# Patient Record
Sex: Male | Born: 1940
Health system: Southern US, Community
[De-identification: ages and names within clinical notes are randomized; demographics above are authoritative.]

## PROBLEM LIST (undated history)

## (undated) DIAGNOSIS — E785 Hyperlipidemia, unspecified: Secondary | ICD-10-CM

## (undated) DIAGNOSIS — Z86010 Personal history of colonic polyps: Secondary | ICD-10-CM

## (undated) DIAGNOSIS — M109 Gout, unspecified: Secondary | ICD-10-CM

## (undated) DIAGNOSIS — H269 Unspecified cataract: Secondary | ICD-10-CM

## (undated) DIAGNOSIS — K5790 Diverticulosis of intestine, part unspecified, without perforation or abscess without bleeding: Secondary | ICD-10-CM

## (undated) DIAGNOSIS — I10 Essential (primary) hypertension: Secondary | ICD-10-CM

## (undated) DIAGNOSIS — E079 Disorder of thyroid, unspecified: Secondary | ICD-10-CM

## (undated) DIAGNOSIS — R972 Elevated prostate specific antigen [PSA]: Secondary | ICD-10-CM

## (undated) DIAGNOSIS — R7303 Prediabetes: Secondary | ICD-10-CM

## (undated) DIAGNOSIS — N400 Enlarged prostate without lower urinary tract symptoms: Secondary | ICD-10-CM

## (undated) DIAGNOSIS — D369 Benign neoplasm, unspecified site: Secondary | ICD-10-CM

## (undated) DIAGNOSIS — K429 Umbilical hernia without obstruction or gangrene: Secondary | ICD-10-CM

## (undated) HISTORY — DX: Diverticulosis of intestine, part unspecified, without perforation or abscess without bleeding: K57.90

## (undated) HISTORY — PX: SHOULDER ARTHROSCOPY: SHX128

## (undated) HISTORY — DX: Elevated prostate specific antigen (PSA): R97.20

## (undated) HISTORY — DX: Prediabetes: R73.03

## (undated) HISTORY — DX: Umbilical hernia without obstruction or gangrene: K42.9

## (undated) HISTORY — DX: Unspecified cataract: H26.9

## (undated) HISTORY — DX: Disorder of thyroid, unspecified: E07.9

## (undated) HISTORY — DX: Benign prostatic hyperplasia without lower urinary tract symptoms: N40.0

## (undated) HISTORY — DX: Essential (primary) hypertension: I10

## (undated) HISTORY — DX: Personal history of colonic polyps: Z86.010

## (undated) HISTORY — DX: Hyperlipidemia, unspecified: E78.5

## (undated) HISTORY — PX: COLONOSCOPY W/ BIOPSIES: SHX1374

## (undated) HISTORY — DX: Gout, unspecified: M10.9

## (undated) HISTORY — DX: Benign neoplasm, unspecified site: D36.9

---

## 1999-04-22 ENCOUNTER — Encounter: Payer: Self-pay | Admitting: Emergency Medicine

## 1999-04-22 ENCOUNTER — Emergency Department (HOSPITAL_COMMUNITY): Admission: EM | Admit: 1999-04-22 | Discharge: 1999-04-22 | Payer: Self-pay | Admitting: Emergency Medicine

## 2003-02-17 DIAGNOSIS — D369 Benign neoplasm, unspecified site: Secondary | ICD-10-CM

## 2003-02-17 HISTORY — DX: Benign neoplasm, unspecified site: D36.9

## 2003-11-04 ENCOUNTER — Emergency Department (HOSPITAL_COMMUNITY): Admission: EM | Admit: 2003-11-04 | Discharge: 2003-11-04 | Payer: Self-pay | Admitting: Emergency Medicine

## 2003-11-13 ENCOUNTER — Encounter: Admission: RE | Admit: 2003-11-13 | Discharge: 2003-11-13 | Payer: Self-pay | Admitting: Family Medicine

## 2005-03-26 IMAGING — CT CT HEAD W/ CM
1 series · 16 of 28 positions shown, 20 images · IV contrast (75 ML OMNI 300)
Comparison: none

CLINICAL DATA: Dizziness, episode of slurred speech and right sided weakness.
 CT BRAIN WITH CONTRAST
 The patient was done with contrast ? 75 cc of Omnipaque 300.  It is compared to an unenhanced CT dated 11/04/03 from[HOSPITAL].
 Only normal vascular structures appear to enhance.  No evidence for CVA or focal lesion.    If symptoms continue, one might consider an enhanced MRI scan of the brain to rule out occult lesions that are not visible by CT. 
 IMPRESSION
 Normal enhanced CT of the brain.

[Series 2: brain · axial · 0.49mm/px · z∈[+43,+174]mm · 16 of 28 slices shown, 20 images]
[im 2/28  brain]
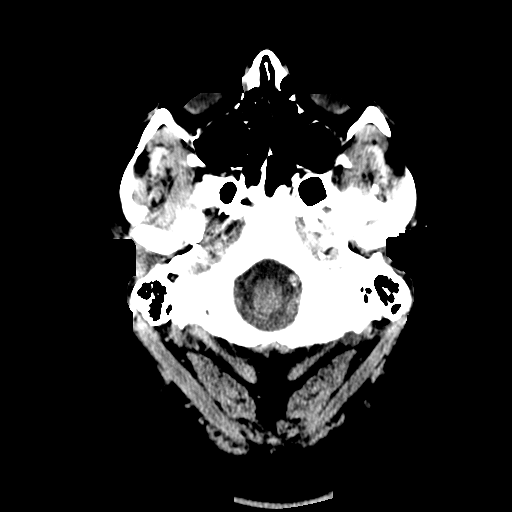
[im 2/28  bone]
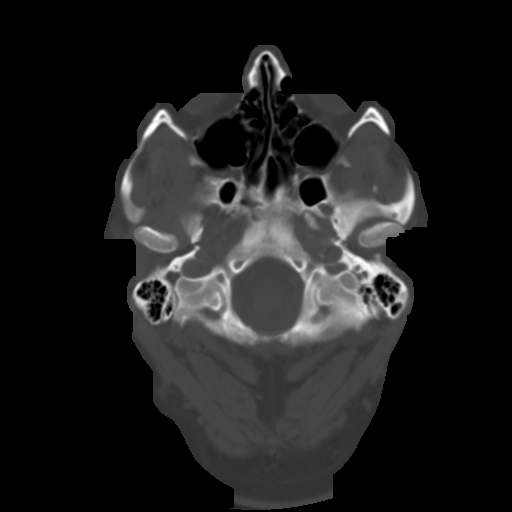
[im 4/28  brain]
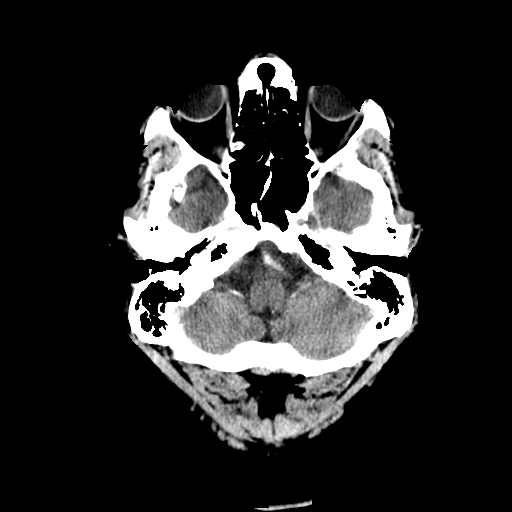
[im 6/28  brain]
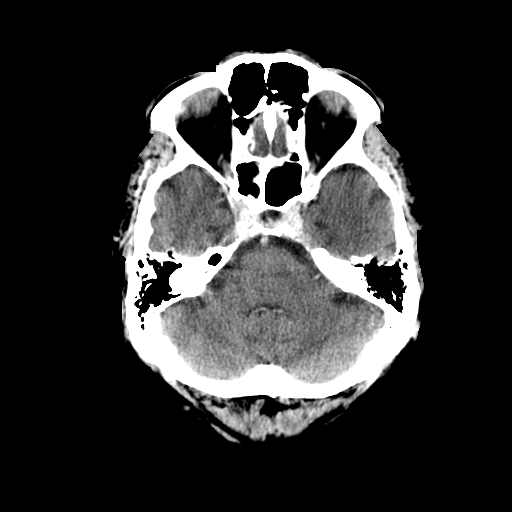
[im 7/28  brain]
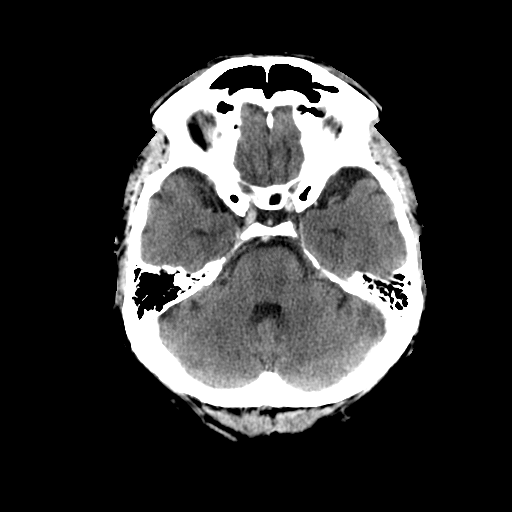
[im 9/28  brain]
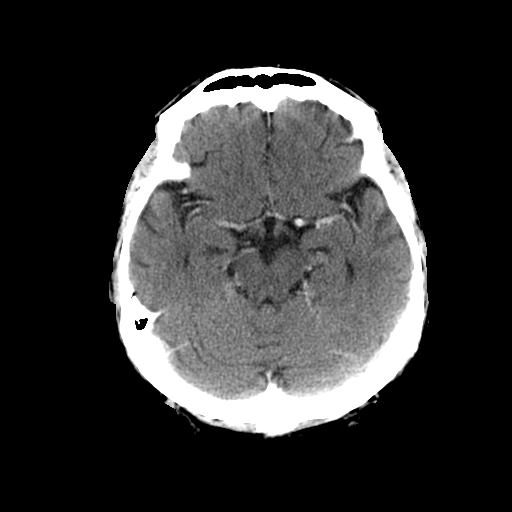
[im 9/28  bone]
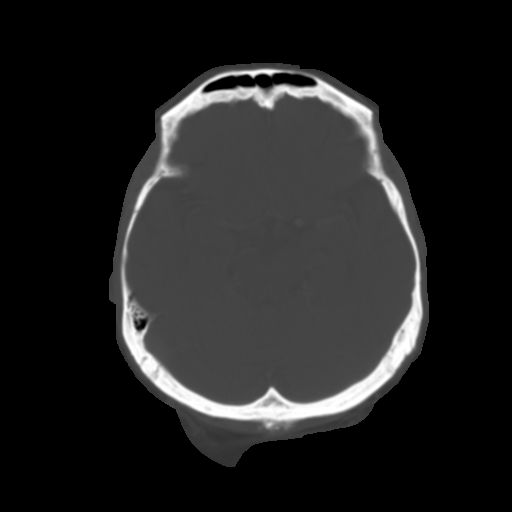
[im 10/28  brain]
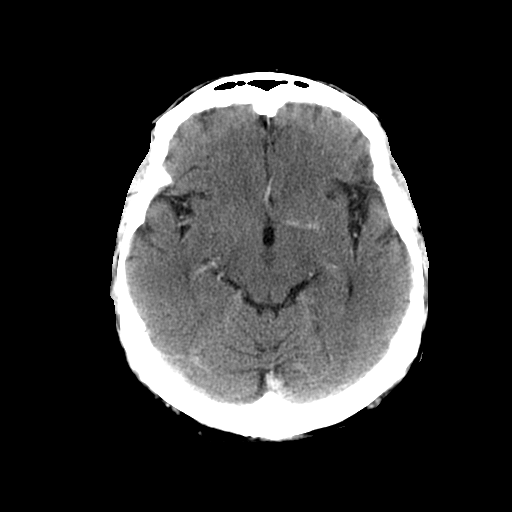
[im 12/28  brain]
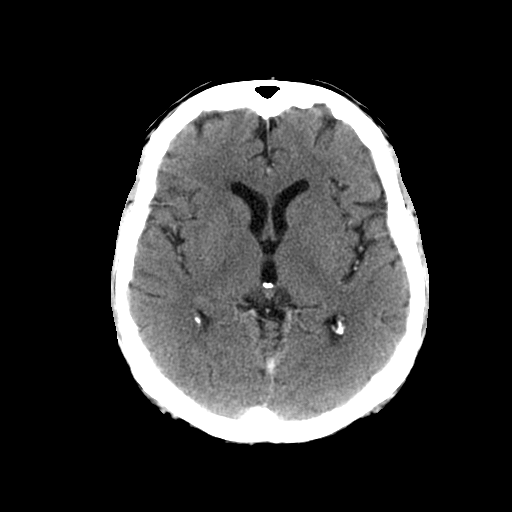
[im 14/28  brain]
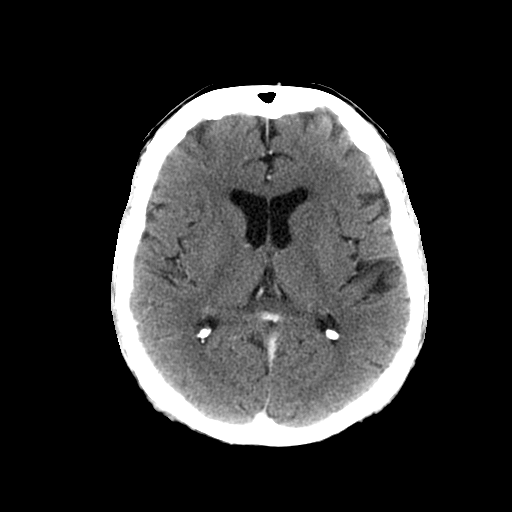
[im 15/28  brain]
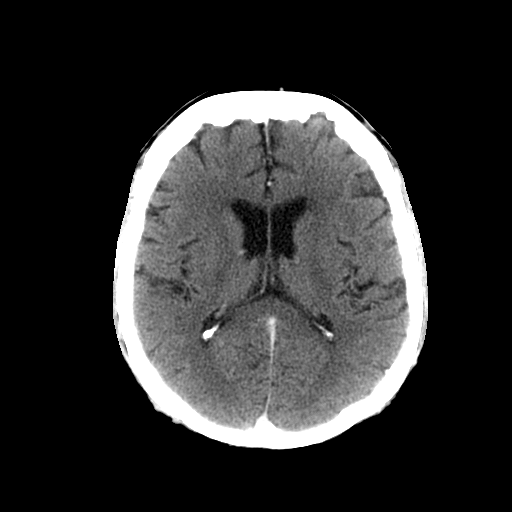
[im 15/28  bone]
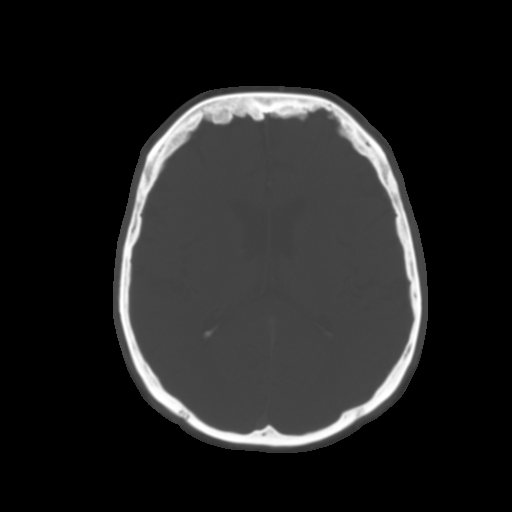
[im 17/28  brain]
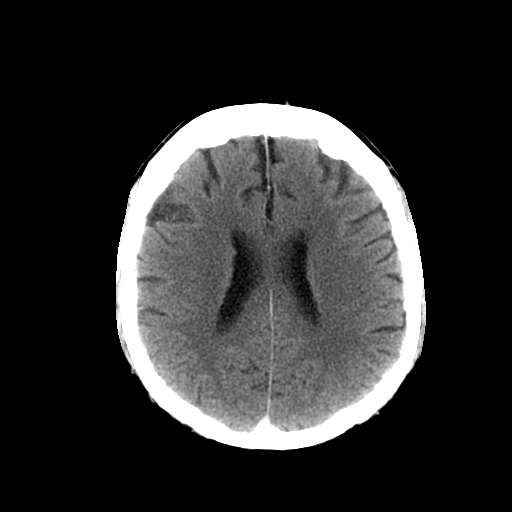
[im 19/28  brain]
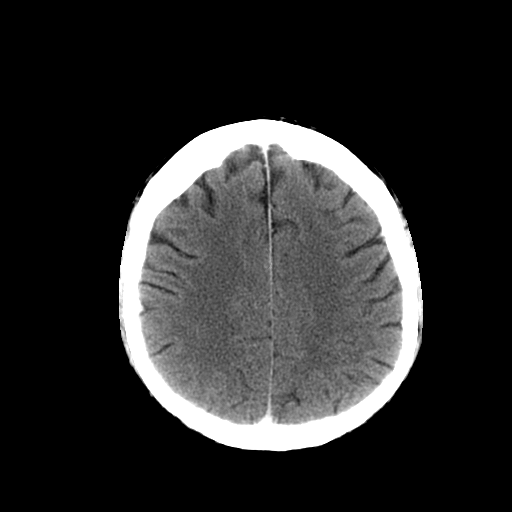
[im 20/28  brain]
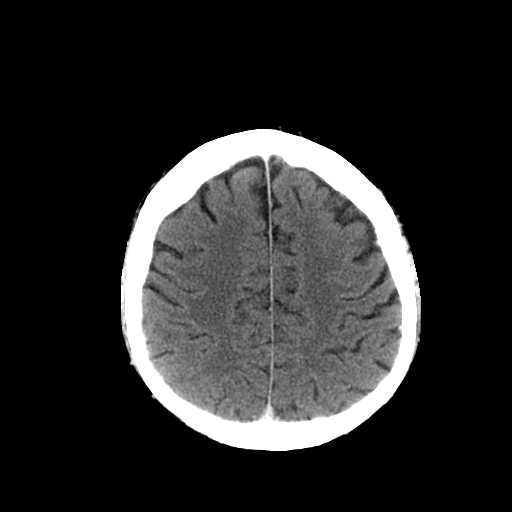
[im 22/28  brain]
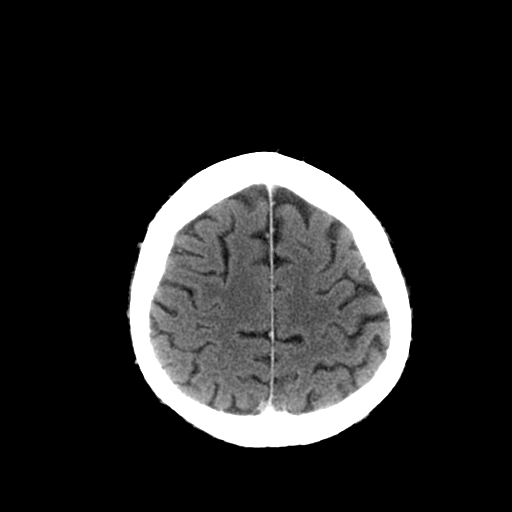
[im 22/28  bone]
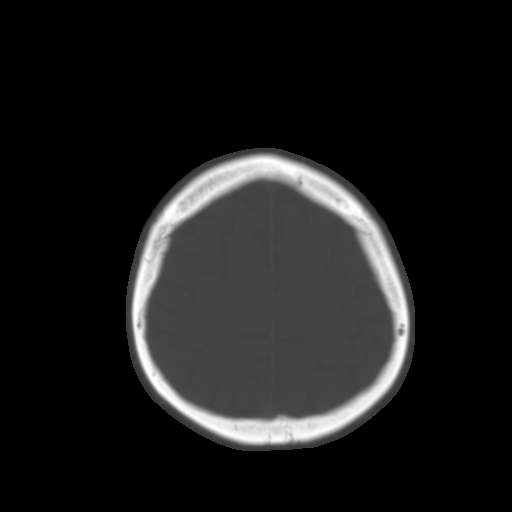
[im 23/28  brain]
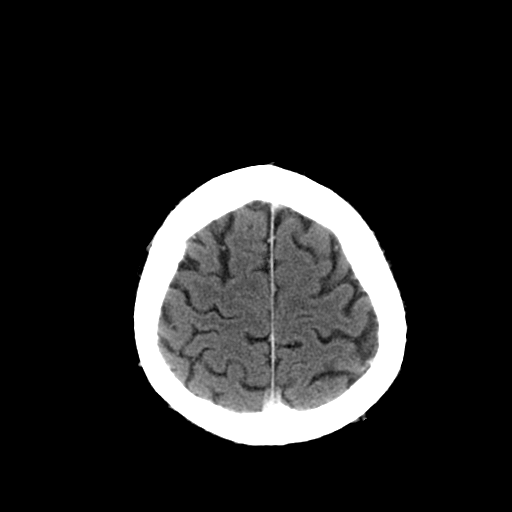
[im 25/28  brain]
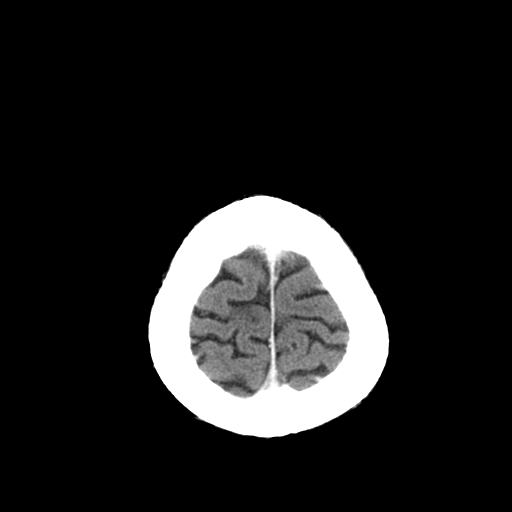
[im 27/28  brain]
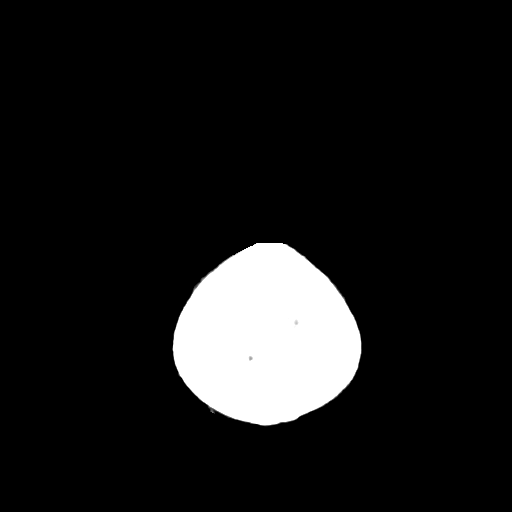

[16 of 28 positions shown; findings below may reference images not displayed]

## 2005-07-04 ENCOUNTER — Ambulatory Visit: Payer: Self-pay | Admitting: Family Medicine

## 2005-10-22 ENCOUNTER — Emergency Department (HOSPITAL_COMMUNITY): Admission: EM | Admit: 2005-10-22 | Discharge: 2005-10-22 | Payer: Self-pay | Admitting: Emergency Medicine

## 2007-05-30 HISTORY — PX: CATARACT EXTRACTION: SUR2

## 2010-12-14 ENCOUNTER — Other Ambulatory Visit: Payer: Self-pay | Admitting: Internal Medicine

## 2010-12-14 DIAGNOSIS — R748 Abnormal levels of other serum enzymes: Secondary | ICD-10-CM

## 2010-12-16 ENCOUNTER — Ambulatory Visit
Admission: RE | Admit: 2010-12-16 | Discharge: 2010-12-16 | Disposition: A | Discharge status: Home / Self Care | Payer: Medicare Other | Source: Ambulatory Visit | Attending: Internal Medicine | Admitting: Internal Medicine

## 2010-12-16 ENCOUNTER — Other Ambulatory Visit: Payer: Self-pay | Admitting: Internal Medicine

## 2010-12-16 ENCOUNTER — Other Ambulatory Visit: Payer: Self-pay | Admitting: Emergency Medicine

## 2010-12-16 DIAGNOSIS — R748 Abnormal levels of other serum enzymes: Secondary | ICD-10-CM

## 2012-06-11 ENCOUNTER — Other Ambulatory Visit: Payer: Self-pay | Admitting: Internal Medicine

## 2012-06-11 DIAGNOSIS — R7989 Other specified abnormal findings of blood chemistry: Secondary | ICD-10-CM

## 2012-06-13 ENCOUNTER — Ambulatory Visit
Admission: RE | Admit: 2012-06-13 | Discharge: 2012-06-13 | Disposition: A | Discharge status: Home / Self Care | Payer: Medicare Other | Source: Ambulatory Visit | Attending: Internal Medicine | Admitting: Internal Medicine

## 2012-06-13 DIAGNOSIS — R7989 Other specified abnormal findings of blood chemistry: Secondary | ICD-10-CM

## 2012-09-19 ENCOUNTER — Encounter: Payer: Self-pay | Admitting: Internal Medicine

## 2012-10-29 ENCOUNTER — Ambulatory Visit (AMBULATORY_SURGERY_CENTER): Payer: Medicare Other | Admitting: *Deleted

## 2012-10-29 ENCOUNTER — Encounter: Payer: Self-pay | Admitting: Internal Medicine

## 2012-10-29 VITALS — Ht 69.0 in | Wt 242.0 lb

## 2012-10-29 DIAGNOSIS — Z1211 Encounter for screening for malignant neoplasm of colon: Secondary | ICD-10-CM

## 2012-10-29 MED ORDER — NA SULFATE-K SULFATE-MG SULF 17.5-3.13-1.6 GM/177ML PO SOLN: 1.0000 | Freq: Once | ORAL | Status: DC

## 2012-10-29 NOTE — Progress Notes (Signed)
Denies allergies to eggs or soy products. Denies complications with anesthesia or sedation. 

## 2012-11-12 ENCOUNTER — Encounter: Payer: Medicare Other | Admitting: Internal Medicine

## 2013-05-12 ENCOUNTER — Other Ambulatory Visit: Payer: Self-pay | Admitting: Internal Medicine

## 2013-05-12 MED ORDER — ALLOPURINOL 300 MG PO TABS: 300.0000 mg | ORAL_TABLET | Freq: Every day | ORAL | Status: DC

## 2013-06-06 ENCOUNTER — Encounter: Payer: Self-pay | Admitting: Physician Assistant

## 2013-06-06 ENCOUNTER — Ambulatory Visit (INDEPENDENT_AMBULATORY_CARE_PROVIDER_SITE_OTHER): Payer: Medicare Other | Admitting: Physician Assistant

## 2013-06-06 VITALS — BP 136/82 | HR 68 | Temp 97.7°F | Resp 16 | Wt 241.8 lb

## 2013-06-06 DIAGNOSIS — J209 Acute bronchitis, unspecified: Secondary | ICD-10-CM

## 2013-06-06 MED ORDER — AZITHROMYCIN 250 MG PO TABS: ORAL_TABLET | ORAL | Status: DC

## 2013-06-06 MED ORDER — PREDNISONE 20 MG PO TABS: ORAL_TABLET | ORAL | Status: DC

## 2013-06-06 NOTE — Patient Instructions (Signed)

## 2013-06-06 NOTE — Progress Notes (Signed)
   Subjective:    Patient ID: Andrew Cochran, male    DOB: Nov 28, 1940, 73 y.o.   MRN: [id number]  Cough This is a new problem. Episode onset: 2 weeks. The problem has been unchanged. The cough is productive of purulent sputum. Associated symptoms include ear congestion (Right ear), ear pain, nasal congestion and postnasal drip. Pertinent negatives include no chest pain, chills, fever, headaches, heartburn, hemoptysis, myalgias, rash, rhinorrhea, sore throat, shortness of breath, sweats, weight loss or wheezing. Nothing aggravates the symptoms. The treatment provided no relief.    Current Outpatient Prescriptions on File Prior to Visit  Medication Sig Dispense Refill  . allopurinol (ZYLOPRIM) 300 MG tablet Take 1 tablet (300 mg total) by mouth daily. To prevent gout  90 tablet  99  . atenolol (TENORMIN) 100 MG tablet Take 100 mg by mouth daily.      Marland Kitchen levothyroxine (SYNTHROID, LEVOTHROID) 200 MCG tablet Take 200 mcg by mouth daily before breakfast.      . Na Sulfate-K Sulfate-Mg Sulf SOLN Take 1 kit by mouth once. suprep as directed. No substitutions.  354 mL  0   No current facility-administered medications on file prior to visit.   Past Medical History  Diagnosis Date  . Gout   . Cataracts, bilateral   . Hypertension   . Thyroid disease     Review of Systems  Constitutional: Negative for fever, chills and weight loss.  HENT: Positive for ear pain and postnasal drip. Negative for rhinorrhea and sore throat.   Respiratory: Positive for cough. Negative for hemoptysis, shortness of breath and wheezing.   Cardiovascular: Negative for chest pain.  Gastrointestinal: Negative for heartburn.  Musculoskeletal: Negative for myalgias.  Skin: Negative for rash.  Neurological: Negative for headaches.       Objective:   Physical Exam  Constitutional: He is oriented to person, place, and time. He appears well-developed and well-nourished.  HENT:  Head: Normocephalic and atraumatic.  Right  Ear: External ear normal.  Left Ear: External ear normal.  Nose: Nose normal.  Mouth/Throat: Oropharynx is clear and moist.  Eyes: Conjunctivae are normal. Pupils are equal, round, and reactive to light.  Neck: Normal range of motion. Neck supple.  Cardiovascular: Normal rate, regular rhythm and normal heart sounds.   No murmur heard. Pulmonary/Chest: Effort normal. No respiratory distress. He has wheezes. He has no rales. He exhibits no tenderness.  Abdominal: Soft. Bowel sounds are normal.  Lymphadenopathy:    He has no cervical adenopathy.  Neurological: He is alert and oriented to person, place, and time.  Skin: Skin is warm and dry.      Assessment & Plan:  Acute bronchitis - Plan: azithromycin (ZITHROMAX) 250 MG tablet, predniSONE (DELTASONE) 20 MG tablet

## 2013-06-10 ENCOUNTER — Ambulatory Visit (INDEPENDENT_AMBULATORY_CARE_PROVIDER_SITE_OTHER): Payer: Medicare Other | Admitting: *Deleted

## 2013-06-10 DIAGNOSIS — H6121 Impacted cerumen, right ear: Secondary | ICD-10-CM

## 2013-06-10 DIAGNOSIS — H6120 Impacted cerumen, unspecified ear: Secondary | ICD-10-CM

## 2013-06-10 NOTE — Progress Notes (Signed)
Patient ID: Andrew Cochran, male   DOB: 1940-09-04, 73 y.o.   MRN: [id number] Patient presented in office for nurse visit for ear lavage of right ear to clear cerumen impaction.  Patient tolerated well.  Successful lavage, clearing ear of impaction and providing patient with relief from symptoms.  Advised patient to call if any concerns or reoccurrence of symptoms.

## 2013-07-11 ENCOUNTER — Other Ambulatory Visit: Payer: Self-pay | Admitting: Physician Assistant

## 2013-07-11 MED ORDER — HYDROCODONE-ACETAMINOPHEN 5-325 MG PO TABS: 1.0000 | ORAL_TABLET | Freq: Four times a day (QID) | ORAL | Status: DC | PRN

## 2013-08-01 ENCOUNTER — Telehealth: Payer: Self-pay | Admitting: *Deleted

## 2013-08-01 NOTE — Telephone Encounter (Signed)
Patient called and requested RX for Tussinex but denied by Dr Melford Aase because it is addictive.  Per Dr Melford Aase, try OTC Delsym.

## 2013-08-05 ENCOUNTER — Other Ambulatory Visit: Payer: Self-pay

## 2013-08-05 MED ORDER — ATENOLOL 100 MG PO TABS: 100.0000 mg | ORAL_TABLET | Freq: Every day | ORAL | Status: DC

## 2013-08-14 ENCOUNTER — Other Ambulatory Visit: Payer: Self-pay | Admitting: Internal Medicine

## 2013-08-20 ENCOUNTER — Other Ambulatory Visit: Payer: Self-pay | Admitting: Internal Medicine

## 2013-08-20 DIAGNOSIS — I10 Essential (primary) hypertension: Secondary | ICD-10-CM

## 2013-08-20 DIAGNOSIS — Z79899 Other long term (current) drug therapy: Secondary | ICD-10-CM

## 2013-08-20 DIAGNOSIS — E559 Vitamin D deficiency, unspecified: Secondary | ICD-10-CM

## 2013-08-20 DIAGNOSIS — Z125 Encounter for screening for malignant neoplasm of prostate: Secondary | ICD-10-CM

## 2013-08-20 DIAGNOSIS — R7309 Other abnormal glucose: Secondary | ICD-10-CM

## 2013-08-20 DIAGNOSIS — E782 Mixed hyperlipidemia: Secondary | ICD-10-CM

## 2013-09-04 ENCOUNTER — Other Ambulatory Visit: Payer: Self-pay

## 2013-09-21 DIAGNOSIS — I10 Essential (primary) hypertension: Secondary | ICD-10-CM | POA: Insufficient documentation

## 2013-09-21 DIAGNOSIS — M109 Gout, unspecified: Secondary | ICD-10-CM | POA: Insufficient documentation

## 2013-09-21 DIAGNOSIS — R972 Elevated prostate specific antigen [PSA]: Secondary | ICD-10-CM

## 2013-09-21 DIAGNOSIS — E039 Hypothyroidism, unspecified: Secondary | ICD-10-CM | POA: Insufficient documentation

## 2013-09-21 DIAGNOSIS — E785 Hyperlipidemia, unspecified: Secondary | ICD-10-CM | POA: Insufficient documentation

## 2013-09-21 DIAGNOSIS — H269 Unspecified cataract: Secondary | ICD-10-CM | POA: Insufficient documentation

## 2013-09-21 DIAGNOSIS — N400 Enlarged prostate without lower urinary tract symptoms: Secondary | ICD-10-CM | POA: Insufficient documentation

## 2013-09-25 ENCOUNTER — Ambulatory Visit (INDEPENDENT_AMBULATORY_CARE_PROVIDER_SITE_OTHER): Payer: Medicare Other | Admitting: Internal Medicine

## 2013-09-25 ENCOUNTER — Encounter: Payer: Self-pay | Admitting: Internal Medicine

## 2013-09-25 VITALS — BP 118/74 | HR 64 | Temp 97.9°F | Resp 16 | Ht 67.5 in | Wt 236.8 lb

## 2013-09-25 DIAGNOSIS — Z1212 Encounter for screening for malignant neoplasm of rectum: Secondary | ICD-10-CM

## 2013-09-25 DIAGNOSIS — Z0000 Encounter for general adult medical examination without abnormal findings: Secondary | ICD-10-CM

## 2013-09-25 DIAGNOSIS — E785 Hyperlipidemia, unspecified: Secondary | ICD-10-CM

## 2013-09-25 DIAGNOSIS — R7309 Other abnormal glucose: Secondary | ICD-10-CM

## 2013-09-25 DIAGNOSIS — I10 Essential (primary) hypertension: Secondary | ICD-10-CM

## 2013-09-25 DIAGNOSIS — E559 Vitamin D deficiency, unspecified: Secondary | ICD-10-CM | POA: Insufficient documentation

## 2013-09-25 DIAGNOSIS — Z789 Other specified health status: Secondary | ICD-10-CM

## 2013-09-25 DIAGNOSIS — Z1331 Encounter for screening for depression: Secondary | ICD-10-CM

## 2013-09-25 DIAGNOSIS — Z79899 Other long term (current) drug therapy: Secondary | ICD-10-CM | POA: Insufficient documentation

## 2013-09-25 DIAGNOSIS — Z125 Encounter for screening for malignant neoplasm of prostate: Secondary | ICD-10-CM

## 2013-09-25 LAB — CBC WITH DIFFERENTIAL/PLATELET
BASOS ABS: 0 10*3/uL (ref 0.0–0.1)
BASOS PCT: 0 % (ref 0–1)
EOS ABS: 0.2 10*3/uL (ref 0.0–0.7)
Eosinophils Relative: 3 % (ref 0–5)
HCT: 45.4 % (ref 39.0–52.0)
HEMOGLOBIN: 16 g/dL (ref 13.0–17.0)
Lymphocytes Relative: 24 % (ref 12–46)
Lymphs Abs: 1.6 10*3/uL (ref 0.7–4.0)
MCH: 33.5 pg (ref 26.0–34.0)
MCHC: 35.2 g/dL (ref 30.0–36.0)
MCV: 95 fL (ref 78.0–100.0)
MONOS PCT: 7 % (ref 3–12)
Monocytes Absolute: 0.5 10*3/uL (ref 0.1–1.0)
NEUTROS ABS: 4.4 10*3/uL (ref 1.7–7.7)
NEUTROS PCT: 66 % (ref 43–77)
PLATELETS: 204 10*3/uL (ref 150–400)
RBC: 4.78 MIL/uL (ref 4.22–5.81)
RDW: 13.5 % (ref 11.5–15.5)
WBC: 6.7 10*3/uL (ref 4.0–10.5)

## 2013-09-25 LAB — HEMOGLOBIN A1C
HEMOGLOBIN A1C: 5.3 % (ref <5.7)
Mean Plasma Glucose: 105 mg/dL (ref <117)

## 2013-09-25 NOTE — Progress Notes (Signed)
Patient ID: Andrew Cochran, male   DOB: [birth year], 73 y.o.   MRN: [id number]   Annual Screening Comprehensive Examination  This very nice 73 y.o.  MWM presents for complete physical.  Patient has been followed for HTN, Diabetes  Prediabetes, Hyperlipidemia, and Vitamin D Deficiency.   HTN predates since 1997. Patient's BP has been controlled at home.Today's BP: 118/74 mmHg. Patient denies any cardiac symptoms as chest pain, palpitations, shortness of breath, dizziness or ankle swelling.   Patient's hyperlipidemia is not controlled with diet and medications. Patient denies myalgias or other medication SE's. Last cholesterol last visit was 196, triglycerides 116, HDL 52 and LDL 121 in Oct 2014 - not at goal.     Patient has prediabetessince Apr 2011 with A1c of 6.4% and  last A1c was 5.3% in Oct 2014. Patient denies reactive hypoglycemic symptoms, visual blurring, diabetic polys, or paresthesias.    Finally, patient has history of Vitamin D Deficiency of 36 in 2008 and last vitamin D was 85 in Oct 2014.  Medication Sig  . allopurinol (ZYLOPRIM) 300 MG tab Take 1 tablet (300 mg total) by mouth daily. To prevent gout  . Ascorbic Acid (VITAMIN C ) Take 2,000 mg by mouth daily.   Marland Kitchen atenolol (TENORMIN) 100 MG tab Take 1 tablet (100 mg total) by mouth daily.  Marland Kitchen BABY ASPIRIN  Take 81 mg by mouth daily.  . Cholecalciferol (VITAMIN D ) Take 10,000 Int'l Units by mouth daily.  Marland Kitchen levothyroxine  200 MCG tab TAKE ONE AND ONE-HALF TABLETS BY MOUTH EVERY DAY  . MAGNESIUM PO Take 250 mg by mouth 3 (three) times daily.   Marland Kitchen FISH OIL  Take 1,000 mg by mouth daily.   Marland Kitchen PREDNISONE  Take 10 mg by mouth.  . Red Yeast Rice Extract  Take 1,000 mg by mouth daily.    No Known Allergies  Past Medical History  Diagnosis Date  . Gout   . Cataracts, bilateral   . Hypertension   . Thyroid disease   . BPH with elevated PSA   . Prediabetes   . Hyperlipidemia     Past Surgical History  Procedure Laterality Date   . Cataract extraction Right 2009  . Shoulder arthroscopy Right     1979    Family History  Problem Relation Age of Onset  . Colon cancer Neg Hx   . Esophageal cancer Neg Hx   . Rectal cancer Neg Hx   . Stomach cancer Neg Hx   . Cancer Mother   . COPD Father     History   Social History  . Marital Status: Married    Spouse Name: N/A    Number of Children: N/A  . Years of Education: N/A   Occupational History  . Retired Freight forwarder   Social History Main Topics  . Smoking status: Former Smoker    Quit date: 05/30/1959  . Smokeless tobacco: Never Used  . Alcohol Use: 8.4 oz/week    14 Glasses of wine per week     Comment: occasional  . Drug Use: No  . Sexual Activity: Not on file     ROS Constitutional: Denies fever, chills, weight loss/gain, headaches, insomnia, fatigue, night sweats, and change in appetite. Eyes: Denies redness, blurred vision, diplopia, discharge, itchy, watery eyes.  ENT: Denies discharge, congestion, post nasal drip, epistaxis, sore throat, earache, hearing loss, dental pain, Tinnitus, Vertigo, Sinus pain, snoring.  Cardio: Denies chest pain, palpitations, irregular heartbeat, syncope, dyspnea, diaphoresis, orthopnea, PND, claudication,  edema Respiratory: denies cough, dyspnea, DOE, pleurisy, hoarseness, laryngitis, wheezing.  Gastrointestinal: Denies dysphagia, heartburn, reflux, water brash, pain, cramps, nausea, vomiting, bloating, diarrhea, constipation, hematemesis, melena, hematochezia, jaundice, hemorrhoids Genitourinary: Denies dysuria, frequency, urgency, nocturia, hesitancy, discharge, hematuria, flank pain Musculoskeletal: Denies arthralgia, myalgia, stiffness, Jt. Swelling, pain, limp, and strain/sprain. Skin: Denies puritis, rash, hives, warts, acne, eczema, changing in skin lesion Neuro: No weakness, tremor, incoordination, spasms, paresthesia, pain Psychiatric: Denies confusion, memory loss, sensory loss Endocrine: Denies change in  weight, skin, hair change, nocturia, and paresthesia, diabetic polys, visual blurring, hyper / hypo glycemic episodes.  Heme/Lymph: No excessive bleeding, bruising, or elarged lymph nodes.  Physical Exam  BP 118/74  Pulse 64  Temp 97.9 F  Resp 16  Ht 5' 7.5"   Wt 236 lb 12.8 oz   BMI 36.52 kg/m2  General Appearance: Over nourished and Obese and in no apparent distress. Eyes: PERRLA, EOMs, conjunctiva no swelling or erythema, normal fundi and vessels. Sinuses: No frontal/maxillary tenderness ENT/Mouth: EACs patent / TMs  nl. Nares clear without erythema, swelling, mucoid exudates. Oral hygiene is good. No erythema, swelling, or exudate. Tongue normal, non-obstructing. Tonsils not swollen or erythematous. Hearing normal.  Neck: Supple, thyroid normal. No bruits, nodes or JVD. Respiratory: Respiratory effort normal.  BS equal and clear bilateral without rales, rhonci, wheezing or stridor. Cardio: Heart sounds are normal with regular rate and rhythm and no murmurs, rubs or gallops. Peripheral pulses are normal and equal bilaterally without edema. No aortic or femoral bruits. Chest: symmetric with normal excursions and percussion.  Abdomen: Flat, soft, with bowl sounds. Nontender, no guarding, rebound, hernias, masses, or organomegaly.  Lymphatics: Non tender without lymphadenopathy.  Genitourinary: No hernias.Testes nl. DRE - prostate nl for age - smooth & firm w/o nodules. Musculoskeletal: Full ROM all peripheral extremities, joint stability, 5/5 strength, and normal gait. Skin: Warm and dry without rashes, lesions, cyanosis, clubbing or  ecchymosis.  Neuro: Cranial nerves intact, reflexes equal bilaterally. Normal muscle tone, no cerebellar symptoms. Sensation intact.  Pysch: Awake and oriented X 3, normal affect, insight and judgment appropriate.   Assessment and Plan  1. Annual Screening Examination 2. Hypertension  3. Hyperlipidemia 4. Pre Diabetes 5. Vitamin D  Deficiency  Continue prudent diet as discussed, weight control, BP monitoring, regular exercise, and medications as discussed.  Discussed med effects and SE's. Routine screening labs and tests as requested with regular follow-up as recommended.

## 2013-09-25 NOTE — Patient Instructions (Signed)

## 2013-09-26 LAB — BASIC METABOLIC PANEL WITH GFR
BUN: 13 mg/dL (ref 6–23)
CALCIUM: 10 mg/dL (ref 8.4–10.5)
CO2: 26 mEq/L (ref 19–32)
Chloride: 100 mEq/L (ref 96–112)
Creat: 0.75 mg/dL (ref 0.50–1.35)
GLUCOSE: 109 mg/dL — AB (ref 70–99)
POTASSIUM: 4.6 meq/L (ref 3.5–5.3)
SODIUM: 139 meq/L (ref 135–145)

## 2013-09-26 LAB — INSULIN, FASTING: INSULIN FASTING, SERUM: 15 u[IU]/mL (ref 3–28)

## 2013-09-26 LAB — URINALYSIS, MICROSCOPIC ONLY
BACTERIA UA: NONE SEEN
CRYSTALS: NONE SEEN
Casts: NONE SEEN
SQUAMOUS EPITHELIAL / LPF: NONE SEEN

## 2013-09-26 LAB — TSH: TSH: 0.634 u[IU]/mL (ref 0.350–4.500)

## 2013-09-26 LAB — MICROALBUMIN / CREATININE URINE RATIO
CREATININE, URINE: 182.9 mg/dL
MICROALB/CREAT RATIO: 274.8 mg/g — AB (ref 0.0–30.0)
Microalb, Ur: 50.27 mg/dL — ABNORMAL HIGH (ref 0.00–1.89)

## 2013-09-26 LAB — HEPATIC FUNCTION PANEL
ALT: 148 U/L — AB (ref 0–53)
AST: 108 U/L — ABNORMAL HIGH (ref 0–37)
Albumin: 4.2 g/dL (ref 3.5–5.2)
Alkaline Phosphatase: 92 U/L (ref 39–117)
BILIRUBIN DIRECT: 0.3 mg/dL (ref 0.0–0.3)
BILIRUBIN INDIRECT: 0.8 mg/dL (ref 0.2–1.2)
BILIRUBIN TOTAL: 1.1 mg/dL (ref 0.2–1.2)
Total Protein: 7.1 g/dL (ref 6.0–8.3)

## 2013-09-26 LAB — LIPID PANEL
CHOL/HDL RATIO: 4.5 ratio
CHOLESTEROL: 199 mg/dL (ref 0–200)
HDL: 44 mg/dL (ref >39)
LDL Cholesterol: 128 mg/dL — ABNORMAL HIGH (ref 0–99)
TRIGLYCERIDES: 133 mg/dL (ref <150)
VLDL: 27 mg/dL (ref 0–40)

## 2013-09-26 LAB — PSA: PSA: 2.74 ng/mL (ref <=4.00)

## 2013-09-26 LAB — MAGNESIUM: MAGNESIUM: 1.5 mg/dL (ref 1.5–2.5)

## 2013-09-26 LAB — VITAMIN D 25 HYDROXY (VIT D DEFICIENCY, FRACTURES): VIT D 25 HYDROXY: 97 ng/mL — AB (ref 30–89)

## 2013-09-27 ENCOUNTER — Other Ambulatory Visit: Payer: Self-pay | Admitting: Internal Medicine

## 2013-09-28 ENCOUNTER — Other Ambulatory Visit: Payer: Self-pay | Admitting: Internal Medicine

## 2013-09-28 MED ORDER — LISINOPRIL 40 MG PO TABS: 40.0000 mg | ORAL_TABLET | Freq: Every day | ORAL | Status: DC

## 2013-10-13 ENCOUNTER — Other Ambulatory Visit (INDEPENDENT_AMBULATORY_CARE_PROVIDER_SITE_OTHER): Payer: Medicare Other

## 2013-10-13 DIAGNOSIS — Z1212 Encounter for screening for malignant neoplasm of rectum: Secondary | ICD-10-CM

## 2013-10-13 LAB — POC HEMOCCULT BLD/STL (HOME/3-CARD/SCREEN)
Card #2 Fecal Occult Blod, POC: NEGATIVE
FECAL OCCULT BLD: NEGATIVE
Fecal Occult Blood, POC: NEGATIVE

## 2013-10-24 ENCOUNTER — Telehealth: Payer: Self-pay | Admitting: *Deleted

## 2013-10-24 MED ORDER — LOSARTAN POTASSIUM 100 MG PO TABS: ORAL_TABLET | ORAL | Status: DC

## 2013-10-24 NOTE — Telephone Encounter (Signed)
Patient called and states he has developed a cough on Lisinopril.  Per Dr Melford Aase, D/C lisinopril and new RX Losartan 100 mg sent to pharmacy.  Patient advised to start with 1/2 tab and check BP.

## 2013-11-12 ENCOUNTER — Telehealth: Payer: Self-pay | Admitting: *Deleted

## 2013-11-12 NOTE — Telephone Encounter (Signed)
Patient called and states he stopped Lisinopril and started new RX for Losartan, but still has dry cough not relieved by cough syrup.

## 2013-11-13 ENCOUNTER — Telehealth: Payer: Self-pay | Admitting: *Deleted

## 2013-11-13 ENCOUNTER — Other Ambulatory Visit: Payer: Self-pay | Admitting: Internal Medicine

## 2013-11-13 ENCOUNTER — Ambulatory Visit (HOSPITAL_COMMUNITY)
Admission: RE | Admit: 2013-11-13 | Discharge: 2013-11-13 | Disposition: A | Discharge status: Home / Self Care | Payer: Medicare Other | Source: Ambulatory Visit | Attending: Internal Medicine | Admitting: Internal Medicine

## 2013-11-13 ENCOUNTER — Encounter: Payer: Self-pay | Admitting: Physician Assistant

## 2013-11-13 ENCOUNTER — Encounter (INDEPENDENT_AMBULATORY_CARE_PROVIDER_SITE_OTHER): Payer: Self-pay

## 2013-11-13 ENCOUNTER — Ambulatory Visit (INDEPENDENT_AMBULATORY_CARE_PROVIDER_SITE_OTHER): Payer: Medicare Other | Admitting: Physician Assistant

## 2013-11-13 VITALS — BP 130/78 | HR 80 | Temp 97.5°F | Resp 16 | Wt 239.8 lb

## 2013-11-13 DIAGNOSIS — R05 Cough: Secondary | ICD-10-CM

## 2013-11-13 DIAGNOSIS — R059 Cough, unspecified: Secondary | ICD-10-CM

## 2013-11-13 MED ORDER — PHENTERMINE HCL 37.5 MG PO TABS: 37.5000 mg | ORAL_TABLET | Freq: Every day | ORAL | Status: DC

## 2013-11-13 NOTE — Telephone Encounter (Signed)
Spoke with patient regarding BP med and cough.  Per DrMcKeown, patient needs to go to [facility] for a chest x-ray and then OV here this PM.

## 2013-11-13 NOTE — Progress Notes (Signed)
   Subjective:    Patient ID: Andrew Cochran, male    DOB: 04/21/1941, 72 y.o.   MRN: [id number]  HPI 73 y.o. male with history of smoking, morbidly obses presents with persistent cough for 3 weeks. Patient states that he had elevated protein in his urine and was started on an ACE, he developed a cough and stopped it, started on losartan, took one pill and he has continued to cough but it has decreased to 1-2 coughs a day. Denies GERd,, PND.   11/13/2013 CHEST 2 VIEW  COMPARISON: None.  FINDINGS:  The heart size and mediastinal contours are within normal limits.  Both lungs are clear. No evidence of pleural effusion. No mass or  lymphadenopathy identified. Thoracic spine degenerative changes  noted, as well as acromioclavicular degenerative changes.  IMPRESSION:  No active cardiopulmonary disease.   Review of Systems  Constitutional: Negative.   HENT: Negative.   Eyes: Negative.   Respiratory: Positive for cough. Negative for apnea, choking, chest tightness, shortness of breath, wheezing and stridor.   Cardiovascular: Negative.   Gastrointestinal: Negative.   Genitourinary: Negative.   Musculoskeletal: Negative.   Neurological: Negative.   Hematological: Negative.        Objective:   Physical Exam  Constitutional: He is oriented to person, place, and time. He appears well-developed and well-nourished.  HENT:  Head: Normocephalic and atraumatic.  Eyes: Conjunctivae and EOM are normal. Pupils are equal, round, and reactive to light.  Neck: Normal range of motion. Neck supple.  Cardiovascular: Normal rate and regular rhythm.   Pulmonary/Chest: Effort normal and breath sounds normal.  Abdominal: Soft. Bowel sounds are normal.  obese  Musculoskeletal: Normal range of motion.  Neurological: He is alert and oriented to person, place, and time. He has normal reflexes.  Skin: Skin is warm and dry.       Assessment & Plan:  Cough: ? From ACE/ARB- willing to try Benicar  samples, will try and return 1 month Possible nocturnal GERD- Please try nexium samples PND- Can start loratadine/certizine/fexafinadine/chlortrimeton Work hard to stop throat clearing.  Plan a time when you will able to practice complete voice rest  Use sugar free candy to ease cough and throat irritation.  We will use Hycodan and Tessalon Perles to suppress cough so your throat irritation can recover.   Obesity with co morbidities- long discussion about weight loss, diet, and exercise  Will start the patient on phentermine- hand out given  Follow up in 1 month with food diary

## 2013-11-13 NOTE — Patient Instructions (Addendum)
Use Benicar 20mg  cut in half and take once daily  Please take the nexium samples once daily in the morning.   Work hard to stop throat clearing.  Plan a time when you will able to practice complete voice rest  Use sugar free candy to ease cough and throat irritation.   Cough, Adult  A cough is a reflex that helps clear your throat and airways. It can help heal the body or may be a reaction to an irritated airway. A cough may only last 2 or 3 weeks (acute) or may last more than 8 weeks (chronic).  CAUSES Acute cough:  Viral or bacterial infections. Chronic cough:  Infections.  Allergies.  Asthma.  Post-nasal drip.  Smoking.  Heartburn or acid reflux.  Some medicines.  Chronic lung problems (COPD).  Cancer. SYMPTOMS   Cough.  Fever.  Chest pain.  Increased breathing rate.  High-pitched whistling sound when breathing (wheezing).  Colored mucus that you cough up (sputum). TREATMENT   A bacterial cough may be treated with antibiotic medicine.  A viral cough must run its course and will not respond to antibiotics.  Your caregiver may recommend other treatments if you have a chronic cough. HOME CARE INSTRUCTIONS   Only take over-the-counter or prescription medicines for pain, discomfort, or fever as directed by your caregiver. Use cough suppressants only as directed by your caregiver.  Use a cold steam vaporizer or humidifier in your bedroom or home to help loosen secretions.  Sleep in a semi-upright position if your cough is worse at night.  Rest as needed.  Stop smoking if you smoke. SEEK IMMEDIATE MEDICAL CARE IF:   You have pus in your sputum.  Your cough starts to worsen.  You cannot control your cough with suppressants and are losing sleep.  You begin coughing up blood.  You have difficulty breathing.  You develop pain which is getting worse or is uncontrolled with medicine.  You have a fever. MAKE SURE YOU:   Understand these  instructions.  Will watch your condition.  Will get help right away if you are not doing well or get worse. Document Released: 11/11/2010 Document Revised: 08/07/2011 Document Reviewed: 11/11/2010 [facility] Patient Information 2015 [place], [place]. This information is not intended to replace advice given to you by your health care provider. Make sure you discuss any questions you have with your health care provider.  Phentermine  While taking the medication we will ask that you come into the office once a month to monitor your weight, blood pressure, and heart rate. In addition we can help answer your questions about diet, exercise, and help you every step of the way with your weight loss journey. Sometime it is helpful if you bring in a food diary or use an app on your phone such as myfitnesspal to record your calorie intake, especially in the beginning.   You can start out on 1/3 to 1/2 a pill in the morning and if you are tolerating it well you can increase to one pill daily.   What is this medicine? PHENTERMINE (FEN ter meen) decreases your appetite. This medicine is intended to be used in addition to a healthy reduced calorie diet and exercise. The best results are achieved this way. This medicine is only indicated for short-term use. Eventually your weight loss may level out and the medication will no longer be needed.   How should I use this medicine? Take this medicine by mouth. Follow the directions on the prescription  label. The tablets should stay in the bottle until immediately before you take your dose. Take your doses at regular intervals. Do not take your medicine more often than directed.  Overdosage: If you think you have taken too much of this medicine contact a poison control center or emergency room at once. NOTE: This medicine is only for you. Do not share this medicine with others.  What if I miss a dose? If you miss a dose, take it as soon as you can. If it is almost time  for your next dose, take only that dose. Do not take double or extra doses. Do not increase or in any way change your dose without consulting your doctor.  What should I watch for while using this medicine? Notify your physician immediately if you become short of breath while doing your normal activities. Do not take this medicine within 6 hours of bedtime. It can keep you from getting to sleep. Avoid drinks that contain caffeine and try to stick to a regular bedtime every night. Do not stand or sit up quickly, especially if you are an older patient. This reduces the risk of dizzy or fainting spells. Avoid alcoholic drinks.  What side effects may I notice from receiving this medicine? Side effects that you should report to your doctor or health care professional as soon as possible: -chest pain, palpitations -depression or severe changes in mood -increased blood pressure -irritability -nervousness or restlessness -severe dizziness -shortness of breath -problems urinating -unusual swelling of the legs -vomiting  Side effects that usually do not require medical attention (report to your doctor or health care professional if they continue or are bothersome): -blurred vision or other eye problems -changes in sexual ability or desire -constipation or diarrhea -difficulty sleeping -dry mouth or unpleasant taste -headache -nausea This list may not describe all possible side effects. Call your doctor for medical advice about side effects. You may report side effects to FDA at 1-800-FDA-1088.

## 2013-12-04 ENCOUNTER — Telehealth: Payer: Self-pay

## 2013-12-04 MED ORDER — OLMESARTAN MEDOXOMIL 40 MG PO TABS: 40.0000 mg | ORAL_TABLET | Freq: Every day | ORAL | Status: DC

## 2013-12-04 NOTE — Telephone Encounter (Signed)
Return call to patient to advise that he is on 20 mg and breaking the 40's in half, verbalized understanding

## 2013-12-24 NOTE — Progress Notes (Signed)
MEDICARE ANNUAL WELLNESS VISIT AND FOLLOW UP Assessment:   1. Essential hypertension - CBC with Differential - BASIC METABOLIC PANEL WITH GFR - Hepatic function panel  2. Hypothyroidism - TSH  3. BPH with elevated PSA monitor  4. Encounter for long-term (current) use of other medications - Magnesium  5. Gout without tophus, unspecified cause, unspecified chronicity, unspecified site Continue allopurinol, weight loss discussed  6. Hyperlipidemia - Lipid panel  7. PreDiabetes Discussed general issues about diabetes pathophysiology and management., Educational material distributed., Suggested low cholesterol diet., Encouraged aerobic exercise., Discussed foot care., Reminded to get yearly retinal exam. - [facility] DIABETES FOOT EXAM  8. Vitamin D Deficiency - Vit D  25 hydroxy (rtn osteoporosis monitoring)  9. Hairy tongue - nystatin mouth wash sent in, good hygiene discussed, scrap/brush tongue  10.Colonoscopy - patient declines a colonoscopy even though the risks and benefits were discussed at length. Colon cancer is 3rd most diagnosed cancer and 2nd leading cause of death in both men and women 67 years of age and older. Patient understands the risk of cancer and death with declining the test however they are willing to do cologuard screening instead. They understand that this is not as sensitive or specific as a colonoscopy and they are still recommended to get a colonoscopy. The cologuard will be sent out to their house.     Plan:   During the course of the visit the patient was educated and counseled about appropriate screening and preventive services including:    Pneumococcal vaccine   Influenza vaccine  Td vaccine  Screening electrocardiogram  Colorectal cancer screening  Diabetes screening  Glaucoma screening  Nutrition counseling   Screening recommendations, referrals: Vaccinations: Tdap vaccine not indicated Influenza vaccine declined Pneumococcal  vaccine will do research about prevnar 13 Shingles vaccine declined Hep B vaccine not indicated  Nutrition assessed and recommended  Colonoscopy declines colonoscopy but willing to do cologuard Recommended yearly ophthalmology/optometry visit for glaucoma screening and checkup Recommended yearly dental visit for hygiene and checkup Advanced directives - requested  Conditions/risks identified: BMI: Discussed weight loss, diet, and increase physical activity.  Increase physical activity: AHA recommends 150 minutes of physical activity a week.  Medications reviewed Diabetes is at goal, ACE/ARB therapy: Yes. Urinary Incontinence is not an issue: discussed non pharmacology and pharmacology options.  Fall risk: low- discussed PT, home fall assessment, medications.    Subjective:  Andrew Cochran is a 73 y.o. male who presents for Medicare Annual Wellness Visit and 3 month follow up for HTN, hyperlipidemia, prediabetes, and vitamin D Def.  Date of last medicare wellness visit was is unknown.  His blood pressure has been controlled at home, today their BP is BP: 120/70 mmHg He does not workout. He denies chest pain, shortness of breath, dizziness.  He is not on cholesterol medication and denies myalgias. His cholesterol is not at goal. The cholesterol last visit was:   Lab Results  Component Value Date   CHOL 199 09/25/2013   HDL 44 09/25/2013   LDLCALC 128* 09/25/2013   TRIG 133 09/25/2013   CHOLHDL 4.5 09/25/2013   Last A1C in the office was:  Lab Results  Component Value Date   HGBA1C 5.3 09/25/2013   Patient is on Vitamin D supplement.   Lab Results  Component Value Date   VD25OH 8* 09/25/2013     He is on thyroid medication. His medication was not changed last visit. Patient denies nervousness, palpitations and weight changes.  Lab Results  Component  Value Date   TSH 0.634 09/25/2013  .  Patient is on allopurinol for gout and does not report a recent flare.  He has noticed on  the back of his tongue a dark "hairy" spot for the past month, has not tried any gargles or scrapping it off, no pain.  He is involved in a psoriasis study in July for a year, he takes 4 tablets a day.   Names of Other Physician/Practitioners you currently use: 1.  [facility] here for primary care 2. Dr. Katy Fitch, eye doctor, last visit Jun 07 2013 for cataract removal 3. [facility] Pearson [facility], dentist, last visit q 6 months, last cleaning 1 month ago Patient Care Team: Unk Pinto, MD as PCP - General (Internal Medicine) [facility] Grant Fontana., MD as Attending Physician (Urology) Gatha Mayer, MD as Consulting Physician (Gastroenterology)  Medication Review: Current Outpatient Prescriptions on File Prior to Visit  Medication Sig Dispense Refill  . allopurinol (ZYLOPRIM) 300 MG tablet Take 1 tablet (300 mg total) by mouth daily. To prevent gout  90 tablet  99  . Ascorbic Acid (VITAMIN C PO) Take 2,000 mg by mouth daily.       Marland Kitchen atenolol (TENORMIN) 100 MG tablet Take 1 tablet (100 mg total) by mouth daily.  90 tablet  1  . BABY ASPIRIN PO Take 81 mg by mouth daily.      . Cholecalciferol (VITAMIN D PO) Take 10,000 Int'l Units by mouth daily.      Marland Kitchen levothyroxine (SYNTHROID, LEVOTHROID) 200 MCG tablet TAKE ONE AND ONE-HALF TABLETS BY MOUTH EVERY DAY  135 tablet  1  . losartan (COZAAR) 100 MG tablet Take 1/2 to 1 tab daily for BP  30 tablet  2  . MAGNESIUM PO Take 250 mg by mouth 3 (three) times daily.       Marland Kitchen olmesartan (BENICAR) 40 MG tablet Take 1 tablet (40 mg total) by mouth daily.  30 tablet  6  . Omega-3 Fatty Acids (FISH OIL PO) Take 1,000 mg by mouth daily.       . phentermine (ADIPEX-P) 37.5 MG tablet Take 1 tablet (37.5 mg total) by mouth daily before breakfast.  30 tablet  0  . PREDNISONE PO Take 10 mg by mouth.      . Red Yeast Rice Extract (RED YEAST RICE PO) Take 1,000 mg by mouth daily.        No current  facility-administered medications on file prior to visit.    Current Problems (verified) Patient Active Problem List   Diagnosis Date Noted  . PreDiabetes 09/25/2013  . Vitamin D Deficiency 09/25/2013  . Encounter for long-term (current) use of other medications 09/25/2013  . Cataracts, bilateral   . Gout   . Hypertension   . Hypothyroidism   . BPH with elevated PSA   . Hyperlipidemia     Screening Tests Health Maintenance  Topic Date Due  . Zostavax  08/23/2002  . Colonoscopy  02/16/2013  . Influenza Vaccine  12/27/2013  . Tetanus/tdap  09/08/2019  . Pneumococcal Polysaccharide Vaccine Age 47 And Over  Completed    Immunization History  Administered Date(s) Administered  . Pneumococcal Polysaccharide-23 08/19/2008  . Td 09/07/2009    Preventative care: Last colonoscopy: 2004 Dr. Carlean Purl declines but willing to do cologuard  Prior vaccinations: TD or Tdap: 2011  Influenza: declines Pneumococcal: 2010 Shingles/Zostavax: declines  History reviewed: allergies, current medications, past family history, past medical history, past social  history, past surgical history and problem list   Risk Factors: Tobacco History  Substance Use Topics  . Smoking status: Former Smoker    Quit date: 05/30/1959  . Smokeless tobacco: Never Used  . Alcohol Use: 8.4 oz/week    14 Glasses of wine per week     Comment: occasional   He does not smoke.  Patient is a former smoker. Are there smokers in your home (other than you)?  No  Alcohol Current alcohol use: glass of wine with dinner  Caffeine Current caffeine use: coffee 2 /day  Exercise Current exercise: none  Nutrition/Diet Current diet: in general, a "healthy" diet    Cardiac risk factors: advanced age (older than 29 for men, 43 for women), dyslipidemia, hypertension, male gender, obesity (BMI >= 30 kg/m2) and sedentary lifestyle.  Depression Screen (Note: if answer to either of the following is "Yes", a more  complete depression screening is indicated)   Q1: Over the past two weeks, have you felt down, depressed or hopeless? No  Q2: Over the past two weeks, have you felt little interest or pleasure in doing things? No  Have you lost interest or pleasure in daily life? No  Do you often feel hopeless? No  Do you cry easily over simple problems? No  Activities of Daily Living In your present state of health, do you have any difficulty performing the following activities?:  Driving? No Managing money?  No Feeding yourself? No Getting from bed to chair? No Climbing a flight of stairs? No Preparing food and eating?: No Bathing or showering? No Getting dressed: No Getting to the toilet? No Using the toilet:No Moving around from place to place: No In the past year have you fallen or had a near fall?:No   Are you sexually active?  Yes  Do you have more than one partner?  No  Vision Difficulties: No  Hearing Difficulties: No Do you often ask people to speak up or repeat themselves? No Do you experience ringing or noises in your ears? No Do you have difficulty understanding soft or whispered voices? No  Cognition  Do you feel that you have a problem with memory?Yes  Do you often misplace items? Yes  Do you feel safe at home?  Yes  Advanced directives Does patient have a [place]? No Does patient have a Living Will? No   Objective:   Blood pressure 120/70, pulse 64, temperature 97.9 F (36.6 C), resp. rate 16, height 5\' 7"  (1.702 m), weight 234 lb (106.142 kg). Body mass index is 36.64 kg/(m^2).  General appearance: alert, no distress, WD/WN, male Cognitive Testing  Alert? Yes  Normal Appearance?Yes  Oriented to person? Yes  Place? Yes   Time? Yes  Recall of three objects?  Yes  Can perform simple calculations? Yes  Displays appropriate judgment?Yes  Can read the correct time from a watch face?Yes  HEENT: normocephalic, sclerae anicteric, TMs pearly,  nares patent, no discharge or erythema, pharynx normal Oral cavity: MMM, no lesions Neck: supple, no lymphadenopathy, no thyromegaly, no masses Heart: RRR, normal S1, S2, no murmurs Lungs: CTA bilaterally, no wheezes, rhonchi, or rales Abdomen: +bs, soft, non tender, non distended, no masses, no hepatomegaly, no splenomegaly Musculoskeletal: nontender, no swelling, no obvious deformity Extremities: no edema, no cyanosis, no clubbing Pulses: 2+ symmetric, upper and lower extremities, normal cap refill Neurological: alert, oriented x 3, CN2-12 intact, strength normal upper extremities and lower extremities, sensation normal throughout, DTRs 2+ throughout, no  cerebellar signs, gait normal Psychiatric: normal affect, behavior normal, pleasant   Medicare Attestation I have personally reviewed: The patient's medical and social history Their use of alcohol, tobacco or illicit drugs Their current medications and supplements The patient's functional ability including ADLs,fall risks, home safety risks, cognitive, and hearing and visual impairment Diet and physical activities Evidence for depression or mood disorders  The patient's weight, height, BMI, and visual acuity have been recorded in the chart.  I have made referrals, counseling, and provided education to the patient based on review of the above and I have provided the patient with a written personalized care plan for preventive services.     Vicie Mutters, PA-C   12/26/2013

## 2013-12-26 ENCOUNTER — Ambulatory Visit (INDEPENDENT_AMBULATORY_CARE_PROVIDER_SITE_OTHER): Payer: Medicare Other | Admitting: Physician Assistant

## 2013-12-26 ENCOUNTER — Encounter: Payer: Self-pay | Admitting: Physician Assistant

## 2013-12-26 VITALS — BP 120/70 | HR 64 | Temp 97.9°F | Resp 16 | Ht 67.0 in | Wt 234.0 lb

## 2013-12-26 DIAGNOSIS — Z789 Other specified health status: Secondary | ICD-10-CM

## 2013-12-26 DIAGNOSIS — E039 Hypothyroidism, unspecified: Secondary | ICD-10-CM

## 2013-12-26 DIAGNOSIS — R7309 Other abnormal glucose: Secondary | ICD-10-CM

## 2013-12-26 DIAGNOSIS — N400 Enlarged prostate without lower urinary tract symptoms: Secondary | ICD-10-CM

## 2013-12-26 DIAGNOSIS — Z1331 Encounter for screening for depression: Secondary | ICD-10-CM

## 2013-12-26 DIAGNOSIS — E559 Vitamin D deficiency, unspecified: Secondary | ICD-10-CM

## 2013-12-26 DIAGNOSIS — Z0000 Encounter for general adult medical examination without abnormal findings: Secondary | ICD-10-CM

## 2013-12-26 DIAGNOSIS — E785 Hyperlipidemia, unspecified: Secondary | ICD-10-CM

## 2013-12-26 DIAGNOSIS — R972 Elevated prostate specific antigen [PSA]: Secondary | ICD-10-CM

## 2013-12-26 DIAGNOSIS — M109 Gout, unspecified: Secondary | ICD-10-CM

## 2013-12-26 DIAGNOSIS — I10 Essential (primary) hypertension: Secondary | ICD-10-CM

## 2013-12-26 DIAGNOSIS — Z79899 Other long term (current) drug therapy: Secondary | ICD-10-CM

## 2013-12-26 LAB — CBC WITH DIFFERENTIAL/PLATELET
BASOS ABS: 0 10*3/uL (ref 0.0–0.1)
Basophils Relative: 0 % (ref 0–1)
EOS PCT: 3 % (ref 0–5)
Eosinophils Absolute: 0.2 10*3/uL (ref 0.0–0.7)
HEMATOCRIT: 46.9 % (ref 39.0–52.0)
HEMOGLOBIN: 16.3 g/dL (ref 13.0–17.0)
LYMPHS PCT: 21 % (ref 12–46)
Lymphs Abs: 1.2 10*3/uL (ref 0.7–4.0)
MCH: 33 pg (ref 26.0–34.0)
MCHC: 34.8 g/dL (ref 30.0–36.0)
MCV: 94.9 fL (ref 78.0–100.0)
MONO ABS: 0.6 10*3/uL (ref 0.1–1.0)
MONOS PCT: 11 % (ref 3–12)
NEUTROS ABS: 3.6 10*3/uL (ref 1.7–7.7)
Neutrophils Relative %: 65 % (ref 43–77)
Platelets: 192 10*3/uL (ref 150–400)
RBC: 4.94 MIL/uL (ref 4.22–5.81)
RDW: 14.2 % (ref 11.5–15.5)
WBC: 5.6 10*3/uL (ref 4.0–10.5)

## 2013-12-26 LAB — HEPATIC FUNCTION PANEL
ALBUMIN: 4.1 g/dL (ref 3.5–5.2)
ALT: 72 U/L — ABNORMAL HIGH (ref 0–53)
AST: 56 U/L — ABNORMAL HIGH (ref 0–37)
Alkaline Phosphatase: 93 U/L (ref 39–117)
BILIRUBIN TOTAL: 0.9 mg/dL (ref 0.2–1.2)
Bilirubin, Direct: 0.2 mg/dL (ref 0.0–0.3)
Indirect Bilirubin: 0.7 mg/dL (ref 0.2–1.2)
Total Protein: 7 g/dL (ref 6.0–8.3)

## 2013-12-26 LAB — LIPID PANEL
CHOL/HDL RATIO: 3.5 ratio
Cholesterol: 170 mg/dL (ref 0–200)
HDL: 49 mg/dL (ref >39)
LDL Cholesterol: 96 mg/dL (ref 0–99)
Triglycerides: 127 mg/dL (ref <150)
VLDL: 25 mg/dL (ref 0–40)

## 2013-12-26 LAB — BASIC METABOLIC PANEL WITH GFR
BUN: 11 mg/dL (ref 6–23)
CALCIUM: 9.7 mg/dL (ref 8.4–10.5)
CO2: 30 mEq/L (ref 19–32)
Chloride: 102 mEq/L (ref 96–112)
Creat: 0.73 mg/dL (ref 0.50–1.35)
GFR, Est African American: >89 mL/min
Glucose, Bld: 125 mg/dL — ABNORMAL HIGH (ref 70–99)
Potassium: 4.2 mEq/L (ref 3.5–5.3)
Sodium: 138 mEq/L (ref 135–145)

## 2013-12-26 LAB — MAGNESIUM: Magnesium: 1.8 mg/dL (ref 1.5–2.5)

## 2013-12-26 MED ORDER — NYSTATIN 100000 UNIT/ML MT SUSP: OROMUCOSAL | Status: DC

## 2013-12-26 NOTE — Patient Instructions (Addendum)
Cologuard is an easy to use noninvasive colon cancer screening test based on the latest advances in stool DNA science.   Colon cancer is 3rd most diagnosed cancer and 2nd leading cause of death in both men and women 71 years of age and older despite being one of the most preventable and treatable cancers if found early. You have agreed to do a Cologuard screening and have declined a colonoscopy in spite of being explained the risks and benefits of the colonoscopy in detail, including cancer and death. Please understand that this is test not as sensitive or specific as a colonoscopy and you are still recommended to get a colonoscopy.   If you are NOT medicare please call your insurance company and given them this CPT code, [phone], in order to see how much your insurance company will cover.   You will receive a short call from [place] support center at [employer], when you receive a call they will say they are from [place],  to confirm your mailing address and give you more information.  When they calll you, it will appear on the caller ID as "[employer]" or in some cases only this number will appear, [phone].   [employer] will ship your collection kit directly to you. You will collect a single stool sample in the privacy of your own home, no special preparation required. You will return the kit via [employer] pre-paid shipping or pick-up, in the same box it arrived in. Then I will contact you to discuss your results after I receive them from the laboratory.   If you have any questions or concerns, Cologuard Customer Support Specialist are available 24 hours a day, 7 days a week at [phone] or go to [email or web address].    Prevnar 13 vaccine  Preventative Care for Adults, Male       REGULAR HEALTH EXAMS:  A routine yearly physical is a good way to check in with your primary care provider about your health and preventive screening. It is  also an opportunity to share updates about your health and any concerns you have, and receive a thorough all-over exam.   Most health insurance companies pay for at least some preventative services.  Check with your health plan for specific coverages.  WHAT PREVENTATIVE SERVICES DO MEN NEED?  Adult men should have their weight and blood pressure checked regularly.   Men age 12 and older should have their cholesterol levels checked regularly.  Beginning at age 35 and continuing to age 54, men should be screened for colorectal cancer.  Certain people should may need continued testing until age 14.  Other cancer screening may include exams for testicular and prostate cancer.  Updating vaccinations is part of preventative care.  Vaccinations help protect against diseases such as the flu.  Lab tests are generally done as part of preventative care to screen for anemia and blood disorders, to screen for problems with the kidneys and liver, to screen for bladder problems, to check blood sugar, and to check your cholesterol level.  Preventative services generally include counseling about diet, exercise, avoiding tobacco, drugs, excessive alcohol consumption, and sexually transmitted infections.    GENERAL RECOMMENDATIONS FOR GOOD HEALTH:  Healthy diet:  Eat a variety of foods, including fruit, vegetables, animal or vegetable protein, such as meat, fish, chicken, and eggs, or beans, lentils, tofu, and grains, such as rice.  Drink plenty of water daily.  Decrease saturated fat in the diet, avoid lots of  red meat, processed foods, sweets, fast foods, and fried foods.  Exercise:  Aerobic exercise helps maintain good heart health. At least 30-40 minutes of moderate-intensity exercise is recommended. For example, a brisk walk that increases your heart rate and breathing. This should be done on most days of the week.   Find a type of exercise or a variety of exercises that you enjoy so that it  becomes a part of your daily life.  Examples are running, walking, swimming, water aerobics, and biking.  For motivation and support, explore group exercise such as aerobic class, spin class, Zumba, Yoga,or  martial arts, etc.    Set exercise goals for yourself, such as a certain weight goal, walk or run in a race such as a 5k walk/run.  Speak to your primary care provider about exercise goals.  Disease prevention:  If you smoke or chew tobacco, find out from your caregiver how to quit. It can literally save your life, no matter how long you have been a tobacco user. If you do not use tobacco, never begin.   Maintain a healthy diet and normal weight. Increased weight leads to problems with blood pressure and diabetes.   The Body Mass Index or BMI is a way of measuring how much of your body is fat. Having a BMI above 27 increases the risk of heart disease, diabetes, hypertension, stroke and other problems related to obesity. Your caregiver can help determine your BMI and based on it develop an exercise and dietary program to help you achieve or maintain this important measurement at a healthful level.  High blood pressure causes heart and blood vessel problems.  Persistent high blood pressure should be treated with medicine if weight loss and exercise do not work.   Fat and cholesterol leaves deposits in your arteries that can block them. This causes heart disease and vessel disease elsewhere in your body.  If your cholesterol is found to be high, or if you have heart disease or certain other medical conditions, then you may need to have your cholesterol monitored frequently and be treated with medication.   Ask if you should have a stress test if your history suggests this. A stress test is a test done on a treadmill that looks for heart disease. This test can find disease prior to there being a problem.  Avoid drinking alcohol in excess (more than two drinks per day).  Avoid use of street drugs.  Do not share needles with anyone. Ask for professional help if you need assistance or instructions on stopping the use of alcohol, cigarettes, and/or drugs.  Brush your teeth twice a day with fluoride toothpaste, and floss once a day. Good oral hygiene prevents tooth decay and gum disease. The problems can be painful, unattractive, and can cause other health problems. Visit your dentist for a routine oral and dental check up and preventive care every 6-12 months.   Look at your skin regularly.  Use a mirror to look at your back. Notify your caregivers of changes in moles, especially if there are changes in shapes, colors, a size larger than a pencil eraser, an irregular border, or development of new moles.  Safety:  Use seatbelts 100% of the time, whether driving or as a passenger.  Use safety devices such as hearing protection if you work in environments with loud noise or significant background noise.  Use safety glasses when doing any work that could send debris in to the eyes.  Use a helmet  if you ride a bike or motorcycle.  Use appropriate safety gear for contact sports.  Talk to your caregiver about gun safety.  Use sunscreen with a SPF (or skin protection factor) of 15 or greater.  Lighter skinned people are at a greater risk of skin cancer. Don't forget to also wear sunglasses in order to protect your eyes from too much damaging sunlight. Damaging sunlight can accelerate cataract formation.   Practice safe sex. Use condoms. Condoms are used for birth control and to help reduce the spread of sexually transmitted infections (or STIs).  Some of the STIs are gonorrhea (the clap), chlamydia, syphilis, trichomonas, herpes, HPV (human papilloma virus) and HIV (human immunodeficiency virus) which causes AIDS. The herpes, HIV and HPV are viral illnesses that have no cure. These can result in disability, cancer and death.   Keep carbon monoxide and smoke detectors in your home functioning at all times.  Change the batteries every 6 months or use a model that plugs into the wall.   Vaccinations:  Stay up to date with your tetanus shots and other required immunizations. You should have a booster for tetanus every 10 years. Be sure to get your flu shot every year, since 5%-20% of the [place] population comes down with the flu. The flu vaccine changes each year, so being vaccinated once is not enough. Get your shot in the fall, before the flu season peaks.   Other vaccines to consider:  Pneumococcal vaccine to protect against certain types of pneumonia.  This is normally recommended for adults age 54 or older.  However, adults younger than 71 years old with certain underlying conditions such as diabetes, heart or lung disease should also receive the vaccine.  Shingles vaccine to protect against Varicella Zoster if you are older than age 44, or younger than 71 years old with certain underlying illness.  Hepatitis A vaccine to protect against a form of infection of the liver by a virus acquired from food.  Hepatitis B vaccine to protect against a form of infection of the liver by a virus acquired from blood or body fluids, particularly if you work in health care.  If you plan to travel internationally, check with your local health department for specific vaccination recommendations.  Cancer Screening:  Most routine colon cancer screening begins at the age of 53. On a yearly basis, doctors may provide special easy to use take-home tests to check for hidden blood in the stool. Sigmoidoscopy or colonoscopy can detect the earliest forms of colon cancer and is life saving. These tests use a small camera at the end of a tube to directly examine the colon. Speak to your caregiver about this at age 14, when routine screening begins (and is repeated every 5 years unless early forms of pre-cancerous polyps or small growths are found).   At the age of 5 men usually start screening for prostate cancer every year.  Screening may begin at a younger age for those with higher risk. Those at higher risk include African-Americans or having a family history of prostate cancer. There are two types of tests for prostate cancer:   Prostate-specific antigen (PSA) testing. Recent studies raise questions about prostate cancer using PSA and you should discuss this with your caregiver.   Digital rectal exam (in which your doctor's lubricated and gloved finger feels for enlargement of the prostate through the anus).   Screening for testicular cancer.  Do a monthly exam of your testicles. Gently roll each testicle between  your thumb and fingers, feeling for any abnormal lumps. The best time to do this is after a hot shower or bath when the tissues are looser. Notify your caregivers of any lumps, tenderness or changes in size or shape immediately.

## 2013-12-27 LAB — VITAMIN D 25 HYDROXY (VIT D DEFICIENCY, FRACTURES): VIT D 25 HYDROXY: 104 ng/mL — AB (ref 30–89)

## 2013-12-27 LAB — TSH: TSH: 1.355 u[IU]/mL (ref 0.350–4.500)

## 2014-01-08 LAB — COLOGUARD: Cologuard: POSITIVE

## 2014-01-27 ENCOUNTER — Other Ambulatory Visit: Payer: Self-pay | Admitting: Internal Medicine

## 2014-02-03 ENCOUNTER — Other Ambulatory Visit: Payer: Self-pay | Admitting: Physician Assistant

## 2014-02-03 MED ORDER — OLMESARTAN MEDOXOMIL 20 MG PO TABS: 20.0000 mg | ORAL_TABLET | Freq: Every day | ORAL | Status: DC

## 2014-02-03 MED ORDER — HYDROCODONE-ACETAMINOPHEN 5-325 MG PO TABS: 1.0000 | ORAL_TABLET | Freq: Four times a day (QID) | ORAL | Status: DC | PRN

## 2014-02-12 ENCOUNTER — Ambulatory Visit (INDEPENDENT_AMBULATORY_CARE_PROVIDER_SITE_OTHER): Payer: Medicare Other | Admitting: Internal Medicine

## 2014-02-12 ENCOUNTER — Encounter: Payer: Self-pay | Admitting: Internal Medicine

## 2014-02-12 ENCOUNTER — Other Ambulatory Visit: Payer: Self-pay

## 2014-02-12 VITALS — BP 122/74 | HR 60 | Temp 98.1°F | Resp 18 | Ht 67.0 in | Wt 228.2 lb

## 2014-02-12 DIAGNOSIS — K921 Melena: Secondary | ICD-10-CM

## 2014-02-12 DIAGNOSIS — K5732 Diverticulitis of large intestine without perforation or abscess without bleeding: Secondary | ICD-10-CM

## 2014-02-12 MED ORDER — CIPROFLOXACIN HCL 500 MG PO TABS: 500.0000 mg | ORAL_TABLET | Freq: Two times a day (BID) | ORAL | Status: DC

## 2014-02-12 MED ORDER — CIPROFLOXACIN HCL 500 MG PO TABS: 500.0000 mg | ORAL_TABLET | Freq: Two times a day (BID) | ORAL | Status: AC

## 2014-02-12 MED ORDER — METRONIDAZOLE 500 MG PO TABS
500.0000 mg | ORAL_TABLET | Freq: Three times a day (TID) | ORAL | Status: DC
Start: 2014-02-12 — End: 2014-02-12

## 2014-02-12 MED ORDER — METRONIDAZOLE 500 MG PO TABS: 500.0000 mg | ORAL_TABLET | Freq: Three times a day (TID) | ORAL | Status: AC

## 2014-02-12 MED ORDER — LOSARTAN POTASSIUM 100 MG PO TABS: 100.0000 mg | ORAL_TABLET | Freq: Every day | ORAL | Status: DC

## 2014-02-12 NOTE — Progress Notes (Signed)
Subjective:    Patient ID: Andrew Cochran, male    DOB: 1940/09/10, 73 y.o.   MRN: [id number]  Abdominal Pain This is a new problem. The current episode started in the past 7 days. The onset quality is gradual. The problem occurs intermittently. The problem has been gradually worsening. The pain is located in the LLQ and generalized abdominal region. The pain is mild. The quality of the pain is aching, colicky, cramping and sharp. Associated symptoms include diarrhea and nausea. Pertinent negatives include no anorexia, arthralgias, belching, constipation, dysuria, fever, flatus, frequency, headaches, hematochezia, hematuria, melena, myalgias, vomiting or weight loss. The pain is aggravated by certain positions. The pain is relieved by nothing. He has tried antibiotics and antacids for the symptoms. The treatment provided no relief.   Medication List   allopurinol 300 MG tablet  Commonly known as:  ZYLOPRIM  Take 1 tablet (300 mg total) by mouth daily. To prevent gout     atenolol 100 MG tablet  Commonly known as:  TENORMIN  Take 1 tablet (100 mg total) by mouth daily.     BABY ASPIRIN PO  Take 81 mg by mouth daily.     ciprofloxacin 500 MG tablet  Commonly known as:  CIPRO  Take 1 tablet (500 mg total) by mouth 2 (two) times daily. With food for infection     FISH OIL PO  Take 1,000 mg by mouth daily.     HYDROcodone-acetaminophen 5-325 MG per tablet  Commonly known as:  NORCO  Take 1 tablet by mouth every 6 (six) hours as needed for moderate pain.     levothyroxine 200 MCG tablet  Commonly known as:  SYNTHROID, LEVOTHROID  TAKE ONE AND ONE-HALF TABLETS BY MOUTH EVERY DAY     losartan 100 MG tablet  Commonly known as:  COZAAR  Take 1 tablet (100 mg total) by mouth daily.     MAGNESIUM PO  Take 250 mg by mouth 3 (three) times daily.     metroNIDAZOLE 500 MG tablet  Commonly known as:  FLAGYL  Take 1 tablet (500 mg total) by mouth 3 (three) times daily. With food for  infection     olmesartan 20 MG tablet  Commonly known as:  BENICAR  Take 1 tablet (20 mg total) by mouth daily.     ONE TOUCH ULTRA TEST test strip  Generic drug:  glucose blood  use once daily as directed to test blood sugar     RED YEAST RICE PO  Take 1,000 mg by mouth daily.     VITAMIN C PO  Take 2,000 mg by mouth daily.     VITAMIN D PO  Take 10,000 Int'l Units by mouth daily.     No Known Allergies  Past Medical History  Diagnosis Date  . Gout   . Cataracts, bilateral   . Hypertension   . Thyroid disease   . BPH with elevated PSA   . Prediabetes   . Hyperlipidemia    Review of Systems  Constitutional: Negative for fever, chills, weight loss, diaphoresis, activity change, appetite change and fatigue.  HENT: Negative.   Eyes: Negative.   Respiratory: Negative.   Cardiovascular: Negative.   Gastrointestinal: Positive for nausea, abdominal pain, diarrhea and rectal pain. Negative for vomiting, constipation, blood in stool, melena, hematochezia, abdominal distention, anal bleeding, anorexia and flatus.  Genitourinary: Negative for dysuria, urgency, frequency, hematuria, flank pain, decreased urine volume, discharge, penile swelling, scrotal swelling, enuresis, penile pain and testicular  pain.  Musculoskeletal: Negative for arthralgias, back pain, gait problem, joint swelling, myalgias, neck pain and neck stiffness.  Skin: Negative for color change, pallor, rash and wound.  Neurological: Negative for headaches.    BP 122/74  Pulse 60  Temp(Src) 98.1 F (36.7 C) (Temporal)  Resp 18  Ht 5\' 7"  (1.702 m)  Wt 228 lb 3.2 oz (103.511 kg)  BMI 35.73 kg/m2 Objective:   Physical Exam  Constitutional: He is oriented to person, place, and time. He appears well-nourished. No distress.  HENT:  Head: Normocephalic.  Right Ear: External ear normal.  Left Ear: External ear normal.  Nose: Nose normal.  Mouth/Throat: Oropharynx is clear and moist. No oropharyngeal exudate.   Eyes: Pupils are equal, round, and reactive to light. Right eye exhibits no discharge. Left eye exhibits no discharge.  Neck: Normal range of motion. Neck supple. No JVD present.  Cardiovascular: Normal rate and normal heart sounds.   No murmur heard. Pulmonary/Chest: Effort normal and breath sounds normal. No respiratory distress. He has no wheezes. He has no rales. He exhibits no tenderness.  Abdominal: Soft. Bowel sounds are normal. He exhibits no mass. There is tenderness. There is no rebound and no guarding.  Tender with sl guardng in LLQ. No rebound.   Musculoskeletal: Normal range of motion.  Neurological: He is alert and oriented to person, place, and time. No cranial nerve deficit.  Skin: Skin is dry. No rash noted. He is not diaphoretic. No erythema. No pallor.   Assessment & Plan:   1. Diverticulitis, suspect  - Rx Cipro 500 mg # 20 x 1 rf - Rx Flagyl 250 mg #30 x 1 rf  - Discussed diet/med/SE's

## 2014-02-12 NOTE — Patient Instructions (Signed)

## 2014-02-13 ENCOUNTER — Telehealth: Payer: Self-pay

## 2014-02-13 ENCOUNTER — Encounter: Payer: Self-pay | Admitting: Internal Medicine

## 2014-02-13 NOTE — Telephone Encounter (Signed)
Received paper note from patient, patient called to advise that his appointment with Dr Carlean Purl is in November and he wanted to know if that was okay, (patient received a positive on his Cologuard home test kit) advised patient that per Dr Melford Aase it was okay to keep November 24 apt with Carlean Purl

## 2014-03-19 ENCOUNTER — Encounter: Payer: Self-pay | Admitting: Physician Assistant

## 2014-04-02 ENCOUNTER — Encounter: Payer: Self-pay | Admitting: Internal Medicine

## 2014-04-02 ENCOUNTER — Ambulatory Visit (INDEPENDENT_AMBULATORY_CARE_PROVIDER_SITE_OTHER): Payer: Medicare Other | Admitting: Internal Medicine

## 2014-04-02 VITALS — BP 130/72 | HR 56 | Temp 97.7°F | Resp 16 | Ht 67.25 in | Wt 218.0 lb

## 2014-04-02 DIAGNOSIS — E785 Hyperlipidemia, unspecified: Secondary | ICD-10-CM

## 2014-04-02 DIAGNOSIS — I10 Essential (primary) hypertension: Secondary | ICD-10-CM

## 2014-04-02 DIAGNOSIS — Z79899 Other long term (current) drug therapy: Secondary | ICD-10-CM

## 2014-04-02 DIAGNOSIS — R7303 Prediabetes: Secondary | ICD-10-CM

## 2014-04-02 DIAGNOSIS — R7309 Other abnormal glucose: Secondary | ICD-10-CM

## 2014-04-02 DIAGNOSIS — E559 Vitamin D deficiency, unspecified: Secondary | ICD-10-CM

## 2014-04-02 DIAGNOSIS — S90851A Superficial foreign body, right foot, initial encounter: Secondary | ICD-10-CM

## 2014-04-02 LAB — CBC WITH DIFFERENTIAL/PLATELET
BASOS PCT: 0 % (ref 0–1)
Basophils Absolute: 0 10*3/uL (ref 0.0–0.1)
EOS PCT: 2 % (ref 0–5)
Eosinophils Absolute: 0.1 10*3/uL (ref 0.0–0.7)
HEMATOCRIT: 42.9 % (ref 39.0–52.0)
HEMOGLOBIN: 14.9 g/dL (ref 13.0–17.0)
LYMPHS ABS: 1.5 10*3/uL (ref 0.7–4.0)
Lymphocytes Relative: 22 % (ref 12–46)
MCH: 32.5 pg (ref 26.0–34.0)
MCHC: 34.7 g/dL (ref 30.0–36.0)
MCV: 93.7 fL (ref 78.0–100.0)
MONO ABS: 0.4 10*3/uL (ref 0.1–1.0)
Monocytes Relative: 6 % (ref 3–12)
NEUTROS PCT: 70 % (ref 43–77)
Neutro Abs: 4.8 10*3/uL (ref 1.7–7.7)
Platelets: 189 10*3/uL (ref 150–400)
RBC: 4.58 MIL/uL (ref 4.22–5.81)
RDW: 13.5 % (ref 11.5–15.5)
WBC: 6.8 10*3/uL (ref 4.0–10.5)

## 2014-04-02 NOTE — Patient Instructions (Signed)

## 2014-04-02 NOTE — Progress Notes (Signed)
Patient ID: Andrew Cochran, male   DOB: February 19, 1941, 73 y.o.   MRN: [id number]   This very nice 73 y.o.male presents for 3 month follow up with Hypertension, Hyperlipidemia, Pre-Diabetes and Vitamin D Deficiency.    Patient also c/o stepping on broken glass ~ 3 weeks ago with suspicion of imbedded glass causing pain with weight bearing or walking.   Patient is treated for HTN & BP has been controlled at home. Today's BP: 130/72 mmHg. Patient has had no complaints of any cardiac type chest pain, palpitations, dyspnea/orthopnea/PND, dizziness, claudication, or dependent edema.   Hyperlipidemia is controlled with diet & meds. Patient denies myalgias or other med SE's. Last Lipids were at goal - Total  Chol  170; HDL 49; LDL  96; Trig 127 on 12/26/2013.   Also, the patient has history of Morbid Obesity and PreDiabetes and has had no symptoms of reactive hypoglycemia, diabetic polys, paresthesias or visual blurring.  Last A1c was 5.3% on 09/25/2013.   Further, the patient also has history of Vitamin D Deficiency and supplements vitamin D without any suspected side-effects. Last vitamin D was  104 on 12/26/2013 and dose was decreased. .    Medication List   atenolol 100 MG tablet  Commonly known as:  TENORMIN  Take 1 tablet (100 mg total) by mouth daily.     BABY ASPIRIN PO  Take 81 mg by mouth daily.     FISH OIL PO  Take 1,000 mg by mouth daily.     HYDROcodone-acetaminophen 5-325 MG per tablet  Commonly known as:  NORCO  Take 1 tablet by mouth every 6 (six) hours as needed for moderate pain.     levothyroxine 200 MCG tablet  Commonly known as:  SYNTHROID, LEVOTHROID  TAKE ONE AND ONE-HALF TABLETS BY MOUTH EVERY DAY     losartan 100 MG tablet  Commonly known as:  COZAAR  Take 1 tablet (100 mg total) by mouth daily.     MAGNESIUM PO  Take 250 mg by mouth 3 (three) times daily.     olmesartan 20 MG tablet  Commonly known as:  BENICAR  Take 1 tablet (20 mg total) by mouth daily.     ONE TOUCH ULTRA TEST test strip  Generic drug:  glucose blood  use once daily as directed to test blood sugar     RED YEAST RICE PO  Take 1,000 mg by mouth daily.     VITAMIN C PO  Take 2,000 mg by mouth daily.     VITAMIN D PO  Take 10,000 Int'l Units by mouth daily.     No Known Allergies  PMHx:   Past Medical History  Diagnosis Date  . Gout   . Cataracts, bilateral   . Hypertension   . Thyroid disease   . BPH with elevated PSA   . Prediabetes   . Hyperlipidemia    Immunization History  Administered Date(s) Administered  . Pneumococcal Polysaccharide-23 08/19/2008  . Td 09/07/2009   Past Surgical History  Procedure Laterality Date  . Cataract extraction Right 2009  . Shoulder arthroscopy Right     1979   FHx:    Reviewed / unchanged  SHx:    Reviewed / unchanged  Systems Review:  Constitutional: Denies fever, chills, wt changes, headaches, insomnia, fatigue, night sweats, change in appetite. Eyes: Denies redness, blurred vision, diplopia, discharge, itchy, watery eyes.  ENT: Denies discharge, congestion, post nasal drip, epistaxis, sore throat, earache, hearing loss, dental pain, tinnitus, vertigo,  sinus pain, snoring.  CV: Denies chest pain, palpitations, irregular heartbeat, syncope, dyspnea, diaphoresis, orthopnea, PND, claudication or edema. Respiratory: denies cough, dyspnea, DOE, pleurisy, hoarseness, laryngitis, wheezing.  Gastrointestinal: Denies dysphagia, odynophagia, heartburn, reflux, water brash, abdominal pain or cramps, nausea, vomiting, bloating, diarrhea, constipation, hematemesis, melena, hematochezia  or hemorrhoids. Genitourinary: Denies dysuria, frequency, urgency, nocturia, hesitancy, discharge, hematuria or flank pain. Musculoskeletal: Denies arthralgias, myalgias, stiffness, jt. swelling, pain, limping or strain/sprain.  Skin: Denies pruritus, rash, hives, warts, acne, eczema or change in skin lesion(s). Neuro: No weakness, tremor,  incoordination, spasms, paresthesia or pain. Psychiatric: Denies confusion, memory loss or sensory loss. Endo: Denies change in weight, skin or hair change.  Heme/Lymph: No excessive bleeding, bruising or enlarged lymph nodes.  Exam:  BP 130/72   Pulse 56  Temp 97.7 F   Resp 16  Ht 5' 7.25"   Wt 218 lb   BMI 33.90   Appears well nourished and in no distress. Eyes: PERRLA, EOMs, conjunctiva no swelling or erythema. Sinuses: No frontal/maxillary tenderness ENT/Mouth: EAC's clear, TM's nl w/o erythema, bulging. Nares clear w/o erythema, swelling, exudates. Oropharynx clear without erythema or exudates. Oral hygiene is good. Tongue normal, non obstructing. Hearing intact.  Neck: Supple. Thyroid nl. Car 2+/2+ without bruits, nodes or JVD. Chest: Respirations nl with BS clear & equal w/o rales, rhonchi, wheezing or stridor.  Cor: Heart sounds normal w/ regular rate and rhythm without sig. murmurs, gallops, clicks, or rubs. Peripheral pulses normal and equal  without edema.  Abdomen: Soft & bowel sounds normal. Non-tender w/o guarding, rebound, hernias, masses, or organomegaly.  Lymphatics: Unremarkable.  Musculoskeletal: Full ROM all peripheral extremities, joint stability, 5/5 strength, and normal gait.  Skin: Warm, dry without exposed rashes, lesions or ecchymosis apparent.  Neuro: Cranial nerves intact, reflexes equal bilaterally. Sensory-motor testing grossly intact. Tendon reflexes grossly intact.  Pysch: Alert & oriented x 3.  Insight and judgement nl & appropriate. No ideations.    With informed consent and after aseptic skin prep and local anesthesia with Marcaine 0.5 % to an area of hyperkeratosis of the right foot/sole at the  3rd MT head - after sharp dissection and removal of a thick dermal plug a several mm glass fragment was removed with splinter forceps. Antibiotic Ung and sterile bandage was applied.  Assessment and Plan:  1. Hypertension - Continue monitor blood pressure  at home. Continue diet/meds same.  2. Hyperlipidemia - Continue diet/meds, exercise,& lifestyle modifications. Continue monitor periodic cholesterol/liver & renal functions   3. Pre-Diabetes - Continue diet, exercise, lifestyle modifications. Monitor appropriate labs.  4. Vitamin D Deficiency - Continue supplementation.  5. Glass Foreign Body, Right Foot -   Recommended regular exercise, BP monitoring, weight control, and discussed med and SE's. Recommended labs to assess and monitor clinical status. Further disposition pending results of labs.

## 2014-04-03 LAB — HEPATIC FUNCTION PANEL
ALT: 53 U/L (ref 0–53)
AST: 40 U/L — ABNORMAL HIGH (ref 0–37)
Albumin: 4.3 g/dL (ref 3.5–5.2)
Alkaline Phosphatase: 95 U/L (ref 39–117)
BILIRUBIN DIRECT: 0.2 mg/dL (ref 0.0–0.3)
Indirect Bilirubin: 0.7 mg/dL (ref 0.2–1.2)
TOTAL PROTEIN: 6.8 g/dL (ref 6.0–8.3)
Total Bilirubin: 0.9 mg/dL (ref 0.2–1.2)

## 2014-04-03 LAB — MAGNESIUM: Magnesium: 1.7 mg/dL (ref 1.5–2.5)

## 2014-04-03 LAB — BASIC METABOLIC PANEL WITH GFR
BUN: 11 mg/dL (ref 6–23)
CALCIUM: 9.5 mg/dL (ref 8.4–10.5)
CO2: 28 mEq/L (ref 19–32)
CREATININE: 0.73 mg/dL (ref 0.50–1.35)
Chloride: 100 mEq/L (ref 96–112)
GFR, Est Non African American: >89 mL/min
Glucose, Bld: 102 mg/dL — ABNORMAL HIGH (ref 70–99)
Potassium: 4.2 mEq/L (ref 3.5–5.3)
Sodium: 139 mEq/L (ref 135–145)

## 2014-04-03 LAB — INSULIN, FASTING: Insulin fasting, serum: 7.9 u[IU]/mL (ref 2.0–19.6)

## 2014-04-03 LAB — LIPID PANEL
CHOL/HDL RATIO: 5.7 ratio
CHOLESTEROL: 215 mg/dL — AB (ref 0–200)
HDL: 38 mg/dL — ABNORMAL LOW (ref >39)
LDL CALC: 154 mg/dL — AB (ref 0–99)
Triglycerides: 116 mg/dL (ref <150)
VLDL: 23 mg/dL (ref 0–40)

## 2014-04-03 LAB — HEMOGLOBIN A1C
HEMOGLOBIN A1C: 5.2 % (ref <5.7)
Mean Plasma Glucose: 103 mg/dL (ref <117)

## 2014-04-03 LAB — TSH: TSH: 2.786 u[IU]/mL (ref 0.350–4.500)

## 2014-04-03 LAB — VITAMIN D 25 HYDROXY (VIT D DEFICIENCY, FRACTURES): Vit D, 25-Hydroxy: 61 ng/mL (ref 30–89)

## 2014-04-21 ENCOUNTER — Ambulatory Visit (INDEPENDENT_AMBULATORY_CARE_PROVIDER_SITE_OTHER): Payer: Medicare Other | Admitting: Internal Medicine

## 2014-04-21 ENCOUNTER — Encounter: Payer: Self-pay | Admitting: Internal Medicine

## 2014-04-21 VITALS — BP 140/70 | HR 70 | Ht 69.0 in | Wt 218.4 lb

## 2014-04-21 DIAGNOSIS — K429 Umbilical hernia without obstruction or gangrene: Secondary | ICD-10-CM | POA: Insufficient documentation

## 2014-04-21 DIAGNOSIS — R195 Other fecal abnormalities: Secondary | ICD-10-CM

## 2014-04-21 DIAGNOSIS — Z86010 Personal history of colonic polyps: Secondary | ICD-10-CM

## 2014-04-21 DIAGNOSIS — K644 Residual hemorrhoidal skin tags: Secondary | ICD-10-CM

## 2014-04-21 DIAGNOSIS — K648 Other hemorrhoids: Secondary | ICD-10-CM

## 2014-04-21 HISTORY — DX: Umbilical hernia without obstruction or gangrene: K42.9

## 2014-04-21 MED ORDER — HYDROCORTISONE 1 % RE CREA: 1.0000 "application " | TOPICAL_CREAM | Freq: Two times a day (BID) | RECTAL | Status: DC

## 2014-04-21 NOTE — Progress Notes (Signed)
   Subjective:    Patient ID: Andrew Cochran, male    DOB: [birth year], 73 y.o.   MRN: [id number]  HPI   The patient is here because of a+ Cologuard He does have GI symptoms of: Chronic intermittent rectal bleeding but no anal or rectal pain, constipation or diarrhea + incomplete defecation Last colonoscopy 2004 - 7 mm cecal adenoma GI ROS o/w negative Medications, allergies, past medical history, past surgical history, family history and social history are reviewed and updated in the EMR.  Review of Systems All other ROS negative except some back pain    Objective:   Physical Exam General:  Well-developed, well-nourished and in no acute distress Eyes:  anicteric. ENT:   Mouth and posterior pharynx free of lesions.  Neck:   supple w/o thyromegaly or mass.  Lungs: Clear to auscultation bilaterally. Heart:  S1S2, no rubs, murmurs, gallops. Abdomen:  soft, non-tender, no hepatosplenomegaly,  or mass and BS+.  + small umbilical hernia, reducible and non-tender Rectal: + anal tags and tender with spasm and hemorrhoids in canal, somewhat tender but no fissyre Lymph:  no cervical or supraclavicular adenopathy. Extremities:   no edema Skin   no rash. Neuro:  A&O x 3.  Psych:  appropriate mood and  Affect.   Data Reviewed: As per history of present illness, labs in the EMR, primary care notes are also reviewed       Assessment & Plan:  Heme + stool - + Cologuard  Hx of adenomatous polyp of colon  Internal and external bleeding hemorrhoids - Plan: hydrocortisone (PROCTOCORT) 1 % CREA  Umbilical hernia without obstruction and without gangrene  1) Schedule colonoscopy. The risks and benefits as well as alternatives of endoscopic procedure(s) have been discussed and reviewed. All questions answered. The patient agrees to proceed. 2) HC cream for hemorrhoids , may be a banding candidate 3) He is aware of and is observing umbilical hernia  I appreciate the opportunity to care for  this patient.  CC: Alesia Richards, MD

## 2014-04-21 NOTE — Patient Instructions (Addendum)
You have been scheduled for a colonoscopy. Please follow written instructions given to you at your visit today.  Please pick up your prep supplies at the pharmacy. If you use inhalers (even only as needed), please bring them with you on the day of your procedure. Your physician has requested that you go to www.startemmi.com and enter the access code given to you at your visit today. This web site gives a general overview about your procedure. However, you should still follow specific instructions given to you by our office regarding your preparation for the procedure.   We have sent the following medications to your pharmacy for you to pick up at your convenience: Rectal cream, please ;use twice a day for a week and then as needed.   I appreciate the opportunity to care for you.

## 2014-04-29 ENCOUNTER — Ambulatory Visit (INDEPENDENT_AMBULATORY_CARE_PROVIDER_SITE_OTHER): Payer: Medicare Other | Admitting: Physician Assistant

## 2014-04-29 ENCOUNTER — Encounter: Payer: Self-pay | Admitting: Physician Assistant

## 2014-04-29 VITALS — BP 136/82 | HR 66 | Temp 98.2°F | Resp 18 | Ht 67.25 in | Wt 216.0 lb

## 2014-04-29 DIAGNOSIS — J209 Acute bronchitis, unspecified: Secondary | ICD-10-CM

## 2014-04-29 MED ORDER — PROMETHAZINE-CODEINE 6.25-10 MG/5ML PO SYRP: 5.0000 mL | ORAL_SOLUTION | Freq: Four times a day (QID) | ORAL | Status: AC | PRN

## 2014-04-29 MED ORDER — AZITHROMYCIN 250 MG PO TABS: ORAL_TABLET | ORAL | Status: AC

## 2014-04-29 MED ORDER — ALBUTEROL SULFATE HFA 108 (90 BASE) MCG/ACT IN AERS: 2.0000 | INHALATION_SPRAY | Freq: Four times a day (QID) | RESPIRATORY_TRACT | Status: DC | PRN

## 2014-04-29 MED ORDER — PREDNISONE 20 MG PO TABS: ORAL_TABLET | ORAL | Status: AC

## 2014-04-29 NOTE — Progress Notes (Signed)
Subjective:    Patient ID: LC JOYNT, male    DOB: 09-07-1940, 73 y.o.   MRN: [id number]  Cough This is a new problem. Episode onset: 3 days ago. The problem has been gradually worsening. The problem occurs constantly. Cough characteristics: Productive with dark yellow sputum  Associated symptoms include shortness of breath and wheezing. Pertinent negatives include no chills, ear pain, fever, postnasal drip, rash, rhinorrhea or sore throat. Risk factors for lung disease include smoking/tobacco exposure (Former smoker- Quit in [place] to smoke 1ppd.). Treatments tried: Mucinex OTC  The treatment provided mild relief.  GFR= >89 on 04/02/14 Review of Systems  Constitutional: Negative.  Negative for fever, chills, diaphoresis and fatigue.  HENT: Negative.  Negative for congestion, ear discharge, ear pain, postnasal drip, rhinorrhea, sinus pressure, sore throat and trouble swallowing.   Eyes: Negative.   Respiratory: Positive for cough, chest tightness, shortness of breath and wheezing.   Cardiovascular: Negative.   Gastrointestinal: Negative.   Skin: Negative.  Negative for rash.  Allergic/Immunologic: Negative.   Neurological: Negative.    Past Medical History  Diagnosis Date  . Gout   . Cataracts, bilateral   . Hypertension   . Thyroid disease   . BPH with elevated PSA   . Prediabetes   . Hyperlipidemia   . Diverticulosis   . Tubular adenoma 02/17/2003   Current Outpatient Prescriptions on File Prior to Visit  Medication Sig Dispense Refill  . allopurinol (ZYLOPRIM) 300 MG tablet Take 1 tablet (300 mg total) by mouth daily. To prevent gout 90 tablet 99  . Ascorbic Acid (VITAMIN C PO) Take 2,000 mg by mouth daily.     Marland Kitchen atenolol (TENORMIN) 100 MG tablet Take 1 tablet (100 mg total) by mouth daily. 90 tablet 1  . BABY ASPIRIN PO Take 81 mg by mouth daily.    . Cholecalciferol (VITAMIN D PO) Take 10,000 Int'l Units by mouth daily.    Marland Kitchen HYDROcodone-acetaminophen (NORCO) 5-325 MG  per tablet Take 1 tablet by mouth every 6 (six) hours as needed for moderate pain. 60 tablet 0  . hydrocortisone (PROCTOCORT) 1 % CREA Apply 1 application topically 2 (two) times daily. Into anal canal 28.35 g 1  . levothyroxine (SYNTHROID, LEVOTHROID) 200 MCG tablet TAKE ONE AND ONE-HALF TABLETS BY MOUTH EVERY DAY 135 tablet 1  . losartan (COZAAR) 100 MG tablet Take 1 tablet (100 mg total) by mouth daily. 90 tablet prn  . MAGNESIUM PO Take 250 mg by mouth 3 (three) times daily.     Marland Kitchen olmesartan (BENICAR) 20 MG tablet Take 1 tablet (20 mg total) by mouth daily. 30 tablet 6  . Omega-3 Fatty Acids (FISH OIL PO) Take 1,000 mg by mouth daily.     . ONE TOUCH ULTRA TEST test strip use once daily as directed to test blood sugar 100 each 99  . predniSONE (DELTASONE) 20 MG tablet     . Red Yeast Rice Extract (RED YEAST RICE PO) Take 1,000 mg by mouth daily.      No current facility-administered medications on file prior to visit.   Allergies  Allergen Reactions  . Lotensin [Benazepril Hcl]     unknown  . Penicillins Rash     BP 136/82 mmHg  Pulse 66  Temp(Src) 98.2 F (36.8 C) (Temporal)  Resp 18  Ht 5' 7.25" (1.708 m)  Wt 216 lb (97.977 kg)  BMI 33.59 kg/m2  SpO2 98% Wt Readings from Last 3 Encounters:  04/29/14 216 lb (97.977  kg)  04/21/14 218 lb 6.4 oz (99.066 kg)  04/02/14 218 lb (98.884 kg)   Objective:   Physical Exam  Constitutional: He is oriented to person, place, and time. He appears well-developed and well-nourished. He has a sickly appearance. No distress.  HENT:  Head: Normocephalic.  Right Ear: Tympanic membrane, external ear and ear canal normal.  Left Ear: Tympanic membrane, external ear and ear canal normal.  Nose: Nose normal. Right sinus exhibits no maxillary sinus tenderness and no frontal sinus tenderness. Left sinus exhibits no maxillary sinus tenderness and no frontal sinus tenderness.  Mouth/Throat: Uvula is midline and mucous membranes are normal. Mucous  membranes are not pale and not dry. No uvula swelling. Posterior oropharyngeal erythema present. No oropharyngeal exudate, posterior oropharyngeal edema or tonsillar abscesses.  Eyes: Conjunctivae and lids are normal. Pupils are equal, round, and reactive to light. Right eye exhibits no discharge. Left eye exhibits no discharge. No scleral icterus.  Neck: Trachea normal, normal range of motion and phonation normal. Neck supple. No tracheal deviation present.  Cardiovascular: Normal rate, regular rhythm, S1 normal, S2 normal, normal heart sounds and normal pulses.  Exam reveals no gallop, no distant heart sounds and no friction rub.   No murmur heard. Pulmonary/Chest: Effort normal. No accessory muscle usage or stridor. No respiratory distress. He has no decreased breath sounds. He has wheezes. He has no rhonchi. He has no rales. He exhibits no tenderness.  Wheezing in all lung fields.  Abdominal: Soft. Bowel sounds are normal. There is no tenderness. There is no rebound and no guarding.  Lymphadenopathy:  No tenderness or LAD.  Neurological: He is alert and oriented to person, place, and time. Gait normal.  Skin: Skin is warm, dry and intact. No rash noted. He is not diaphoretic.  Psychiatric: He has a normal mood and affect. His speech is normal and behavior is normal. Judgment and thought content normal. Cognition and memory are normal.  Vitals reviewed.     Assessment & Plan:  1. Acute bronchitis, unspecified organism -Take Z-Pak as prescribed- azithromycin (ZITHROMAX Z-PAK) 250 MG tablet; Take 2 tablets PO on day 1, then take 1 tablet PO QDaily for 4 days.  Dispense: 6 tablet; Refill: 0 -Take Prednisone as prescribed for inflammation- predniSONE (DELTASONE) 20 MG tablet; Take 3 tablets PO QDaily for 3 days, then take 2 tablets PO QDaily for 3 days, then take 1 tablet PO QDaily for 3 days  Dispense: 18 tablet; Refill: 0 -Take Promethazine-Codeine as prescribed for cough- promethazine-codeine  (PHENERGAN WITH CODEINE) 6.25-10 MG/5ML syrup; Take 5 mLs by mouth every 6 (six) hours as needed for cough. Max: 48mL per day  Dispense: 240 mL; Refill: 0 - Take Proair as prescribed for SOB and Wheezing- albuterol (PROAIR HFA) 108 (90 BASE) MCG/ACT inhaler; Inhale 2 puffs into the lungs every 6 (six) hours as needed for wheezing or shortness of breath.  Dispense: 1 Inhaler; Refill: 1 -Continue Mucinex (without DM) OTC  Discussed medication effects and SE's.  Pt agreed to treatment plan. If you are not feeling better in 10-14 days, then please call the office. Please keep your follow up appt on 07/07/14.  Edder Bellanca, Stephani Police, PA-C 10:14 AM [facility]

## 2014-04-29 NOTE — Patient Instructions (Signed)
-   Rest and stay hydrated.  Make sure you drink plenty of fluids to make sure urine is clear when you urinate.  Water will help thin out mucous. - Take Mucinex (without DM) over the counter to thin out and cough up the thick mucous.  Please follow directions on box. -Take Albuterol/Proair as prescribed.  Make sure you rinse your mouth out after each use. -Take Z-Pak as prescribed. -Take Prednisone as prescribed for inflammation. Take Promethazine-Codeine as prescribed for cough.  Bronchitis is mostly caused by viruses, but since you are a former smoker, I am giving you a Z-Pak.  If you are not better in 10-14 days, then please call the office.  Please call the office or message through My Chart if you have any questions.   Acute Bronchitis Bronchitis is when the airways that extend from the windpipe into the lungs get red, puffy, and painful (inflamed). Bronchitis often causes thick spit (mucus) to develop. This leads to a cough. A cough is the most common symptom of bronchitis. In acute bronchitis, the condition usually begins suddenly and goes away over time (usually in 2 weeks). Smoking, allergies, and asthma can make bronchitis worse. Repeated episodes of bronchitis may cause more lung problems.  Most common cause of Bronchitis is viruses (rhinovirus, coronavirus, RSV).  Therefore, not requiring an antibiotic; as antibiotics only treat bacterial infections.  HOME CARE  Rest.  Drink enough fluids to keep your pee (urine) clear or pale yellow (unless you need to limit fluids as told by your doctor).  Only take over-the-counter or prescription medicines as told by your doctor.  Avoid smoking and secondhand smoke. These can make bronchitis worse. If you are a smoker, think about using nicotine gum or skin patches. Quitting smoking will help your lungs heal faster.  Reduce the chance of getting bronchitis again by:  Washing your hands often.  Avoiding people with cold  symptoms.  Trying not to touch your hands to your mouth, nose, or eyes.  Follow up with your doctor as told. GET HELP IF: Your symptoms do not improve after 1 week of treatment. Symptoms include:  Cough.  Fever.  Coughing up thick spit.  Body aches.  Chest congestion.  Chills.  Shortness of breath.  Sore throat. GET HELP RIGHT AWAY IF:   You have an increased fever.  You have chills.  You have severe shortness of breath.  You have bloody thick spit (sputum).  You throw up (vomit) often.  You lose too much body fluid (dehydration).  You have a severe headache.  You faint. MAKE SURE YOU:   Understand these instructions.  Will watch your condition.  Will get help right away if you are not doing well or get worse. Document Released: 11/01/2007 Document Revised: 01/15/2013 Document Reviewed: 11/05/2012 [facility] Patient Information 2015 [place], [place]. This information is not intended to replace advice given to you by your health care provider. Make sure you discuss any questions you have with your health care provider.

## 2014-05-01 ENCOUNTER — Encounter: Payer: Self-pay | Admitting: Internal Medicine

## 2014-06-12 ENCOUNTER — Encounter: Payer: Self-pay | Admitting: Internal Medicine

## 2014-06-12 ENCOUNTER — Ambulatory Visit (AMBULATORY_SURGERY_CENTER): Payer: Medicare Other | Admitting: Internal Medicine

## 2014-06-12 VITALS — BP 169/94 | HR 61 | Temp 97.1°F | Resp 17 | Ht 67.25 in | Wt 216.0 lb

## 2014-06-12 DIAGNOSIS — D122 Benign neoplasm of ascending colon: Secondary | ICD-10-CM

## 2014-06-12 DIAGNOSIS — D124 Benign neoplasm of descending colon: Secondary | ICD-10-CM

## 2014-06-12 DIAGNOSIS — Z86010 Personal history of colonic polyps: Secondary | ICD-10-CM

## 2014-06-12 MED ORDER — SODIUM CHLORIDE 0.9 % IV SOLN: 500.0000 mL | INTRAVENOUS | Status: DC

## 2014-06-12 NOTE — Patient Instructions (Addendum)
I found and removed 3 small polyps that look benign. You also have a condition called diverticulosis - common and not usually a problem. Please read the handout provided. You also have hemorrhoids.  I will let you know pathology results and when to have another routine colonoscopy by mail.  I appreciate the opportunity to care for you. Gatha Mayer, MD, FACG   YOU HAD AN ENDOSCOPIC PROCEDURE TODAY AT [facility]: Refer to the procedure report that was given to you for any specific questions about what was found during the examination.  If the procedure report does not answer your questions, please call your gastroenterologist to clarify.  If you requested that your care partner not be given the details of your procedure findings, then the procedure report has been included in a sealed envelope for you to review at your convenience later.  YOU SHOULD EXPECT: Some feelings of bloating in the abdomen. Passage of more gas than usual.  Walking can help get rid of the air that was put into your GI tract during the procedure and reduce the bloating. If you had a lower endoscopy (such as a colonoscopy or flexible sigmoidoscopy) you may notice spotting of blood in your stool or on the toilet paper. If you underwent a bowel prep for your procedure, then you may not have a normal bowel movement for a few days.  DIET: Your first meal following the procedure should be a light meal and then it is ok to progress to your normal diet.  A half-sandwich or bowl of soup is an example of a good first meal.  Heavy or fried foods are harder to digest and may make you feel nauseous or bloated.  Likewise meals heavy in dairy and vegetables can cause extra gas to form and this can also increase the bloating.  Drink plenty of fluids but you should avoid alcoholic beverages for 24 hours.  ACTIVITY: Your care partner should take you home directly after the procedure.  You should plan to take it easy,  moving slowly for the rest of the day.  You can resume normal activity the day after the procedure however you should NOT DRIVE or use heavy machinery for 24 hours (because of the sedation medicines used during the test).    SYMPTOMS TO REPORT IMMEDIATELY: A gastroenterologist can be reached at any hour.  During normal business hours, 8:30 AM to 5:00 PM Monday through Friday, call [phone].  After hours and on weekends, please call the GI answering service at [phone] who will take a message and have the physician on call contact you.   Following lower endoscopy (colonoscopy or flexible sigmoidoscopy):  Excessive amounts of blood in the stool  Significant tenderness or worsening of abdominal pains  Swelling of the abdomen that is new, acute  Fever of 100F or higher  FOLLOW UP: If any biopsies were taken you will be contacted by phone or by letter within the next 1-3 weeks.  Call your gastroenterologist if you have not heard about the biopsies in 3 weeks.  Our staff will call the home number listed on your records the next business day following your procedure to check on you and address any questions or concerns that you may have at that time regarding the information given to you following your procedure. This is a courtesy call and so if there is no answer at the home number and we have not heard from you through the emergency  physician on call, we will assume that you have returned to your regular daily activities without incident.  SIGNATURES/CONFIDENTIALITY: You and/or your care partner have signed paperwork which will be entered into your electronic medical record.  These signatures attest to the fact that that the information above on your After Visit Summary has been reviewed and is understood.  Full responsibility of the confidentiality of this discharge information lies with you and/or your care-partner.  Recommendations Next colonoscopy determined by pathology  results. Polyp, diverticulosis, and hemorrhoid handouts provided to patient/care partner.

## 2014-06-12 NOTE — Progress Notes (Signed)
A/ox3, pleased with MAC, report to RN 

## 2014-06-12 NOTE — Op Note (Signed)
[facility]  [address] [place], [address]   COLONOSCOPY PROCEDURE REPORT  PATIENT: Andrew Cochran, Andrew Cochran  MR#: [id number] BIRTHDATE: 1940/09/27 , 74  yrs. old GENDER: male ENDOSCOPIST: Gatha Mayer, MD, [facility] PROCEDURE DATE:  06/12/2014 PROCEDURE:   Colonoscopy with biopsy and Colonoscopy with snare polypectomy First Screening Colonoscopy - Avg.  risk and is 50 yrs.  old or older - No.  Prior Negative Screening - Now for repeat screening. N/A  History of Adenoma - Now for follow-up colonoscopy & has been > or = to 3 yrs.  Yes hx of adenoma.  Has been 3 or more years since last colonoscopy.  Polyps Removed Today? Yes. ASA CLASS:   Class II INDICATIONS:surveillance colonoscopy based on a history of adenomatous colonic polyp(s) and + Cologuard also. MEDICATIONS: Propofol 150 mg IV and Monitored anesthesia care  DESCRIPTION OF PROCEDURE:   After the risks benefits and alternatives of the procedure were thoroughly explained, informed consent was obtained.  The digital rectal exam revealed no rectal mass and revealed a mildly enlarged but smooth prostate.   The LB [id number]  endoscope was introduced through the anus and advanced to the cecum, which was identified by both the appendix and ileocecal valve. No adverse events experienced.   The quality of the prep was good, using MiraLax  The instrument was then slowly withdrawn as the colon was fully examined.  COLON FINDINGS: 1) Two diminutive ascending colon polyps removed with cold biopsy. 2) One diminutive descending colon polyp removed with cold snare 3) Left-sided diverticulosis 4) Internal hemorrhoids 5) Otherwise normal colonoscopy.  Retroflexed views revealed no abnormalities. The time to cecum=2 minutes 15 seconds.  Withdrawal time=12 minutes 47 seconds.  The scope was withdrawn and the procedure completed. COMPLICATIONS: There were no immediate complications.  ENDOSCOPIC IMPRESSION: 1) Two  diminutive ascending colon polyps removed with cold biopsy. 2) One diminutive descending colon polyp removed with cold snare 3) Left-sided diverticulosis 4) Internal hemorrhoids 5) Otherwise normal colonoscopy - good prep  RECOMMENDATIONS: Timing of repeat colonoscopy will be determined by pathology findings.  eSigned:  Gatha Mayer, MD, [facility] 06/12/2014 3:44 PM   cc: Unk Pinto, MD and The Patient

## 2014-06-12 NOTE — Progress Notes (Signed)
Called to room to assist during endoscopic procedure.  Patient ID and intended procedure confirmed with present staff. Received instructions for my participation in the procedure from the performing physician.  

## 2014-06-15 ENCOUNTER — Telehealth: Payer: Self-pay | Admitting: *Deleted

## 2014-06-15 NOTE — Telephone Encounter (Signed)
  Follow up Call-  Call back number 06/12/2014  Post procedure Call Back phone  # [phone]  Permission to leave phone message Yes     Patient questions:  Do you have a fever, pain , or abdominal swelling? No. Pain Score  0 *  Have you tolerated food without any problems? Yes.    Have you been able to return to your normal activities? Yes.    Do you have any questions about your discharge instructions: Diet   No. Medications  No. Follow up visit  No.  Do you have questions or concerns about your Care? No.  Actions: * If pain score is 4 or above: No action needed, pain <4.

## 2014-06-18 ENCOUNTER — Encounter: Payer: Self-pay | Admitting: Internal Medicine

## 2014-06-18 DIAGNOSIS — Z86010 Personal history of colonic polyps: Secondary | ICD-10-CM

## 2014-06-18 DIAGNOSIS — Z860101 Personal history of adenomatous and serrated colon polyps: Secondary | ICD-10-CM | POA: Insufficient documentation

## 2014-06-18 HISTORY — DX: Personal history of adenomatous and serrated colon polyps: Z86.0101

## 2014-06-18 HISTORY — DX: Personal history of colonic polyps: Z86.010

## 2014-06-18 NOTE — Progress Notes (Signed)
Quick Note:  3 diminutive adenomas Repeat colonoscopy 2021 ______

## 2014-07-07 ENCOUNTER — Ambulatory Visit: Payer: Self-pay | Admitting: Physician Assistant

## 2014-07-07 ENCOUNTER — Ambulatory Visit (INDEPENDENT_AMBULATORY_CARE_PROVIDER_SITE_OTHER): Payer: Medicare Other | Admitting: Internal Medicine

## 2014-07-07 ENCOUNTER — Encounter: Payer: Self-pay | Admitting: Internal Medicine

## 2014-07-07 VITALS — BP 128/84 | HR 72 | Temp 97.9°F | Resp 16 | Ht 67.25 in | Wt 211.0 lb

## 2014-07-07 DIAGNOSIS — M1990 Unspecified osteoarthritis, unspecified site: Secondary | ICD-10-CM | POA: Insufficient documentation

## 2014-07-07 DIAGNOSIS — Z79899 Other long term (current) drug therapy: Secondary | ICD-10-CM | POA: Diagnosis not present

## 2014-07-07 DIAGNOSIS — I10 Essential (primary) hypertension: Secondary | ICD-10-CM | POA: Diagnosis not present

## 2014-07-07 DIAGNOSIS — E559 Vitamin D deficiency, unspecified: Secondary | ICD-10-CM | POA: Diagnosis not present

## 2014-07-07 DIAGNOSIS — E66811 Obesity, class 1: Secondary | ICD-10-CM | POA: Insufficient documentation

## 2014-07-07 DIAGNOSIS — M159 Polyosteoarthritis, unspecified: Secondary | ICD-10-CM

## 2014-07-07 DIAGNOSIS — R7309 Other abnormal glucose: Secondary | ICD-10-CM

## 2014-07-07 DIAGNOSIS — E039 Hypothyroidism, unspecified: Secondary | ICD-10-CM

## 2014-07-07 DIAGNOSIS — M1000 Idiopathic gout, unspecified site: Secondary | ICD-10-CM

## 2014-07-07 DIAGNOSIS — E669 Obesity, unspecified: Secondary | ICD-10-CM | POA: Insufficient documentation

## 2014-07-07 DIAGNOSIS — R7303 Prediabetes: Secondary | ICD-10-CM

## 2014-07-07 DIAGNOSIS — E785 Hyperlipidemia, unspecified: Secondary | ICD-10-CM | POA: Diagnosis not present

## 2014-07-07 DIAGNOSIS — M353 Polymyalgia rheumatica: Secondary | ICD-10-CM

## 2014-07-07 DIAGNOSIS — M150 Primary generalized (osteo)arthritis: Secondary | ICD-10-CM

## 2014-07-07 LAB — MAGNESIUM: Magnesium: 1.8 mg/dL (ref 1.5–2.5)

## 2014-07-07 LAB — URIC ACID: Uric Acid, Serum: 7 mg/dL (ref 4.0–7.8)

## 2014-07-07 LAB — BASIC METABOLIC PANEL WITH GFR
BUN: 8 mg/dL (ref 6–23)
CALCIUM: 9.6 mg/dL (ref 8.4–10.5)
CO2: 30 meq/L (ref 19–32)
CREATININE: 0.76 mg/dL (ref 0.50–1.35)
Chloride: 103 mEq/L (ref 96–112)
GFR, Est Non African American: >89 mL/min
GLUCOSE: 101 mg/dL — AB (ref 70–99)
Potassium: 4 mEq/L (ref 3.5–5.3)
Sodium: 142 mEq/L (ref 135–145)

## 2014-07-07 LAB — LIPID PANEL
Cholesterol: 211 mg/dL — ABNORMAL HIGH (ref 0–200)
HDL: 51 mg/dL (ref >39)
LDL Cholesterol: 140 mg/dL — ABNORMAL HIGH (ref 0–99)
TRIGLYCERIDES: 98 mg/dL (ref <150)
Total CHOL/HDL Ratio: 4.1 Ratio
VLDL: 20 mg/dL (ref 0–40)

## 2014-07-07 LAB — HEPATIC FUNCTION PANEL
ALT: 35 U/L (ref 0–53)
AST: 30 U/L (ref 0–37)
Albumin: 4.4 g/dL (ref 3.5–5.2)
Alkaline Phosphatase: 77 U/L (ref 39–117)
BILIRUBIN DIRECT: 0.2 mg/dL (ref 0.0–0.3)
BILIRUBIN INDIRECT: 0.7 mg/dL (ref 0.2–1.2)
TOTAL PROTEIN: 7.3 g/dL (ref 6.0–8.3)
Total Bilirubin: 0.9 mg/dL (ref 0.2–1.2)

## 2014-07-07 LAB — HEMOGLOBIN A1C
Hgb A1c MFr Bld: 4.9 % (ref <5.7)
Mean Plasma Glucose: 94 mg/dL (ref <117)

## 2014-07-07 LAB — TSH: TSH: 10.83 u[IU]/mL — AB (ref 0.350–4.500)

## 2014-07-07 MED ORDER — PREDNISONE (PAK) 10 MG PO TABS: ORAL_TABLET | Freq: Every day | ORAL | Status: DC

## 2014-07-07 NOTE — Patient Instructions (Signed)

## 2014-07-07 NOTE — Progress Notes (Signed)
Patient ID: Andrew Cochran, male   DOB: 12-19-1940, 74 y.o.   MRN: [id number]   This very nice 74 y.o. MWM presents for 3 month follow up with Hypertension, Hyperlipidemia, Pre-Diabetes and Vitamin D Deficiency. Patient does have hx/o PMR and he did have a dramatic response to steroids   Patient is treated for HTN & BP has been controlled at home. Today's BP: 128/84 mmHg. Patient has had no complaints of any cardiac type chest pain, palpitations, dyspnea/orthopnea/PND, dizziness, claudication, or dependent edema.   Hyperlipidemia is controlled with diet & meds. Patient denies myalgias or other med SE's. Last Lipids were not at goal -  Total  Chol 215; HDL 38; LDL 154; Trig 116 on 04/02/2014.   Also, the patient has history of PreDiabetes with A1c 6.4% in Apr 2038ffand has had no symptoms of reactive hypoglycemia, diabetic polys, paresthesias or visual blurring.  Last A1c was  5.2% on 04/02/2014.    Further, the patient also has history of Vitamin D Deficiency and supplements vitamin D without any suspected side-effects. Last vitamin D was  61 on  04/02/2014.  Medication Sig  . albuterol (PROAIR HFA) 108 (90 BASE) MCG/ACT inhaler Inhale 2 puffs into the lungs every 6 (six) hours as needed for wheezing or shortness of breath. (Patient not taking: Reported on 06/12/2014)  . allopurinol (ZYLOPRIM) 300 MG tablet Take 1 tablet (300 mg total) by mouth daily. To prevent gout  . Ascorbic Acid (VITAMIN C PO) Take 2,000 mg by mouth daily.   Marland Kitchen atenolol (TENORMIN) 100 MG tablet Take 1 tablet (100 mg total) by mouth daily.  Marland Kitchen BABY ASPIRIN PO Take 81 mg by mouth daily.  . Cholecalciferol (VITAMIN D PO) Take 10,000 Int'l Units by mouth daily.  Marland Kitchen HYDROcodone-acetaminophen (NORCO) 5-325 MG per tablet Take 1 tablet by mouth every 6 (six) hours as needed for moderate pain. (Patient not taking: Reported on 06/12/2014)  . hydrocortisone (PROCTOCORT) 1 % CREA Apply 1 application topically 2 (two) times daily. Into anal canal  (Patient not taking: Reported on 06/12/2014)  . levothyroxine (SYNTHROID, LEVOTHROID) 200 MCG tablet TAKE ONE AND ONE-HALF TABLETS BY MOUTH EVERY DAY  . losartan (COZAAR) 100 MG tablet Take 1 tablet (100 mg total) by mouth daily. (Patient not taking: Reported on 06/12/2014)  . MAGNESIUM PO Take 250 mg by mouth 3 (three) times daily.   Marland Kitchen olmesartan (BENICAR) 20 MG tablet Take 1 tablet (20 mg total) by mouth daily. (Patient not taking: Reported on 06/12/2014)  . Omega-3 Fatty Acids (FISH OIL PO) Take 1,000 mg by mouth daily.   . ONE TOUCH ULTRA TEST test strip use once daily as directed to test blood sugar (Patient not taking: Reported on 06/12/2014)  . predniSONE (DELTASONE) 20 MG tablet   . Red Yeast Rice Extract (RED YEAST RICE PO) Take 1,000 mg by mouth daily.    Allergies  Allergen Reactions  . Lotensin [Benazepril Hcl]     unknown  . Penicillins Rash   PMHx:   Past Medical History  Diagnosis Date  . Gout   . Cataracts, bilateral   . Hypertension   . Thyroid disease   . BPH with elevated PSA   . Prediabetes   . Hyperlipidemia   . Diverticulosis   . Tubular adenoma 02/17/2003  . Hx of adenomatous colonic polyps 06/18/2014   Immunization History  Administered Date(s) Administered  . Pneumococcal Polysaccharide-23 08/19/2008  . Td 09/07/2009   Past Surgical History  Procedure Laterality Date  .  Cataract extraction Right 2009  . Shoulder arthroscopy Right     1979  . Colonoscopy w/ biopsies     FHx:    Reviewed / unchanged  SHx:    Reviewed / unchanged  Systems Review:  Constitutional: Denies fever, chills, wt changes, headaches, insomnia, fatigue, night sweats, change in appetite. Eyes: Denies redness, blurred vision, diplopia, discharge, itchy, watery eyes.  ENT: Denies discharge, congestion, post nasal drip, epistaxis, sore throat, earache, hearing loss, dental pain, tinnitus, vertigo, sinus pain, snoring.  CV: Denies chest pain, palpitations, irregular heartbeat,  syncope, dyspnea, diaphoresis, orthopnea, PND, claudication or edema. Respiratory: denies cough, dyspnea, DOE, pleurisy, hoarseness, laryngitis, wheezing.  Gastrointestinal: Denies dysphagia, odynophagia, heartburn, reflux, water brash, abdominal pain or cramps, nausea, vomiting, bloating, diarrhea, constipation, hematemesis, melena, hematochezia  or hemorrhoids. Genitourinary: Denies dysuria, frequency, urgency, nocturia, hesitancy, discharge, hematuria or flank pain. Musculoskeletal: Denies arthralgias, myalgias, stiffness, jt. swelling, pain, limping or strain/sprain.  Skin: Denies pruritus, rash, hives, warts, acne, eczema or change in skin lesion(s). Neuro: No weakness, tremor, incoordination, spasms, paresthesia or pain. Psychiatric: Denies confusion, memory loss or sensory loss. Endo: Denies change in weight, skin or hair change.  Heme/Lymph: No excessive bleeding, bruising or enlarged lymph nodes.  Physical Exam  BP 128/84   Pulse 72  Temp 97.9 F   Resp 16  Ht 5' 7.25"   Wt 211 lb     BMI 32.81   Appears well nourished and in no distress. Eyes: PERRLA, EOMs, conjunctiva no swelling or erythema. Sinuses: No frontal/maxillary tenderness ENT/Mouth: EAC's clear, TM's nl w/o erythema, bulging. Nares clear w/o erythema, swelling, exudates. Oropharynx clear without erythema or exudates. Oral hygiene is good. Tongue normal, non obstructing. Hearing intact.  Neck: Supple. Thyroid nl. Car 2+/2+ without bruits, nodes or JVD. Chest: Kyphotic with increased  with BS distant& equal w/o rales, rhonchi, wheezing or stridor.  Cor: Heart sounds normal w/ regular rate and rhythm without sig. murmurs, gallops, clicks, or rubs. Peripheral pulses normal and equal  without edema.  Abdomen: Soft & bowel sounds normal. Non-tender w/o guarding, rebound, hernias, masses, or organomegaly.  Lymphatics: Unremarkable.  Musculoskeletal: Full ROM all peripheral extremities, joint stability, 5/5 strength, and  normal gait.  Skin: Warm, dry without exposed rashes, lesions or ecchymosis apparent.  Neuro: Cranial nerves intact, reflexes equal bilaterally. Sensory-motor testing grossly intact. Tendon reflexes grossly intact.  Pysch: Alert & oriented x 3.  Insight and judgement nl & appropriate. No ideations.  Assessment and Plan:  1. Essential hypertension   2. Hyperlipidemia  - Lipid panel  3. Prediabetes  - Hemoglobin A1c - Insulin, fasting  4. Vitamin D deficiency  - Vit D  25 hydroxy   5. Hypothyroidism  - TSH  6. Idiopathic gout   - Uric acid  7. Medication management  - CBC with Differential/Platelet - BASIC METABOLIC PANEL WITH GFR - Hepatic function panel - Magnesium  8. Morbid obesity (BMI 33.9)   9. Primary osteoarthritis involving multiple joints   10. PMR (2011)    Recommended regular exercise, BP monitoring, weight control, and discussed med and SE's. Recommended labs to assess and monitor clinical status. Further disposition pending results of labs.

## 2014-07-08 ENCOUNTER — Other Ambulatory Visit: Payer: Self-pay | Admitting: Internal Medicine

## 2014-07-08 LAB — CBC WITH DIFFERENTIAL/PLATELET
BASOS ABS: 0.1 10*3/uL (ref 0.0–0.1)
Basophils Relative: 1 % (ref 0–1)
Eosinophils Absolute: 0.2 10*3/uL (ref 0.0–0.7)
Eosinophils Relative: 3 % (ref 0–5)
HCT: 47.2 % (ref 39.0–52.0)
Hemoglobin: 16.5 g/dL (ref 13.0–17.0)
Lymphocytes Relative: 22 % (ref 12–46)
Lymphs Abs: 1.4 10*3/uL (ref 0.7–4.0)
MCH: 31.8 pg (ref 26.0–34.0)
MCHC: 35 g/dL (ref 30.0–36.0)
MCV: 90.9 fL (ref 78.0–100.0)
MPV: 9.7 fL (ref 8.6–12.4)
Monocytes Absolute: 0.5 10*3/uL (ref 0.1–1.0)
Monocytes Relative: 8 % (ref 3–12)
NEUTROS ABS: 4.3 10*3/uL (ref 1.7–7.7)
NEUTROS PCT: 66 % (ref 43–77)
PLATELETS: 218 10*3/uL (ref 150–400)
RBC: 5.19 MIL/uL (ref 4.22–5.81)
RDW: 14 % (ref 11.5–15.5)
WBC: 6.5 10*3/uL (ref 4.0–10.5)

## 2014-07-08 LAB — INSULIN, FASTING: Insulin fasting, serum: 7.5 u[IU]/mL (ref 2.0–19.6)

## 2014-07-08 LAB — VITAMIN D 25 HYDROXY (VIT D DEFICIENCY, FRACTURES): Vit D, 25-Hydroxy: 57 ng/mL (ref 30–100)

## 2014-08-01 ENCOUNTER — Other Ambulatory Visit: Payer: Self-pay | Admitting: Emergency Medicine

## 2014-09-29 ENCOUNTER — Encounter: Payer: Self-pay | Admitting: Internal Medicine

## 2014-10-13 ENCOUNTER — Encounter: Payer: Self-pay | Admitting: Internal Medicine

## 2014-10-13 ENCOUNTER — Ambulatory Visit (INDEPENDENT_AMBULATORY_CARE_PROVIDER_SITE_OTHER): Payer: Medicare Other | Admitting: Internal Medicine

## 2014-10-13 VITALS — BP 138/82 | HR 68 | Temp 97.5°F | Resp 16 | Ht 66.75 in | Wt 198.3 lb

## 2014-10-13 DIAGNOSIS — E039 Hypothyroidism, unspecified: Secondary | ICD-10-CM | POA: Diagnosis not present

## 2014-10-13 DIAGNOSIS — I10 Essential (primary) hypertension: Secondary | ICD-10-CM

## 2014-10-13 DIAGNOSIS — M109 Gout, unspecified: Secondary | ICD-10-CM | POA: Diagnosis not present

## 2014-10-13 DIAGNOSIS — R972 Elevated prostate specific antigen [PSA]: Secondary | ICD-10-CM

## 2014-10-13 DIAGNOSIS — Z125 Encounter for screening for malignant neoplasm of prostate: Secondary | ICD-10-CM

## 2014-10-13 DIAGNOSIS — Z9181 History of falling: Secondary | ICD-10-CM

## 2014-10-13 DIAGNOSIS — Z0001 Encounter for general adult medical examination with abnormal findings: Secondary | ICD-10-CM

## 2014-10-13 DIAGNOSIS — M150 Primary generalized (osteo)arthritis: Secondary | ICD-10-CM

## 2014-10-13 DIAGNOSIS — E559 Vitamin D deficiency, unspecified: Secondary | ICD-10-CM

## 2014-10-13 DIAGNOSIS — R7303 Prediabetes: Secondary | ICD-10-CM

## 2014-10-13 DIAGNOSIS — E785 Hyperlipidemia, unspecified: Secondary | ICD-10-CM

## 2014-10-13 DIAGNOSIS — M1000 Idiopathic gout, unspecified site: Secondary | ICD-10-CM

## 2014-10-13 DIAGNOSIS — Z1212 Encounter for screening for malignant neoplasm of rectum: Secondary | ICD-10-CM

## 2014-10-13 DIAGNOSIS — R7309 Other abnormal glucose: Secondary | ICD-10-CM

## 2014-10-13 DIAGNOSIS — R6889 Other general symptoms and signs: Secondary | ICD-10-CM

## 2014-10-13 DIAGNOSIS — N400 Enlarged prostate without lower urinary tract symptoms: Secondary | ICD-10-CM

## 2014-10-13 DIAGNOSIS — M159 Polyosteoarthritis, unspecified: Secondary | ICD-10-CM

## 2014-10-13 DIAGNOSIS — Z79899 Other long term (current) drug therapy: Secondary | ICD-10-CM

## 2014-10-13 DIAGNOSIS — M353 Polymyalgia rheumatica: Secondary | ICD-10-CM

## 2014-10-13 DIAGNOSIS — Z0000 Encounter for general adult medical examination without abnormal findings: Secondary | ICD-10-CM

## 2014-10-13 DIAGNOSIS — Z1331 Encounter for screening for depression: Secondary | ICD-10-CM

## 2014-10-13 LAB — TSH: TSH: 7.335 u[IU]/mL — ABNORMAL HIGH (ref 0.350–4.500)

## 2014-10-13 LAB — CBC WITH DIFFERENTIAL/PLATELET
Basophils Absolute: 0.1 10*3/uL (ref 0.0–0.1)
Basophils Relative: 1 % (ref 0–1)
Eosinophils Absolute: 0.2 10*3/uL (ref 0.0–0.7)
Eosinophils Relative: 3 % (ref 0–5)
HEMATOCRIT: 44.2 % (ref 39.0–52.0)
Hemoglobin: 15.6 g/dL (ref 13.0–17.0)
LYMPHS ABS: 1.5 10*3/uL (ref 0.7–4.0)
Lymphocytes Relative: 20 % (ref 12–46)
MCH: 31.8 pg (ref 26.0–34.0)
MCHC: 35.3 g/dL (ref 30.0–36.0)
MCV: 90.2 fL (ref 78.0–100.0)
MONOS PCT: 6 % (ref 3–12)
MPV: 10 fL (ref 8.6–12.4)
Monocytes Absolute: 0.4 10*3/uL (ref 0.1–1.0)
NEUTROS ABS: 5.2 10*3/uL (ref 1.7–7.7)
NEUTROS PCT: 70 % (ref 43–77)
Platelets: 213 10*3/uL (ref 150–400)
RBC: 4.9 MIL/uL (ref 4.22–5.81)
RDW: 13.8 % (ref 11.5–15.5)
WBC: 7.4 10*3/uL (ref 4.0–10.5)

## 2014-10-13 LAB — MAGNESIUM: MAGNESIUM: 1.8 mg/dL (ref 1.5–2.5)

## 2014-10-13 LAB — HEPATIC FUNCTION PANEL
ALBUMIN: 4 g/dL (ref 3.5–5.2)
ALT: 30 U/L (ref 0–53)
AST: 27 U/L (ref 0–37)
Alkaline Phosphatase: 72 U/L (ref 39–117)
Bilirubin, Direct: 0.2 mg/dL (ref 0.0–0.3)
Indirect Bilirubin: 0.7 mg/dL (ref 0.2–1.2)
Total Bilirubin: 0.9 mg/dL (ref 0.2–1.2)
Total Protein: 6.8 g/dL (ref 6.0–8.3)

## 2014-10-13 LAB — BASIC METABOLIC PANEL WITH GFR
BUN: 12 mg/dL (ref 6–23)
CHLORIDE: 100 meq/L (ref 96–112)
CO2: 27 meq/L (ref 19–32)
Calcium: 9.3 mg/dL (ref 8.4–10.5)
Creat: 0.77 mg/dL (ref 0.50–1.35)
GFR, Est African American: >89 mL/min
GFR, Est Non African American: >89 mL/min
Glucose, Bld: 86 mg/dL (ref 70–99)
Potassium: 4.4 mEq/L (ref 3.5–5.3)
SODIUM: 139 meq/L (ref 135–145)

## 2014-10-13 LAB — LIPID PANEL
CHOL/HDL RATIO: 3.8 ratio
Cholesterol: 198 mg/dL (ref 0–200)
HDL: 52 mg/dL (ref >=40)
LDL Cholesterol: 125 mg/dL — ABNORMAL HIGH (ref 0–99)
Triglycerides: 105 mg/dL (ref <150)
VLDL: 21 mg/dL (ref 0–40)

## 2014-10-13 LAB — SEDIMENTATION RATE: Sed Rate: 1 mm/hr (ref 0–20)

## 2014-10-13 LAB — HEMOGLOBIN A1C
Hgb A1c MFr Bld: 4.8 % (ref <5.7)
Mean Plasma Glucose: 91 mg/dL (ref <117)

## 2014-10-13 LAB — URIC ACID: Uric Acid, Serum: 6.5 mg/dL (ref 4.0–7.8)

## 2014-10-13 NOTE — Progress Notes (Addendum)
Patient ID: Andrew Cochran, male   DOB: [birth year], 74 y.o.   MRN: [id number]  [facility] VISIT AND CPE  Assessment:   1. Essential hypertension  - Microalbumin / creatinine urine ratio - EKG 12-Lead - [place], RETROPERITNL ABD,  LTD  2. Hyperlipidemia  - Lipid panel  3. Prediabetes  - Hemoglobin A1c - Insulin, random  4. Vitamin D deficiency  - Vit D  25 hydroxy   5. Hypothyroidism  - TSH  6. BPH with elevated PSA  - PSA  7. Primary osteoarthritis    8. Idiopathic gout  - Uric acid  9. PMR (2011)  - Sedimentation rate  10. Screening for rectal cancer   11. Prostate cancer screening  - PSA  12. Depression screen   13. At low risk for fall   14. Medication management  - Urine Microscopic - CBC with Differential/Platelet - BASIC METABOLIC PANEL WITH GFR - Hepatic function panel - Magnesium  15. Routine general medical examination at a health care facility   Plan:   During the course of the visit the patient was educated and counseled about appropriate screening and preventive services including:    Pneumococcal vaccine   Influenza vaccine  Td vaccine  Screening electrocardiogram  Bone densitometry screening  Colorectal cancer screening  Diabetes screening  Glaucoma screening  Nutrition counseling   Advanced directives: requested  Screening recommendations, referrals: Vaccinations: Immunization History  Administered Date(s) Administered  . Pneumococcal Polysaccharide-23 08/19/2008  . Td 09/07/2009  Influenza vaccine declined Prevnar vaccine not available Shingles vaccine declined Hep B vaccine not indicated  Nutrition assessed and recommended  Colonoscopy 06/12/2013 - Negative Recommended yearly ophthalmology/optometry visit for glaucoma screening and checkup Recommended yearly dental visit for hygiene and checkup Advanced directives - no - offered forms  Conditions/risks identified: BMI: Discussed  weight loss, diet, and increase physical activity.  Increase physical activity: AHA recommends 150 minutes of physical activity a week.  Medications reviewed Diabetes is at goal, ACE/ARB therapy: Not Indicated Urinary Incontinence is not an issue: discussed non pharmacology and pharmacology options.  Fall risk: low- discussed PT, home fall assessment, medications.   Subjective:    Andrew Cochran presents for Medicare Annual Wellness Visit and complete physical.  Date of last medicare wellness visit was 12/26/2013.  This very nice 74 y.o. MWM presents for follow up with Hypertension, Hyperlipidemia, Pre-Diabetes and Vitamin D Deficiency. Patient also has hx/o PMR in 2011 wich recover with steroid tx and as sx's resolved, he tapered off of steroids.    Patient is treated for HTN since 1997 & BP has been controlled at home. He did have a non-focal TIA in 2008 and has had no subsequent episodes since then. Today's BP: 138/82 mmHg. Patient has had no complaints of any cardiac type chest pain, palpitations, dyspnea/orthopnea/PND, dizziness, claudication, or dependent edema.   Hyperlipidemia is controlled with diet & meds. Patient denies myalgias or other med SE's. Last Lipids were not at goal - Total Chol 211; HDL 51; elevated LDL 140; Triglycerides 98 on 07/07/2014.     Also, the patient has history of PreDiabetes with A1c 6.4% in 2011, but A1c's have normalized since judicious diet & weight loss and he's had no symptoms of reactive hypoglycemia, diabetic polys, paresthesias or visual blurring.  Last A1c was 4.9% on 07/07/2014.    Further, the patient also has history of Vitamin D Deficiency 36 in 2008 and supplements vitamin D without any suspected side-effects. Last vitamin D was 57 on  07/07/2014.  Names of Other Physician/Practitioners you currently use: 1.  [facility] here for primary care 2. Dr Katy Fitch, eye doctor, last visit 4-5 yr ago & patient was encouraged to  f/u  3. Dr Carlye Grippe, dentist, last visit every 6 months  Patient Care Team: Unk Pinto, MD as PCP - General (Internal Medicine) [facility] Grant Fontana., MD as Attending Physician (Urology) Gatha Mayer, MD as Consulting Physician (Gastroenterology) Clent Jacks, MD as Consulting Physician (Ophthalmology)  Medication Review: Medication Sig  . allopurinol 300 MG tablet Take 1 tablet (300 mg total) by mouth daily. To prevent gout  . VITAMIN C  Take 2,000 mg by mouth daily.   Marland Kitchen atenolol  100 MG tablet Take 1 tablet (100 mg total) by mouth daily.  Marland Kitchen BABY ASPIRIN  Take 81 mg by mouth daily.  Marland Kitchen VITAMIN D  Take 10,000 Int'l Units by mouth daily.  Marland Kitchen levothyroxine  200 MCG tablet TAKE ONE AND ONE-HALF TABLETS BY MOUTH ONCE DAILY  . MAGNESIUM  Take 250 mg by mouth 3 (three) times daily.   Marland Kitchen FISH OIL  Take 1,000 mg by mouth daily.   . predniSONE  20 MG tablet TAKE ONE TABLET BY MOUTH THREE TIMES DAILY AS  DIRECTED  . Red Yeast Rice Extract  Take 1,000 mg by mouth daily.    Current Problems (verified) Patient Active Problem List   Diagnosis Date Noted  . Morbid obesity (BMI 33.9) 07/07/2014  . DJD  07/07/2014  . PMR (2011) 07/07/2014  . Hx of adenomatous colonic polyps 06/18/2014  . Umbilical hernia 17/49/4496  . Prediabetes 09/25/2013  . Vitamin D deficiency 09/25/2013  . Medication management 09/25/2013  . Cataracts, bilateral   . Gout   . Hypertension   . Hypothyroidism   . BPH with elevated PSA   . Hyperlipidemia    Screening Tests Health Maintenance  Topic Date Due  . ZOSTAVAX  08/23/2002  . PNA vac Low Risk Adult (2 of 2 - PCV13) 08/19/2009  . INFLUENZA VACCINE  12/28/2014  . COLONOSCOPY  06/12/2017  . TETANUS/TDAP  09/08/2019   Immunization History  Administered Date(s) Administered  . Pneumococcal Polysaccharide-23 08/19/2008  . Td 09/07/2009   Preventative care: Last colonoscopy: (+) cologard in Aug/ 2015 and Colonoscopy in Jan 2016 was Negative - Dr  Carlean Purl  History reviewed: allergies, current medications, past family history, past medical history, past social history, past surgical history and problem list  Risk Factors: Tobacco History  Substance Use Topics  . Smoking status: Former Smoker    Quit date: 05/30/1959  . Smokeless tobacco: Never Used  . Alcohol Use: No   He does not smoke.  Patient is a former smoker. Are there smokers in your home (other than you)?  No  Alcohol Current alcohol use: none - quit 02/26/2014  Caffeine Current caffeine use: coffee 2-3 cups /day  Exercise Current exercise: none  Nutrition/Diet Current diet: in general, a "healthy" diet    Cardiac risk factors: advanced age (older than 59 for men, 35 for women), dyslipidemia, hypertension, male gender, obesity (BMI >= 30 kg/m2), sedentary lifestyle and smoking/ tobacco exposure.  Depression Screen (Note: if answer to either of the following is "Yes", a more complete depression screening is indicated)   Q1: Over the past two weeks, have you felt down, depressed or hopeless? No  Q2: Over the past two weeks, have you felt little interest or pleasure in doing things? No  Have you lost interest  or pleasure in daily life? No  Do you often feel hopeless? No  Do you cry easily over simple problems? No  Activities of Daily Living In your present state of health, do you have any difficulty performing the following activities?:  Driving? No Managing money?  No Feeding yourself? No Getting from bed to chair? No Climbing a flight of stairs? No Preparing food and eating?: No Bathing or showering? No Getting dressed: No Getting to the toilet? No Using the toilet:No Moving around from place to place: No In the past year have you fallen or had a near fall?:No   Are you sexually active?  No  Do you have more than one partner?  No  Vision Difficulties: No  Hearing Difficulties: No Do you often ask people to speak up or repeat themselves? No Do  you experience ringing or noises in your ears? No Do you have difficulty understanding soft or whispered voices? No  Cognition  Do you feel that you have a problem with memory?No  Do you often misplace items? No  Do you feel safe at home?  Yes  Advanced directives Does patient have a [place]? No- offered forms Does patient have a Living Will? No - offered forms  Past Medical History  Diagnosis Date  . Gout   . Cataracts, bilateral   . Hypertension   . Thyroid disease   . BPH with elevated PSA   . Prediabetes   . Hyperlipidemia   . Diverticulosis   . Tubular adenoma 02/17/2003  . Hx of adenomatous colonic polyps 06/18/2014   Past Surgical History  Procedure Laterality Date  . Cataract extraction Right 2009  . Shoulder arthroscopy Right     1979  . Colonoscopy w/ biopsies     ROS: Constitutional: Denies fever, chills, weight loss/gain, headaches, insomnia, fatigue, night sweats or change in appetite. Eyes: Denies redness, blurred vision, diplopia, discharge, itchy or watery eyes.  ENT: Denies discharge, congestion, post nasal drip, epistaxis, sore throat, earache, hearing loss, dental pain, Tinnitus, Vertigo, Sinus pain or snoring.  Cardio: Denies chest pain, palpitations, irregular heartbeat, syncope, dyspnea, diaphoresis, orthopnea, PND, claudication or edema Respiratory: denies cough, dyspnea, DOE, pleurisy, hoarseness, laryngitis or wheezing.  Gastrointestinal: Denies dysphagia, heartburn, reflux, water brash, pain, cramps, nausea, vomiting, bloating, diarrhea, constipation, hematemesis, melena, hematochezia, jaundice or hemorrhoids Genitourinary: Denies dysuria, frequency, urgency, nocturia, hesitancy, discharge, hematuria or flank pain Musculoskeletal: Denies arthralgia, myalgia, stiffness, Jt. Swelling, pain, limp or strain/sprain. Denies Falls. Skin: Denies puritis, rash, hives, warts, acne, eczema or change in skin lesion Neuro: No weakness,  tremor, incoordination, spasms, paresthesia or pain Psychiatric: Denies confusion, memory loss or sensory loss. Denies Depression. Endocrine: Denies change in weight, skin, hair change, nocturia, and paresthesia, diabetic polys, visual blurring or hyper / hypo glycemic episodes.  Heme/Lymph: No excessive bleeding, bruising or enlarged lymph nodes.  Objective:     BP 138/82   Pulse 68  Temp  97.5 F   Resp 16  Ht 5' 6.75"   Wt 198 lb 5.4 oz    BMI 31.31  General Appearance:  Alert  WD/WN, male  in no apparent distress. Eyes: PERRLA, EOMs nl, conjunctiva normal, normal fundi and vessels. Sinuses: No frontal/maxillary tenderness ENT/Mouth: EACs patent / TMs  nl. Nares clear without erythema, swelling, mucoid exudates. Oral hygiene is good. No erythema, swelling, or exudate. Tongue normal, non-obstructing. Tonsils not swollen or erythematous. Hearing normal.  Neck: Supple, thyroid normal. No bruits, nodes or JVD. Respiratory:  Respiratory effort normal.  BS equal and clear bilateral without rales, rhonci, wheezing or stridor. Cardio: Heart sounds are normal with regular rate and rhythm and no murmurs, rubs or gallops. Peripheral pulses are normal and equal bilaterally without edema. No aortic or femoral bruits. Chest: symmetric with normal excursions and percussion.  Abdomen: Flat, soft, with nl bowel sounds. Nontender, no guarding, rebound, hernias, masses, or organomegaly.  Lymphatics: Non tender without lymphadenopathy.  Genitourinary: No hernias.Testes nl. DRE - prostate nl for age - smooth & firm w/o nodules. Musculoskeletal: Full ROM all peripheral extremities, joint stability, 5/5 strength, and normal gait. Skin: Warm and dry without rashes, lesions, cyanosis, clubbing or  ecchymosis.  Neuro: Cranial nerves intact, reflexes equal bilaterally. Normal muscle tone, no cerebellar symptoms. Sensation intact.  Pysch: Alert and oriented X 3 with normal affect, insight and judgment  appropriate.   Cognitive Testing  Alert? Yes  Normal Appearance? Yes  Oriented to person? Yes  Place? Yes   Time? Yes  Recall of three objects?  Yes  Can perform simple calculations? Yes  Displays appropriate judgment? Yes  Can read the correct time from a watch/clock? Yes  Medicare Attestation I have personally reviewed: The patient's medical and social history Their use of alcohol, tobacco or illicit drugs Their current medications and supplements The patient's functional ability including ADLs,fall risks, home safety risks, cognitive, and hearing and visual impairment Diet and physical activities Evidence for depression or mood disorders  The patient's weight, height, BMI, and visual acuity have been recorded in the chart.  I have made referrals, counseling, and provided education to the patient based on review of the above and I have provided the patient with a written personalized care plan for preventive services.  Over 40 minutes of exam, counseling, chart review was performed.  Shain Pauwels DAVID, MD   10/13/2014

## 2014-10-13 NOTE — Patient Instructions (Signed)
++++++++++++++++++++++++++++++++++  Recommend Low dose or baby Aspirin 81 mg daily   To reduce risk of Colon Cancer 20 %, Skin Cancer 26 % , Melanoma 46% and   Pancreatic cancer 60%  +++++++++++++++++++++++++++++++++ Vitamin D goal is between 70-100.   Please make sure that you are taking your Vitamin D as directed.   It is very important as a natural antiinflammatory   helping hair, skin, and nails, as well as reducing stroke and heart attack risk.   It helps your bones and helps with mood.  It also decreases numerous cancer risks so please take it as directed.   Low Vit D is associated with a 200-300% higher risk for CANCER   and 200-300% higher risk for HEART   ATTACK  &  STROKE.    ...................................................................................  It is also associated with higher death rate at younger ages,   autoimmune diseases like Rheumatoid arthritis, Lupus, Multiple Sclerosis.     Also many other serious conditions, like depression, Alzheimer's  Dementia, infertility, muscle aches, fatigue, fibromyalgia - just to name a few.  ++++++++++++++++++++++++++++++++++++++++ Recommend the book "The END of DIETING" by Dr Joel Fuhrman   & the book "The END of DIABETES " by Dr Joel Fuhrman  At [employer] - get book & Audio CD's     Being diabetic has a  300% increased risk for heart attack, stroke, cancer, and alzheimer- type vascular dementia. It is very important that you work harder with diet by avoiding all foods that are white. Avoid white rice (brown & wild rice is OK), white potatoes (sweetpotatoes in moderation is OK), White bread or wheat bread or anything made out of white flour like bagels, donuts, rolls, buns, biscuits, cakes, pastries, cookies, pizza crust, and pasta (made from white flour & egg whites) - vegetarian pasta or spinach or wheat pasta is OK. Multigrain breads like Arnold's or [employer], or multigrain sandwich thins or  flatbreads.  Diet, exercise and weight loss can reverse and cure diabetes in the early stages.  Diet, exercise and weight loss is very important in the control and prevention of complications of diabetes which affects every system in your body, ie. Brain - dementia/stroke, eyes - glaucoma/blindness, heart - heart attack/heart failure, kidneys - dialysis, stomach - gastric paralysis, intestines - malabsorption, nerves - severe painful neuritis, circulation - gangrene & loss of a leg(s), and finally cancer and Alzheimers.    I recommend avoid fried & greasy foods,  sweets/candy, white rice (brown or wild rice or Quinoa is OK), white potatoes (sweet potatoes are OK) - anything made from white flour - bagels, doughnuts, rolls, buns, biscuits,white and wheat breads, pizza crust and traditional pasta made of white flour & egg white(vegetarian pasta or spinach or wheat pasta is OK).  Multi-grain bread is OK - like multi-grain flat bread or sandwich thins. Avoid alcohol in excess. Exercise is also important.    Eat all the vegetables you want - avoid meat, especially red meat and dairy - especially cheese.  Cheese is the most concentrated form of trans-fats which is the worst thing to clog up our arteries. Veggie cheese is OK which can be found in the fresh produce section at [employer] or [employer] or [employer]  ++++++++++++++++++++++++++++++++++++++++++++++++++++++++ Preventive Care for Adults A healthy lifestyle and preventive care can promote health and wellness. Preventive health guidelines for men include the following key practices:  A routine yearly physical is a good way to check with your health care provider about your   health and preventative screening. It is a chance to share any concerns and updates on your health and to receive a thorough exam.  Visit your dentist for a routine exam and preventative care every 6 months. Brush your teeth twice a day and floss once a day. Good oral hygiene  prevents tooth decay and gum disease.  The frequency of eye exams is based on your age, health, family medical history, use of contact lenses, and other factors. Follow your health care provider's recommendations for frequency of eye exams.  Eat a healthy diet. Foods such as vegetables, fruits, whole grains, low-fat dairy products, and lean protein foods contain the nutrients you need without too many calories. Decrease your intake of foods high in solid fats, added sugars, and salt. Eat the right amount of calories for you.Get information about a proper diet from your health care provider, if necessary.  Regular physical exercise is one of the most important things you can do for your health. Most adults should get at least 150 minutes of moderate-intensity exercise (any activity that increases your heart rate and causes you to sweat) each week. In addition, most adults need muscle-strengthening exercises on 2 or more days a week.  Maintain a healthy weight. The body mass index (BMI) is a screening tool to identify possible weight problems. It provides an estimate of body fat based on height and weight. Your health care provider can find your BMI and can help you achieve or maintain a healthy weight.For adults 20 years and older:  A BMI below 18.5 is considered underweight.  A BMI of 18.5 to 24.9 is normal.  A BMI of 25 to 29.9 is considered overweight.  A BMI of 30 and above is considered obese.  Maintain normal blood lipids and cholesterol levels by exercising and minimizing your intake of saturated fat. Eat a balanced diet with plenty of fruit and vegetables. Blood tests for lipids and cholesterol should begin at age 20 and be repeated every 5 years. If your lipid or cholesterol levels are high, you are over 50, or you are at high risk for heart disease, you may need your cholesterol levels checked more frequently.Ongoing high lipid and cholesterol levels should be treated with medicines if  diet and exercise are not working.  If you smoke, find out from your health care provider how to quit. If you do not use tobacco, do not start.  Lung cancer screening is recommended for adults aged 55-80 years who are at high risk for developing lung cancer because of a history of smoking. A yearly low-dose CT scan of the lungs is recommended for people who have at least a 30-pack-year history of smoking and are a current smoker or have quit within the past 15 years. A pack year of smoking is smoking an average of 1 pack of cigarettes a day for 1 year (for example: 1 pack a day for 30 years or 2 packs a day for 15 years). Yearly screening should continue until the smoker has stopped smoking for at least 15 years. Yearly screening should be stopped for people who develop a health problem that would prevent them from having lung cancer treatment.  If you choose to drink alcohol, do not have more than 2 drinks per day. One drink is considered to be 12 ounces (355 mL) of beer, 5 ounces (148 mL) of wine, or 1.5 ounces (44 mL) of liquor.  Avoid use of street drugs. Do not share needles with anyone. Ask   for help if you need support or instructions about stopping the use of drugs.  High blood pressure causes heart disease and increases the risk of stroke. Your blood pressure should be checked at least every 1-2 years. Ongoing high blood pressure should be treated with medicines, if weight loss and exercise are not effective.  If you are 45-79 years old, ask your health care provider if you should take aspirin to prevent heart disease.  Diabetes screening involves taking a blood sample to check your fasting blood sugar level. Testing should be considered at a younger age or be carried out more frequently if you are overweight and have at least 1 risk factor for diabetes.  Colorectal cancer can be detected and often prevented. Most routine colorectal cancer screening begins at the age of 50 and continues  through age 75. However, your health care provider may recommend screening at an earlier age if you have risk factors for colon cancer. On a yearly basis, your health care provider may provide home test kits to check for hidden blood in the stool. Use of a small camera at the end of a tube to directly examine the colon (sigmoidoscopy or colonoscopy) can detect the earliest forms of colorectal cancer. Talk to your health care provider about this at age 50, when routine screening begins. Direct exam of the colon should be repeated every 5-10 years through age 75, unless early forms of precancerous polyps or small growths are found.  Hepatitis C blood testing is recommended for all people born from 1945 through 1965 and any individual with known risks for hepatitis C.  Screening for abdominal aortic aneurysm (AAA)  by ultrasound is recommended for people who have history of high blood pressure or who are current or former smokers.  Healthy men should  receive prostate-specific antigen (PSA) blood tests as part of routine cancer screening. Talk with your health care provider about prostate cancer screening.  Testicular cancer screening is  recommended for adult males. Screening includes self-exam, a health care provider exam, and other screening tests. Consult with your health care provider about any symptoms you have or any concerns you have about testicular cancer.  Use sunscreen. Apply sunscreen liberally and repeatedly throughout the day. You should seek shade when your shadow is shorter than you. Protect yourself by wearing long sleeves, pants, a wide-brimmed hat, and sunglasses year round, whenever you are outdoors.  Once a month, do a whole-body skin exam, using a mirror to look at the skin on your back. Tell your health care provider about new moles, moles that have irregular borders, moles that are larger than a pencil eraser, or moles that have changed in shape or color.  Stay current with  required vaccines (immunizations).  Influenza vaccine. All adults should be immunized every year.  Tetanus, diphtheria, and acellular pertussis (Td, Tdap) vaccine. An adult who has not previously received Tdap or who does not know his vaccine status should receive 1 dose of Tdap. This initial dose should be followed by tetanus and diphtheria toxoids (Td) booster doses every 10 years. Adults with an unknown or incomplete history of completing a 3-dose immunization series with Td-containing vaccines should begin or complete a primary immunization series including a Tdap dose. Adults should receive a Td booster every 10 years.  Zoster vaccine. One dose is recommended for adults aged 60 years or older unless certain conditions are present.    PREVNAR - Pneumococcal 13-valent conjugate (PCV13) vaccine. When indicated, a person who is   uncertain of his immunization history and has no record of immunization should receive the PCV13 vaccine. An adult aged 19 years or older who has certain medical conditions and has not been previously immunized should receive 1 dose of PCV13 vaccine. This PCV13 should be followed with a dose of pneumococcal polysaccharide (PPSV23) vaccine. The PPSV23 vaccine dose should be obtained at least 8 weeks after the dose of PCV13 vaccine. An adult aged 19 years or older who has certain medical conditions and previously received 1 or more doses of PPSV23 vaccine should receive 1 dose of PCV13. The PCV13 vaccine dose should be obtained 1 or more years after the last PPSV23 vaccine dose.    PNEUMOVAX - Pneumococcal polysaccharide (PPSV23) vaccine. When PCV13 is also indicated, PCV13 should be obtained first. All adults aged 65 years and older should be immunized. An adult younger than age 65 years who has certain medical conditions should be immunized. Any person who resides in a nursing home or long-term care facility should be immunized. An adult smoker should be immunized. People with  an immunocompromised condition and certain other conditions should receive both PCV13 and PPSV23 vaccines. People with human immunodeficiency virus (HIV) infection should be immunized as soon as possible after diagnosis. Immunization during chemotherapy or radiation therapy should be avoided. Routine use of PPSV23 vaccine is not recommended for American Indians, [place] Natives, or people younger than 65 years unless there are medical conditions that require PPSV23 vaccine. When indicated, people who have unknown immunization and have no record of immunization should receive PPSV23 vaccine. One-time revaccination 5 years after the first dose of PPSV23 is recommended for people aged 19-64 years who have chronic kidney failure, nephrotic syndrome, asplenia, or immunocompromised conditions. People who received 1-2 doses of PPSV23 before age 65 years should receive another dose of PPSV23 vaccine at age 65 years or later if at least 5 years have passed since the previous dose. Doses of PPSV23 are not needed for people immunized with PPSV23 at or after age 65 years.    Hepatitis A vaccine. Adults who wish to be protected from this disease, have certain high-risk conditions, work with hepatitis A-infected animals, work in hepatitis A research labs, or travel to or work in countries with a high rate of hepatitis A should be immunized. Adults who were previously unvaccinated and who anticipate close contact with an international adoptee during the first 60 days after arrival in the [place] from a country with a high rate of hepatitis A should be immunized.    Hepatitis B vaccine. Adults should be immunized if they wish to be protected from this disease, have certain high-risk conditions, may be exposed to blood or other infectious body fluids, are household contacts or sex partners of hepatitis B positive people, are clients or workers in certain care facilities, or travel to or work in countries with a high  rate of hepatitis B.   Preventive Service / Frequency   Ages 65 and over  Blood pressure check.  Lipid and cholesterol check.  Lung cancer screening. / Every year if you are aged 55-80 years and have a 30-pack-year history of smoking and currently smoke or have quit within the past 15 years. Yearly screening is stopped once you have quit smoking for at least 15 years or develop a health problem that would prevent you from having lung cancer treatment.  Fecal occult blood test (FOBT) of stool. You may not have to do this test if you get a   colonoscopy every 10 years.  Flexible sigmoidoscopy** or colonoscopy.** / Every 5 years for a flexible sigmoidoscopy or every 10 years for a colonoscopy beginning at age 50 and continuing until age 75.  Hepatitis C blood test.** / For all people born from 1945 through 1965 and any individual with known risks for hepatitis C.  Abdominal aortic aneurysm (AAA) screening./ Screening current or former smokers or have Hypertension.  Skin self-exam. / Monthly.  Influenza vaccine. / Every year.  Tetanus, diphtheria, and acellular pertussis (Tdap/Td) vaccine.** / 1 dose of Td every 10 years.   Zoster vaccine.** / 1 dose for adults aged 60 years or older.         Pneumococcal 13-valent conjugate (PCV13) vaccine.    Pneumococcal polysaccharide (PPSV23) vaccine.     Hepatitis A vaccine.** / Consult your health care provider.  Hepatitis B vaccine.** / Consult your health care provider. Screening for abdominal aortic aneurysm (AAA)  by ultrasound is recommended for people who have history of high blood pressure or who are current or former smokers. 

## 2014-10-14 LAB — MICROALBUMIN / CREATININE URINE RATIO
CREATININE, URINE: 118.5 mg/dL
Microalb Creat Ratio: 266.7 mg/g — ABNORMAL HIGH (ref 0.0–30.0)
Microalb, Ur: 31.6 mg/dL — ABNORMAL HIGH (ref <2.0)

## 2014-10-14 LAB — URINALYSIS, MICROSCOPIC ONLY
BACTERIA UA: NONE SEEN
CASTS: NONE SEEN
Crystals: NONE SEEN
Squamous Epithelial / LPF: NONE SEEN

## 2014-10-14 LAB — VITAMIN D 25 HYDROXY (VIT D DEFICIENCY, FRACTURES): Vit D, 25-Hydroxy: 64 ng/mL (ref 30–100)

## 2014-10-14 LAB — PSA: PSA: 3.61 ng/mL (ref <=4.00)

## 2014-10-14 LAB — INSULIN, RANDOM: Insulin: 6.5 u[IU]/mL (ref 2.0–19.6)

## 2014-10-27 ENCOUNTER — Other Ambulatory Visit: Payer: Self-pay | Admitting: Physician Assistant

## 2014-10-27 ENCOUNTER — Other Ambulatory Visit: Payer: Self-pay | Admitting: Internal Medicine

## 2014-11-23 ENCOUNTER — Other Ambulatory Visit: Payer: Self-pay

## 2015-01-20 ENCOUNTER — Encounter: Payer: Self-pay | Admitting: Internal Medicine

## 2015-01-20 ENCOUNTER — Ambulatory Visit (INDEPENDENT_AMBULATORY_CARE_PROVIDER_SITE_OTHER): Payer: Medicare Other | Admitting: Internal Medicine

## 2015-01-20 VITALS — BP 136/70 | HR 74 | Temp 98.2°F | Resp 18 | Ht 66.75 in | Wt 193.0 lb

## 2015-01-20 DIAGNOSIS — Z79899 Other long term (current) drug therapy: Secondary | ICD-10-CM

## 2015-01-20 DIAGNOSIS — E559 Vitamin D deficiency, unspecified: Secondary | ICD-10-CM

## 2015-01-20 DIAGNOSIS — R7309 Other abnormal glucose: Secondary | ICD-10-CM | POA: Diagnosis not present

## 2015-01-20 DIAGNOSIS — E785 Hyperlipidemia, unspecified: Secondary | ICD-10-CM | POA: Diagnosis not present

## 2015-01-20 DIAGNOSIS — R7303 Prediabetes: Secondary | ICD-10-CM

## 2015-01-20 DIAGNOSIS — I10 Essential (primary) hypertension: Secondary | ICD-10-CM

## 2015-01-20 DIAGNOSIS — E039 Hypothyroidism, unspecified: Secondary | ICD-10-CM | POA: Diagnosis not present

## 2015-01-20 LAB — CBC WITH DIFFERENTIAL/PLATELET
BASOS ABS: 0.1 10*3/uL (ref 0.0–0.1)
Basophils Relative: 1 % (ref 0–1)
EOS PCT: 2 % (ref 0–5)
Eosinophils Absolute: 0.1 10*3/uL (ref 0.0–0.7)
HEMATOCRIT: 47.6 % (ref 39.0–52.0)
Hemoglobin: 16.6 g/dL (ref 13.0–17.0)
LYMPHS PCT: 18 % (ref 12–46)
Lymphs Abs: 1.3 10*3/uL (ref 0.7–4.0)
MCH: 31.2 pg (ref 26.0–34.0)
MCHC: 34.9 g/dL (ref 30.0–36.0)
MCV: 89.5 fL (ref 78.0–100.0)
MPV: 9.8 fL (ref 8.6–12.4)
Monocytes Absolute: 0.4 10*3/uL (ref 0.1–1.0)
Monocytes Relative: 5 % (ref 3–12)
NEUTROS PCT: 74 % (ref 43–77)
Neutro Abs: 5.5 10*3/uL (ref 1.7–7.7)
Platelets: 206 10*3/uL (ref 150–400)
RBC: 5.32 MIL/uL (ref 4.22–5.81)
RDW: 13.8 % (ref 11.5–15.5)
WBC: 7.4 10*3/uL (ref 4.0–10.5)

## 2015-01-20 LAB — HEPATIC FUNCTION PANEL
ALK PHOS: 73 U/L (ref 40–115)
ALT: 24 U/L (ref 9–46)
AST: 25 U/L (ref 10–35)
Albumin: 4.3 g/dL (ref 3.6–5.1)
BILIRUBIN INDIRECT: 0.8 mg/dL (ref 0.2–1.2)
BILIRUBIN TOTAL: 1 mg/dL (ref 0.2–1.2)
Bilirubin, Direct: 0.2 mg/dL (ref <=0.2)
Total Protein: 6.9 g/dL (ref 6.1–8.1)

## 2015-01-20 LAB — BASIC METABOLIC PANEL WITH GFR
BUN: 10 mg/dL (ref 7–25)
CO2: 30 mmol/L (ref 20–31)
Calcium: 9.5 mg/dL (ref 8.6–10.3)
Chloride: 101 mmol/L (ref 98–110)
Creat: 0.72 mg/dL (ref 0.70–1.18)
Glucose, Bld: 92 mg/dL (ref 65–99)
POTASSIUM: 4.1 mmol/L (ref 3.5–5.3)
SODIUM: 140 mmol/L (ref 135–146)

## 2015-01-20 LAB — LIPID PANEL
Cholesterol: 213 mg/dL — ABNORMAL HIGH (ref 125–200)
HDL: 50 mg/dL (ref >=40)
LDL CALC: 141 mg/dL — AB (ref <130)
TRIGLYCERIDES: 111 mg/dL (ref <150)
Total CHOL/HDL Ratio: 4.3 Ratio (ref <=5.0)
VLDL: 22 mg/dL (ref <30)

## 2015-01-20 LAB — TSH: TSH: 11.463 u[IU]/mL — ABNORMAL HIGH (ref 0.350–4.500)

## 2015-01-20 LAB — HEMOGLOBIN A1C
Hgb A1c MFr Bld: 4.8 % (ref <5.7)
Mean Plasma Glucose: 91 mg/dL (ref <117)

## 2015-01-20 LAB — MAGNESIUM: Magnesium: 1.8 mg/dL (ref 1.5–2.5)

## 2015-01-20 MED ORDER — PREDNISONE 20 MG PO TABS: ORAL_TABLET | ORAL | Status: DC

## 2015-01-20 NOTE — Progress Notes (Signed)
Patient ID: Andrew Cochran, male   DOB: Dec 29, 1940, 74 y.o.   MRN: [id number]  Assessment and Plan:  Hypertension:  -Continue medication,  -monitor blood pressure at home.  -Continue DASH diet.   -Reminder to go to the ER if any CP, SOB, nausea, dizziness, severe HA, changes vision/speech, left arm numbness and tingling, and jaw pain.  Cholesterol: -Continue diet and exercise.  -Check cholesterol.   Pre-diabetes: -Continue diet and exercise.  -Check A1C  Vitamin D Def: -check level -continue medications.   Gout -prednisone refilled for prn flare ups  Continue diet and meds as discussed. Further disposition pending results of labs.  HPI 74 y.o. male  presents for 3 month follow up with hypertension, hyperlipidemia, prediabetes and vitamin D.   His blood pressure has been controlled at home, today their BP is BP: 136/70 mmHg.   He does not workout. He denies chest pain, shortness of breath, dizziness.  He reports that he has not been getting a whole lot of exercise in.     He is on cholesterol medication and denies myalgias. His cholesterol is not at goal. The cholesterol last visit was:   Lab Results  Component Value Date   CHOL 198 10/13/2014   HDL 52 10/13/2014   LDLCALC 125* 10/13/2014   TRIG 105 10/13/2014   CHOLHDL 3.8 10/13/2014     He has been working on diet and exercise for prediabetes, and denies foot ulcerations, hyperglycemia, hypoglycemia , increased appetite, nausea, paresthesia of the feet, polydipsia, polyuria, visual disturbances, vomiting and weight loss. Last A1C in the office was:  Lab Results  Component Value Date   HGBA1C 4.8 10/13/2014  He has been eating very healthy foods and has been very aware of what he has been eating.  He has cut out all sweets.    Patient is on Vitamin D supplement.  Lab Results  Component Value Date   VD25OH 64 10/13/2014      Current Medications:  Current Outpatient Prescriptions on File Prior to Visit  Medication  Sig Dispense Refill  . allopurinol (ZYLOPRIM) 300 MG tablet TAKE ONE TABLET BY MOUTH ONCE DAILY TO  PREVENT  GOUT 90 tablet 0  . Ascorbic Acid (VITAMIN C PO) Take 2,000 mg by mouth daily.     Marland Kitchen atenolol (TENORMIN) 100 MG tablet TAKE ONE TABLET BY MOUTH ONCE DAILY 90 tablet 0  . BABY ASPIRIN PO Take 81 mg by mouth daily.    . Blood Glucose Calibration (ACCU-CHEK AVIVA) SOLN See admin instructions.  99  . Blood Glucose Monitoring Suppl (ACCU-CHEK AVIVA PLUS) W/DEVICE KIT See admin instructions.  0  . Cholecalciferol (VITAMIN D PO) Take 10,000 Int'l Units by mouth daily.    Marland Kitchen EASY COMFORT LANCETS MISC   99  . Lancet Devices (EASY MINI LANCING DEVICE) MISC See admin instructions.  99  . levothyroxine (SYNTHROID, LEVOTHROID) 200 MCG tablet TAKE ONE AND ONE-HALF TABLETS BY MOUTH ONCE DAILY 135 tablet 1  . MAGNESIUM PO Take 250 mg by mouth 3 (three) times daily.     . Omega-3 Fatty Acids (FISH OIL PO) Take 1,000 mg by mouth daily.     . ONE TOUCH ULTRA TEST test strip use once daily as directed to test blood sugar 100 each 99  . predniSONE (DELTASONE) 20 MG tablet TAKE ONE TABLET BY MOUTH THREE TIMES DAILY AS  DIRECTED 30 tablet 0  . Red Yeast Rice Extract (RED YEAST RICE PO) Take 1,000 mg by mouth daily.  No current facility-administered medications on file prior to visit.    Medical History:  Past Medical History  Diagnosis Date  . Gout   . Cataracts, bilateral   . Hypertension   . Thyroid disease   . BPH with elevated PSA   . Prediabetes   . Hyperlipidemia   . Diverticulosis   . Tubular adenoma 02/17/2003  . Hx of adenomatous colonic polyps 06/18/2014    Allergies:  Allergies  Allergen Reactions  . Lotensin [Benazepril Hcl]     unknown  . Penicillins Rash     Review of Systems:  Review of Systems  Constitutional: Negative for fever, chills and malaise/fatigue.  HENT: Negative for congestion, ear pain and sore throat.   Eyes: Negative.   Respiratory: Negative for cough,  shortness of breath and wheezing.   Cardiovascular: Negative for chest pain, palpitations and leg swelling.  Gastrointestinal: Negative for heartburn, diarrhea, constipation, blood in stool and melena.  Genitourinary: Negative.   Skin:       Spot on his back.  Neurological: Negative for dizziness, sensory change, loss of consciousness and headaches.  Psychiatric/Behavioral: Negative for depression. The patient is not nervous/anxious and does not have insomnia.     Family history- Review and unchanged  Social history- Review and unchanged  Physical Exam: BP 136/70 mmHg  Pulse 74  Temp(Src) 98.2 F (36.8 C) (Temporal)  Resp 18  Ht 5' 6.75" (1.695 m)  Wt 193 lb (87.544 kg)  BMI 30.47 kg/m2 Wt Readings from Last 3 Encounters:  01/20/15 193 lb (87.544 kg)  10/13/14 198 lb 5.4 oz (89.966 kg)  07/07/14 211 lb (95.709 kg)    General Appearance: Well nourished well developed, in no apparent distress. Eyes: PERRLA, EOMs, conjunctiva no swelling or erythema ENT/Mouth: Ear canals normal without obstruction, swelling, erythma, discharge.  TMs normal bilaterally.  Oropharynx moist, clear, without exudate, or postoropharyngeal swelling. Neck: Supple, thyroid normal,no cervical adenopathy  Respiratory: Respiratory effort normal, Breath sounds clear A&P without rhonchi, wheeze, or rale.  No retractions, no accessory usage. Cardio: RRR with no MRGs. Brisk peripheral pulses without edema.  Abdomen: Soft, + BS,  Non tender, no guarding, rebound, hernias, masses. Musculoskeletal: Full ROM, 5/5 strength, Normal gait Skin: Warm, dry without rashes, lesions, ecchymosis.  Neuro: Awake and oriented X 3, Cranial nerves intact. Normal muscle tone, no cerebellar symptoms. Psych: Normal affect, Insight and Judgment appropriate.    Starlyn Skeans, PA-C 10:38 AM [facility]

## 2015-01-21 LAB — VITAMIN D 25 HYDROXY (VIT D DEFICIENCY, FRACTURES): VIT D 25 HYDROXY: 64 ng/mL (ref 30–100)

## 2015-01-21 LAB — INSULIN, RANDOM: Insulin: 9.5 u[IU]/mL (ref 2.0–19.6)

## 2015-02-23 ENCOUNTER — Other Ambulatory Visit: Payer: Self-pay | Admitting: Internal Medicine

## 2015-04-30 ENCOUNTER — Encounter: Payer: Self-pay | Admitting: Internal Medicine

## 2015-04-30 ENCOUNTER — Ambulatory Visit (INDEPENDENT_AMBULATORY_CARE_PROVIDER_SITE_OTHER): Payer: Medicare Other | Admitting: Internal Medicine

## 2015-04-30 VITALS — BP 118/62 | HR 60 | Temp 97.7°F | Resp 16 | Ht 66.75 in | Wt 194.8 lb

## 2015-04-30 DIAGNOSIS — I10 Essential (primary) hypertension: Secondary | ICD-10-CM

## 2015-04-30 DIAGNOSIS — Z6833 Body mass index (BMI) 33.0-33.9, adult: Secondary | ICD-10-CM

## 2015-04-30 DIAGNOSIS — Z6830 Body mass index (BMI) 30.0-30.9, adult: Secondary | ICD-10-CM | POA: Diagnosis not present

## 2015-04-30 DIAGNOSIS — E559 Vitamin D deficiency, unspecified: Secondary | ICD-10-CM | POA: Diagnosis not present

## 2015-04-30 DIAGNOSIS — M1000 Idiopathic gout, unspecified site: Secondary | ICD-10-CM

## 2015-04-30 DIAGNOSIS — E039 Hypothyroidism, unspecified: Secondary | ICD-10-CM

## 2015-04-30 DIAGNOSIS — M109 Gout, unspecified: Secondary | ICD-10-CM | POA: Diagnosis not present

## 2015-04-30 DIAGNOSIS — R7303 Prediabetes: Secondary | ICD-10-CM | POA: Diagnosis not present

## 2015-04-30 DIAGNOSIS — E785 Hyperlipidemia, unspecified: Secondary | ICD-10-CM | POA: Diagnosis not present

## 2015-04-30 DIAGNOSIS — R7309 Other abnormal glucose: Secondary | ICD-10-CM

## 2015-04-30 DIAGNOSIS — Z79899 Other long term (current) drug therapy: Secondary | ICD-10-CM

## 2015-04-30 LAB — HEMOGLOBIN A1C
HEMOGLOBIN A1C: 4.9 % (ref <5.7)
MEAN PLASMA GLUCOSE: 94 mg/dL (ref <117)

## 2015-04-30 LAB — CBC WITH DIFFERENTIAL/PLATELET
Basophils Absolute: 0 10*3/uL (ref 0.0–0.1)
Basophils Relative: 0 % (ref 0–1)
EOS ABS: 0.2 10*3/uL (ref 0.0–0.7)
EOS PCT: 2 % (ref 0–5)
HCT: 43.9 % (ref 39.0–52.0)
Hemoglobin: 16 g/dL (ref 13.0–17.0)
LYMPHS ABS: 1.4 10*3/uL (ref 0.7–4.0)
Lymphocytes Relative: 19 % (ref 12–46)
MCH: 31.7 pg (ref 26.0–34.0)
MCHC: 36.4 g/dL — ABNORMAL HIGH (ref 30.0–36.0)
MCV: 87.1 fL (ref 78.0–100.0)
MONO ABS: 0.5 10*3/uL (ref 0.1–1.0)
MONOS PCT: 7 % (ref 3–12)
MPV: 9.4 fL (ref 8.6–12.4)
Neutro Abs: 5.4 10*3/uL (ref 1.7–7.7)
Neutrophils Relative %: 72 % (ref 43–77)
PLATELETS: 226 10*3/uL (ref 150–400)
RBC: 5.04 MIL/uL (ref 4.22–5.81)
RDW: 13.6 % (ref 11.5–15.5)
WBC: 7.5 10*3/uL (ref 4.0–10.5)

## 2015-04-30 LAB — BASIC METABOLIC PANEL WITH GFR
BUN: 14 mg/dL (ref 7–25)
CHLORIDE: 102 mmol/L (ref 98–110)
CO2: 31 mmol/L (ref 20–31)
CREATININE: 0.79 mg/dL (ref 0.70–1.18)
Calcium: 9.5 mg/dL (ref 8.6–10.3)
GFR, Est Non African American: >89 mL/min (ref >=60)
Glucose, Bld: 88 mg/dL (ref 65–99)
Potassium: 4 mmol/L (ref 3.5–5.3)
Sodium: 141 mmol/L (ref 135–146)

## 2015-04-30 LAB — HEPATIC FUNCTION PANEL
ALBUMIN: 4.3 g/dL (ref 3.6–5.1)
ALT: 21 U/L (ref 9–46)
AST: 24 U/L (ref 10–35)
Alkaline Phosphatase: 72 U/L (ref 40–115)
BILIRUBIN TOTAL: 1.3 mg/dL — AB (ref 0.2–1.2)
Bilirubin, Direct: 0.2 mg/dL (ref <=0.2)
Indirect Bilirubin: 1.1 mg/dL (ref 0.2–1.2)
Total Protein: 7 g/dL (ref 6.1–8.1)

## 2015-04-30 LAB — LIPID PANEL
Cholesterol: 210 mg/dL — ABNORMAL HIGH (ref 125–200)
HDL: 60 mg/dL (ref >=40)
LDL Cholesterol: 138 mg/dL — ABNORMAL HIGH (ref <130)
Total CHOL/HDL Ratio: 3.5 Ratio (ref <=5.0)
Triglycerides: 62 mg/dL (ref <150)
VLDL: 12 mg/dL (ref <30)

## 2015-04-30 LAB — URIC ACID: URIC ACID, SERUM: 5.9 mg/dL (ref 4.0–7.8)

## 2015-04-30 LAB — MAGNESIUM: MAGNESIUM: 2 mg/dL (ref 1.5–2.5)

## 2015-04-30 LAB — TSH: TSH: 10.739 u[IU]/mL — AB (ref 0.350–4.500)

## 2015-04-30 NOTE — Patient Instructions (Signed)

## 2015-05-01 LAB — INSULIN, RANDOM: INSULIN: 7.9 u[IU]/mL (ref 2.0–19.6)

## 2015-05-01 LAB — VITAMIN D 25 HYDROXY (VIT D DEFICIENCY, FRACTURES): VIT D 25 HYDROXY: 58 ng/mL (ref 30–100)

## 2015-05-01 NOTE — Progress Notes (Signed)
Patient ID: Andrew Cochran, male   DOB: 31-Oct-1940, 74 y.o.   MRN: [id number]   This very nice 74 y.o. MWM presents for 3 month follow up with Hypertension, Hyperlipidemia, Pre-Diabetes and Vitamin D Deficiency.    Patient is treated for HTN & BP has been controlled at home. Today's BP: 118/62 mmHg. Patient has had no complaints of any cardiac type chest pain, palpitations, dyspnea/orthopnea/PND, dizziness, claudication, or dependent edema.   Hyperlipidemia is not controlled with diet & meds. Patient denies myalgias or other med SE's. Last Lipids were not at goal with Cholesterol 210; HDL 60; LDL 138; Triglycerides 62 on 04/30/2015.   Also, the patient has history of PreDiabetes since 2011 with a1c 6.4% and has had no symptoms of reactive hypoglycemia, diabetic polys, paresthesias or visual blurring.  Patient has lost 34 # over the last 4 years and down from 261 to his current 195 ! Last A1c was 4.9% on  04/30/2015.   Further, the patient also has history of Vitamin D Deficiency of 36 in 2008  and supplements vitamin D without any suspected side-effects. Last vitamin D was  58 on 04/30/2015.  Medication Sig  . allopurinol 300 MG tablet TAKE ONE TABLET BY MOUTH ONCE DAILY TO  PREVENT  GOUT  . VITAMIN C  Take 2,000 mg by mouth daily.   Marland Kitchen atenolol  100 MG tablet TAKE ONE TABLET BY MOUTH ONCE DAILY  . BABY ASPIRIN  Take 81 mg by mouth daily.  Marland Kitchen VITAMIN D  Take 10,000 Int'l Units by mouth daily.  Marland Kitchen levothyroxine 200 MCG tablet TAKE ONE AND ONE-HALF TABLETS BY MOUTH ONCE DAILY  . MAGNESIUM PO Take 250 mg by mouth 3 (three) times daily.   . Omega-3 FISH OIL Take 1,000 mg by mouth daily.   . predniSONE 20 MG tablet TAKE ONE TABLET BY MOUTH THREE TIMES DAILY AS DIRECTED  . Red Yeast Rice Extract  Take 1,000 mg by mouth daily.    Allergies  Allergen Reactions  . Lotensin [Benazepril Hcl]     unknown  . Penicillins Rash   PMHx:   Past Medical History  Diagnosis Date  . Gout   . Cataracts, bilateral    . Hypertension   . Thyroid disease   . BPH with elevated PSA   . Prediabetes   . Hyperlipidemia   . Diverticulosis   . Tubular adenoma 02/17/2003  . Hx of adenomatous colonic polyps 06/18/2014   Immunization History  Administered Date(s) Administered  . Pneumococcal Polysaccharide-23 08/19/2008  . Td 09/07/2009   Past Surgical History  Procedure Laterality Date  . Cataract extraction Right 2009  . Shoulder arthroscopy Right     1979  . Colonoscopy w/ biopsies     FHx:    Reviewed / unchanged  SHx:    Reviewed / unchanged  Systems Review:  Constitutional: Denies fever, chills, wt changes, headaches, insomnia, fatigue, night sweats, change in appetite. Eyes: Denies redness, blurred vision, diplopia, discharge, itchy, watery eyes.  ENT: Denies discharge, congestion, post nasal drip, epistaxis, sore throat, earache, hearing loss, dental pain, tinnitus, vertigo, sinus pain, snoring.  CV: Denies chest pain, palpitations, irregular heartbeat, syncope, dyspnea, diaphoresis, orthopnea, PND, claudication or edema. Respiratory: denies cough, dyspnea, DOE, pleurisy, hoarseness, laryngitis, wheezing.  Gastrointestinal: Denies dysphagia, odynophagia, heartburn, reflux, water brash, abdominal pain or cramps, nausea, vomiting, bloating, diarrhea, constipation, hematemesis, melena, hematochezia  or hemorrhoids. Genitourinary: Denies dysuria, frequency, urgency, nocturia, hesitancy, discharge, hematuria or flank pain. Musculoskeletal: Denies arthralgias, myalgias,  stiffness, jt. swelling, pain, limping or strain/sprain.  Skin: Denies pruritus, rash, hives, warts, acne, eczema or change in skin lesion(s). Neuro: No weakness, tremor, incoordination, spasms, paresthesia or pain. Psychiatric: Denies confusion, memory loss or sensory loss. Endo: Denies change in weight, skin or hair change.  Heme/Lymph: No excessive bleeding, bruising or enlarged lymph nodes.  Physical Exam  BP 118/62 mmHg   Pulse 60  Temp(Src) 97.7 F (36.5 C)  Resp 16  Ht 5' 6.75" (1.695 m)  Wt 194 lb 12.8 oz (88.361 kg)  BMI 30.76 kg/m2  Appears over  nourished and in no distress. Eyes: PERRLA, EOMs, conjunctiva no swelling or erythema. Sinuses: No frontal/maxillary tenderness ENT/Mouth: EAC's clear, TM's nl w/o erythema, bulging. Nares clear w/o erythema, swelling, exudates. Oropharynx clear without erythema or exudates. Oral hygiene is good. Tongue normal, non obstructing. Hearing intact.  Neck: Supple. Thyroid nl. Car 2+/2+ without bruits, nodes or JVD. Chest: Respirations nl with BS clear & equal w/o rales, rhonchi, wheezing or stridor.  Cor: Heart sounds normal w/ regular rate and rhythm without sig. murmurs, gallops, clicks, or rubs. Peripheral pulses normal and equal  without edema.  Abdomen: Soft & bowel sounds normal. Non-tender w/o guarding, rebound, hernias, masses, or organomegaly.  Lymphatics: Unremarkable.  Musculoskeletal: Full ROM all peripheral extremities, joint stability, 5/5 strength, and normal gait.  Skin: Warm, dry without exposed rashes, lesions or ecchymosis apparent.  Neuro: Cranial nerves intact, reflexes equal bilaterally. Sensory-motor testing grossly intact. Tendon reflexes grossly intact.  Pysch: Alert & oriented x 3.  Insight and judgement nl & appropriate. No ideations.  Assessment and Plan:  1. Essential hypertension  - TSH  2. Hyperlipidemia  - Lipid panel - TSH  3. Prediabetes  - Hemoglobin A1c - Insulin, random  4. Idiopathic gout  - Uric acid - VITAMIN D 25 Hydroxy   5. Vitamin D deficiency   6. Hypothyroidism   7. Morbid obesity ([place])   8. Other abnormal glucose  - Hemoglobin A1c - Insulin, random  9. BMI 30.0-30.9,adult   10. Medication management  - CBC with Differential/Platelet - BASIC METABOLIC PANEL WITH GFR - Hepatic function panel - Magnesium   Recommended regular exercise, BP monitoring, weight control, and discussed med  and SE's. Recommended labs to assess and monitor clinical status. Further disposition pending results of labs. Over 30 minutes of exam, counseling, chart review was performed

## 2015-05-10 ENCOUNTER — Telehealth: Payer: Self-pay | Admitting: *Deleted

## 2015-05-10 NOTE — Telephone Encounter (Signed)
Patient aware of labs and instructed to take 200 mcg tab, 1 whole tablet 3 days a week and 1/2 tab 4 days a week.  Patient scheduled a NV for TSH in 1 month.

## 2015-05-13 ENCOUNTER — Other Ambulatory Visit: Payer: Self-pay | Admitting: *Deleted

## 2015-05-13 MED ORDER — GLUCOSE BLOOD VI STRP: ORAL_STRIP | Status: DC

## 2015-06-10 ENCOUNTER — Ambulatory Visit (INDEPENDENT_AMBULATORY_CARE_PROVIDER_SITE_OTHER): Payer: Medicare Other | Admitting: *Deleted

## 2015-06-10 DIAGNOSIS — E039 Hypothyroidism, unspecified: Secondary | ICD-10-CM | POA: Diagnosis not present

## 2015-06-10 LAB — TSH: TSH: 1.237 u[IU]/mL (ref 0.350–4.500)

## 2015-06-10 NOTE — Progress Notes (Signed)
Patient ID: [name], male   DOB: 04-Jul-1940, 75 y.o.   MRN: [id number] Patient was advised last month to take Levothyroxine 200 mcg 1 pill times 3 days and 1/2 pill times 4 days.  Patient states he must have misunderstood instructions because he is currently taking 1 pill QD.

## 2015-06-17 ENCOUNTER — Other Ambulatory Visit: Payer: Self-pay | Admitting: Internal Medicine

## 2015-06-17 MED ORDER — PREDNISONE 20 MG PO TABS: ORAL_TABLET | ORAL | Status: DC

## 2015-08-02 ENCOUNTER — Ambulatory Visit (INDEPENDENT_AMBULATORY_CARE_PROVIDER_SITE_OTHER): Payer: Medicare Other | Admitting: Physician Assistant

## 2015-08-02 ENCOUNTER — Encounter: Payer: Self-pay | Admitting: Physician Assistant

## 2015-08-02 VITALS — BP 140/80 | HR 89 | Temp 97.5°F | Resp 16 | Ht 66.75 in | Wt 202.2 lb

## 2015-08-02 DIAGNOSIS — Z0001 Encounter for general adult medical examination with abnormal findings: Secondary | ICD-10-CM

## 2015-08-02 DIAGNOSIS — R7309 Other abnormal glucose: Secondary | ICD-10-CM | POA: Diagnosis not present

## 2015-08-02 DIAGNOSIS — M1000 Idiopathic gout, unspecified site: Secondary | ICD-10-CM

## 2015-08-02 DIAGNOSIS — E785 Hyperlipidemia, unspecified: Secondary | ICD-10-CM | POA: Diagnosis not present

## 2015-08-02 DIAGNOSIS — Z860101 Personal history of adenomatous and serrated colon polyps: Secondary | ICD-10-CM

## 2015-08-02 DIAGNOSIS — R7303 Prediabetes: Secondary | ICD-10-CM | POA: Diagnosis not present

## 2015-08-02 DIAGNOSIS — R972 Elevated prostate specific antigen [PSA]: Secondary | ICD-10-CM | POA: Diagnosis not present

## 2015-08-02 DIAGNOSIS — E559 Vitamin D deficiency, unspecified: Secondary | ICD-10-CM | POA: Diagnosis not present

## 2015-08-02 DIAGNOSIS — Z86010 Personal history of colonic polyps: Secondary | ICD-10-CM | POA: Diagnosis not present

## 2015-08-02 DIAGNOSIS — M353 Polymyalgia rheumatica: Secondary | ICD-10-CM

## 2015-08-02 DIAGNOSIS — E039 Hypothyroidism, unspecified: Secondary | ICD-10-CM

## 2015-08-02 DIAGNOSIS — R6889 Other general symptoms and signs: Secondary | ICD-10-CM

## 2015-08-02 DIAGNOSIS — Z0000 Encounter for general adult medical examination without abnormal findings: Secondary | ICD-10-CM

## 2015-08-02 DIAGNOSIS — N400 Enlarged prostate without lower urinary tract symptoms: Secondary | ICD-10-CM

## 2015-08-02 DIAGNOSIS — I10 Essential (primary) hypertension: Secondary | ICD-10-CM

## 2015-08-02 DIAGNOSIS — Z79899 Other long term (current) drug therapy: Secondary | ICD-10-CM | POA: Diagnosis not present

## 2015-08-02 DIAGNOSIS — M109 Gout, unspecified: Secondary | ICD-10-CM | POA: Diagnosis not present

## 2015-08-02 LAB — CBC WITH DIFFERENTIAL/PLATELET
BASOS ABS: 0 10*3/uL (ref 0.0–0.1)
Basophils Relative: 0 % (ref 0–1)
EOS ABS: 0.1 10*3/uL (ref 0.0–0.7)
EOS PCT: 2 % (ref 0–5)
HEMATOCRIT: 47.1 % (ref 39.0–52.0)
Hemoglobin: 16.8 g/dL (ref 13.0–17.0)
Lymphocytes Relative: 20 % (ref 12–46)
Lymphs Abs: 1.4 10*3/uL (ref 0.7–4.0)
MCH: 31 pg (ref 26.0–34.0)
MCHC: 35.7 g/dL (ref 30.0–36.0)
MCV: 86.9 fL (ref 78.0–100.0)
MONO ABS: 0.5 10*3/uL (ref 0.1–1.0)
MPV: 9.6 fL (ref 8.6–12.4)
Monocytes Relative: 7 % (ref 3–12)
Neutro Abs: 4.8 10*3/uL (ref 1.7–7.7)
Neutrophils Relative %: 71 % (ref 43–77)
PLATELETS: 246 10*3/uL (ref 150–400)
RBC: 5.42 MIL/uL (ref 4.22–5.81)
RDW: 13.4 % (ref 11.5–15.5)
WBC: 6.8 10*3/uL (ref 4.0–10.5)

## 2015-08-02 LAB — LIPID PANEL
CHOL/HDL RATIO: 4.1 ratio (ref <=5.0)
Cholesterol: 229 mg/dL — ABNORMAL HIGH (ref 125–200)
HDL: 56 mg/dL (ref >=40)
LDL Cholesterol: 154 mg/dL — ABNORMAL HIGH (ref <130)
TRIGLYCERIDES: 95 mg/dL (ref <150)
VLDL: 19 mg/dL (ref <30)

## 2015-08-02 LAB — BASIC METABOLIC PANEL WITH GFR
BUN: 13 mg/dL (ref 7–25)
CO2: 28 mmol/L (ref 20–31)
Calcium: 9.5 mg/dL (ref 8.6–10.3)
Chloride: 102 mmol/L (ref 98–110)
Creat: 0.83 mg/dL (ref 0.70–1.18)
GFR, EST NON AFRICAN AMERICAN: 88 mL/min (ref >=60)
GLUCOSE: 101 mg/dL — AB (ref 65–99)
POTASSIUM: 4.2 mmol/L (ref 3.5–5.3)
SODIUM: 138 mmol/L (ref 135–146)

## 2015-08-02 LAB — HEPATIC FUNCTION PANEL
ALK PHOS: 77 U/L (ref 40–115)
ALT: 24 U/L (ref 9–46)
AST: 27 U/L (ref 10–35)
Albumin: 4.2 g/dL (ref 3.6–5.1)
BILIRUBIN DIRECT: 0.2 mg/dL (ref <=0.2)
BILIRUBIN INDIRECT: 0.9 mg/dL (ref 0.2–1.2)
BILIRUBIN TOTAL: 1.1 mg/dL (ref 0.2–1.2)
Total Protein: 7 g/dL (ref 6.1–8.1)

## 2015-08-02 LAB — MAGNESIUM: Magnesium: 1.9 mg/dL (ref 1.5–2.5)

## 2015-08-02 LAB — HEMOGLOBIN A1C
Hgb A1c MFr Bld: 4.9 % (ref <5.7)
Mean Plasma Glucose: 94 mg/dL (ref <117)

## 2015-08-02 LAB — URIC ACID: Uric Acid, Serum: 9 mg/dL — ABNORMAL HIGH (ref 4.0–7.8)

## 2015-08-02 LAB — TSH: TSH: 5.51 mIU/L — ABNORMAL HIGH (ref 0.40–4.50)

## 2015-08-02 NOTE — Patient Instructions (Signed)
Preventive Care for Adults A healthy lifestyle and preventive care can promote health and wellness. Preventive health guidelines for men include the following key practices:  A routine yearly physical is a good way to check with your health care provider about your health and preventative screening. It is a chance to share any concerns and updates on your health and to receive a thorough exam.  Visit your dentist for a routine exam and preventative care every 6 months. Brush your teeth twice a day and floss once a day. Good oral hygiene prevents tooth decay and gum disease.  The frequency of eye exams is based on your age, health, family medical history, use of contact lenses, and other factors. Follow your health care provider's recommendations for frequency of eye exams.  Eat a healthy diet. Foods such as vegetables, fruits, whole grains, low-fat dairy products, and lean protein foods contain the nutrients you need without too many calories. Decrease your intake of foods high in solid fats, added sugars, and salt. Eat the right amount of calories for you.Get information about a proper diet from your health care provider, if necessary.  Regular physical exercise is one of the most important things you can do for your health. Most adults should get at least 150 minutes of moderate-intensity exercise (any activity that increases your heart rate and causes you to sweat) each week. In addition, most adults need muscle-strengthening exercises on 2 or more days a week.  Maintain a healthy weight. The body mass index (BMI) is a screening tool to identify possible weight problems. It provides an estimate of body fat based on height and weight. Your health care provider can find your BMI and can help you achieve or maintain a healthy weight.For adults 20 years and older:  A BMI below 18.5 is considered underweight.  A BMI of 18.5 to 24.9 is normal.  A BMI of 25 to 29.9 is considered overweight.  A BMI  of 30 and above is considered obese.  Maintain normal blood lipids and cholesterol levels by exercising and minimizing your intake of saturated fat. Eat a balanced diet with plenty of fruit and vegetables. Blood tests for lipids and cholesterol should begin at age 20 and be repeated every 5 years. If your lipid or cholesterol levels are high, you are over 50, or you are at high risk for heart disease, you may need your cholesterol levels checked more frequently.Ongoing high lipid and cholesterol levels should be treated with medicines if diet and exercise are not working.  If you smoke, find out from your health care provider how to quit. If you do not use tobacco, do not start.  Lung cancer screening is recommended for adults aged 55-80 years who are at high risk for developing lung cancer because of a history of smoking. A yearly low-dose CT scan of the lungs is recommended for people who have at least a 30-pack-year history of smoking and are a current smoker or have quit within the past 15 years. A pack year of smoking is smoking an average of 1 pack of cigarettes a day for 1 year (for example: 1 pack a day for 30 years or 2 packs a day for 15 years). Yearly screening should continue until the smoker has stopped smoking for at least 15 years. Yearly screening should be stopped for people who develop a health problem that would prevent them from having lung cancer treatment.  If you choose to drink alcohol, do not have more than   2 drinks per day. One drink is considered to be 12 ounces (355 mL) of beer, 5 ounces (148 mL) of wine, or 1.5 ounces (44 mL) of liquor.  Avoid use of street drugs. Do not share needles with anyone. Ask for help if you need support or instructions about stopping the use of drugs.  High blood pressure causes heart disease and increases the risk of stroke. Your blood pressure should be checked at least every 1-2 years. Ongoing high blood pressure should be treated with  medicines, if weight loss and exercise are not effective.  If you are 45-79 years old, ask your health care provider if you should take aspirin to prevent heart disease.  Diabetes screening involves taking a blood sample to check your fasting blood sugar level. Testing should be considered at a younger age or be carried out more frequently if you are overweight and have at least 1 risk factor for diabetes.  Colorectal cancer can be detected and often prevented. Most routine colorectal cancer screening begins at the age of 50 and continues through age 75. However, your health care provider may recommend screening at an earlier age if you have risk factors for colon cancer. On a yearly basis, your health care provider may provide home test kits to check for hidden blood in the stool. Use of a small camera at the end of a tube to directly examine the colon (sigmoidoscopy or colonoscopy) can detect the earliest forms of colorectal cancer. Talk to your health care provider about this at age 50, when routine screening begins. Direct exam of the colon should be repeated every 5-10 years through age 75, unless early forms of precancerous polyps or small growths are found.  Hepatitis C blood testing is recommended for all people born from 1945 through 1965 and any individual with known risks for hepatitis C.  New guidelines recommend a once time screening for HIV.   Screening for abdominal aortic aneurysm (AAA)  by ultrasound is recommended for people who have history of high blood pressure or who are current or former smokers.  Healthy men should  receive prostate-specific antigen (PSA) blood tests as part of routine cancer screening. Talk with your health care provider about prostate cancer screening.  Testicular cancer screening is  recommended for adult males. Screening includes self-exam, a health care provider exam, and other screening tests. Consult with your health care provider about any symptoms you  have or any concerns you have about testicular cancer.  Use sunscreen. Apply sunscreen liberally and repeatedly throughout the day. You should seek shade when your shadow is shorter than you. Protect yourself by wearing long sleeves, pants, a wide-brimmed hat, and sunglasses year round, whenever you are outdoors.  Once a month, do a whole-body skin exam, using a mirror to look at the skin on your back. Tell your health care provider about new moles, moles that have irregular borders, moles that are larger than a pencil eraser, or moles that have changed in shape or color.  Stay current with required vaccines (immunizations).  Influenza vaccine. All adults should be immunized every year.  Tetanus, diphtheria, and acellular pertussis (Td, Tdap) vaccine. An adult who has not previously received Tdap or who does not know his vaccine status should receive 1 dose of Tdap. This initial dose should be followed by tetanus and diphtheria toxoids (Td) booster doses every 10 years. Adults with an unknown or incomplete history of completing a 3-dose immunization series with Td-containing vaccines should begin or   complete a primary immunization series including a Tdap dose. Adults should receive a Td booster every 10 years.  Zoster vaccine. One dose is recommended for adults aged 60 years or older unless certain conditions are present.    PREVNAR - Pneumococcal 13-valent conjugate (PCV13) vaccine. When indicated, a person who is uncertain of his immunization history and has no record of immunization should receive the PCV13 vaccine. An adult aged 19 years or older who has certain medical conditions and has not been previously immunized should receive 1 dose of PCV13 vaccine. This PCV13 should be followed with a dose of pneumococcal polysaccharide (PPSV23) vaccine. The PPSV23 vaccine dose should be obtained at least 8 weeks after the dose of PCV13 vaccine. An adult aged 19 years or older who has certain medical  conditions and previously received 1 or more doses of PPSV23 vaccine should receive 1 dose of PCV13. The PCV13 vaccine dose should be obtained 1 or more years after the last PPSV23 vaccine dose.    PNEUMOVAX - Pneumococcal polysaccharide (PPSV23) vaccine. When PCV13 is also indicated, PCV13 should be obtained first. All adults aged 65 years and older should be immunized. An adult younger than age 65 years who has certain medical conditions should be immunized. Any person who resides in a nursing home or long-term care facility should be immunized. An adult smoker should be immunized. People with an immunocompromised condition and certain other conditions should receive both PCV13 and PPSV23 vaccines. People with human immunodeficiency virus (HIV) infection should be immunized as soon as possible after diagnosis. Immunization during chemotherapy or radiation therapy should be avoided. Routine use of PPSV23 vaccine is not recommended for American Indians, [place] Natives, or people younger than 65 years unless there are medical conditions that require PPSV23 vaccine. When indicated, people who have unknown immunization and have no record of immunization should receive PPSV23 vaccine. One-time revaccination 5 years after the first dose of PPSV23 is recommended for people aged 19-64 years who have chronic kidney failure, nephrotic syndrome, asplenia, or immunocompromised conditions. People who received 1-2 doses of PPSV23 before age 65 years should receive another dose of PPSV23 vaccine at age 65 years or later if at least 5 years have passed since the previous dose. Doses of PPSV23 are not needed for people immunized with PPSV23 at or after age 65 years.    Hepatitis A vaccine. Adults who wish to be protected from this disease, have certain high-risk conditions, work with hepatitis A-infected animals, work in hepatitis A research labs, or travel to or work in countries with a high rate of hepatitis A should be  immunized. Adults who were previously unvaccinated and who anticipate close contact with an international adoptee during the first 60 days after arrival in the [place] from a country with a high rate of hepatitis A should be immunized.    Hepatitis B vaccine. Adults should be immunized if they wish to be protected from this disease, have certain high-risk conditions, may be exposed to blood or other infectious body fluids, are household contacts or sex partners of hepatitis B positive people, are clients or workers in certain care facilities, or travel to or work in countries with a high rate of hepatitis B.   Preventive Service / Frequency   Ages 65 and over  Blood pressure check.  Lipid and cholesterol check.  Lung cancer screening. / Every year if you are aged 55-80 years and have a 30-pack-year history of smoking and currently smoke or have quit   within the past 15 years. Yearly screening is stopped once you have quit smoking for at least 15 years or develop a health problem that would prevent you from having lung cancer treatment.  Fecal occult blood test (FOBT) of stool. You may not have to do this test if you get a colonoscopy every 10 years.  Flexible sigmoidoscopy** or colonoscopy.** / Every 5 years for a flexible sigmoidoscopy or every 10 years for a colonoscopy beginning at age 50 and continuing until age 75.  Hepatitis C blood test.** / For all people born from 1945 through 1965 and any individual with known risks for hepatitis C.  Abdominal aortic aneurysm (AAA) screening./ Screening current or former smokers or have Hypertension.  Skin self-exam. / Monthly.  Influenza vaccine. / Every year.  Tetanus, diphtheria, and acellular pertussis (Tdap/Td) vaccine.** / 1 dose of Td every 10 years.   Zoster vaccine.** / 1 dose for adults aged 60 years or older.         Pneumococcal 13-valent conjugate (PCV13) vaccine.    Pneumococcal polysaccharide (PPSV23) vaccine.      Hepatitis A vaccine.** / Consult your health care provider.  Hepatitis B vaccine.** / Consult your health care provider. Screening for abdominal aortic aneurysm (AAA)  by ultrasound is recommended for people who have history of high blood pressure or who are current or former smokers.   

## 2015-08-02 NOTE — Progress Notes (Signed)
MEDICARE ANNUAL WELLNESS VISIT AND FOLLOW UP Assessment:   1. Essential hypertension - CBC with Differential - BASIC METABOLIC PANEL WITH GFR - Hepatic function panel  2. Hypothyroidism - TSH  3. BPH with elevated PSA monitor  4. Encounter for long-term (current) use of other medications - Magnesium  5. Gout without tophus, unspecified cause, unspecified chronicity, unspecified site Continue allopurinol, weight loss discussed  6. Hyperlipidemia - Lipid panel  7. PreDiabetes Discussed general issues about diabetes pathophysiology and management., Educational material distributed., Suggested low cholesterol diet., Encouraged aerobic exercise., Discussed foot care., Reminded to get yearly retinal exam. - [facility] DIABETES FOOT EXAM  8. Vitamin D Deficiency - Vit D  25 hydroxy (rtn osteoporosis monitoring)  9. PMR (2011) Monitor, continue prednisone PRN  9. Morbid obesity, unspecified obesity type ([place]) Obesity with co morbidities- long discussion about weight loss, diet, and exercise  10. Hx of adenomatous colonic polyps Up to date  23 Encounter for Medicare annual wellness exam   Plan:   During the course of the visit the patient was educated and counseled about appropriate screening and preventive services including:    Pneumococcal vaccine   Influenza vaccine  Td vaccine  Screening electrocardiogram  Colorectal cancer screening  Diabetes screening  Glaucoma screening  Nutrition counseling   Conditions/risks identified: BMI: Discussed weight loss, diet, and increase physical activity.  Increase physical activity: AHA recommends 150 minutes of physical activity a week.  Medications reviewed Diabetes is at goal, ACE/ARB therapy: Yes. Urinary Incontinence is not an issue: discussed non pharmacology and pharmacology options.  Fall risk: low- discussed PT, home fall assessment, medications.    Subjective:  Andrew Cochran is a 75 y.o. male who presents for  Medicare Annual Wellness Visit and 3 month follow up for HTN, hyperlipidemia, prediabetes, and vitamin D Def.  Date of last medicare wellness visit was 09/2014  His blood pressure has been controlled at home, today their BP is BP: 140/80 mmHg He does not workout. He denies chest pain, shortness of breath, dizziness.  He has history of nonfocal TIA in 2008, no sequela.  He has history of PMR x 2011, has been tapered off steroids, does take rarely He is not on cholesterol medication and denies myalgias. His cholesterol is not at goal. The cholesterol last visit was:   Lab Results  Component Value Date   CHOL 210* 04/30/2015   HDL 60 04/30/2015   LDLCALC 138* 04/30/2015   TRIG 62 04/30/2015   CHOLHDL 3.5 04/30/2015   Last A1C in the office was normal, did have A1C of 6.4 in 2011 but has decreased with exercise/diet:  Lab Results  Component Value Date   HGBA1C 4.9 04/30/2015   Patient is on Vitamin D supplement.   Lab Results  Component Value Date   VD25OH 58 04/30/2015     He is on thyroid medication. His medication was not changed last visit. Patient denies nervousness, palpitations and weight changes.  Lab Results  Component Value Date   TSH 1.237 06/10/2015  .  Patient is on allopurinol for gout and does not report a recent flare.  He has 6 kids, 5 grand kids and 1 grand kid.  BMI is Body mass index is 31.92 kg/(m^2)., he is working on diet and exercise. Wt Readings from Last 3 Encounters:  08/02/15 202 lb 3.2 oz (91.717 kg)  04/30/15 194 lb 12.8 oz (88.361 kg)  01/20/15 193 lb (87.544 kg)   Patient is on allopurinol for gout and does  not report a recent flare, last flare 6 months ago.  Lab Results  Component Value Date   LABURIC 5.9 04/30/2015    Names of Other Physician/Practitioners you currently use: 1. [facility] here for primary care 2. Dr. Katy Fitch, eye doctor, Jan 2015, over due 3. [facility]. Pearson Forster at [facility],  dentist, last visit q 6 months, last cleaning 1 month ago Patient Care Team: Unk Pinto, MD as PCP - General (Internal Medicine) [facility] Grant Fontana., MD as Attending Physician (Urology) Gatha Mayer, MD as Consulting Physician (Gastroenterology) Clent Jacks, MD as Consulting Physician (Ophthalmology)  Medication Review: Current Outpatient Prescriptions on File Prior to Visit  Medication Sig Dispense Refill  . allopurinol (ZYLOPRIM) 300 MG tablet TAKE ONE TABLET BY MOUTH ONCE DAILY TO  PREVENT  GOUT 90 tablet 0  . Ascorbic Acid (VITAMIN C PO) Take 2,000 mg by mouth daily.     Marland Kitchen atenolol (TENORMIN) 100 MG tablet TAKE ONE TABLET BY MOUTH ONCE DAILY 90 tablet 0  . BABY ASPIRIN PO Take 81 mg by mouth daily.    . Blood Glucose Calibration (ACCU-CHEK AVIVA) SOLN See admin instructions.  99  . Blood Glucose Monitoring Suppl (ACCU-CHEK AVIVA PLUS) W/DEVICE KIT See admin instructions.  0  . Cholecalciferol (VITAMIN D PO) Take 10,000 Int'l Units by mouth daily.    Marland Kitchen EASY COMFORT LANCETS MISC   99  . glucose blood test strip Check blood sugar 1 time daily-DX-R73.09 100 each 6  . Lancet Devices (EASY MINI LANCING DEVICE) MISC See admin instructions.  99  . levothyroxine (SYNTHROID, LEVOTHROID) 200 MCG tablet TAKE ONE AND ONE-HALF TABLETS BY MOUTH ONCE DAILY 135 tablet 1  . MAGNESIUM PO Take 250 mg by mouth 3 (three) times daily.     . Omega-3 Fatty Acids (FISH OIL PO) Take 1,000 mg by mouth daily.     . Red Yeast Rice Extract (RED YEAST RICE PO) Take 1,000 mg by mouth daily.     . predniSONE (DELTASONE) 20 MG tablet TAKE ONE TABLET BY MOUTH THREE TIMES DAILY AS DIRECTED (Patient not taking: Reported on 08/02/2015) 30 tablet 0   No current facility-administered medications on file prior to visit.    Current Problems (verified) Patient Active Problem List   Diagnosis Date Noted  . Encounter for Medicare annual wellness exam 08/02/2015  . BMI 30.0-30.9,adult 05/01/2015  . Morbid obesity   (BMI 30.74) ([place]) 07/07/2014  . DJD  07/07/2014  . PMR (2011) 07/07/2014  . Hx of adenomatous colonic polyps 06/18/2014  . Umbilical hernia 74/16/3845  . Prediabetes 09/25/2013  . Vitamin D deficiency 09/25/2013  . Medication management 09/25/2013  . Cataracts, bilateral   . Gout   . Hypertension   . Hypothyroidism   . BPH with elevated PSA   . Hyperlipidemia      Immunization History  Administered Date(s) Administered  . Pneumococcal Polysaccharide-23 08/19/2008  . Td 09/07/2009    Preventative care: Last colonoscopy: 05/2014 Dr. Carlean Purl, + adenomas [place] AB 2012 US neck 2014 CXR 10/2013  Prior vaccinations: TD or Tdap: 2011  Influenza: declines Pneumococcal: 2010 Prevnar 13: declines Shingles/Zostavax: declines  Allergies Allergies  Allergen Reactions  . Lotensin [Benazepril Hcl]     unknown  . Penicillins Rash   Surgical history Past Surgical History  Procedure Laterality Date  . Cataract extraction Right 2009  . Shoulder arthroscopy Right     1979  . Colonoscopy w/ biopsies     Family history  Family History  Problem Relation Age of Onset  . Colon cancer Neg Hx   . Esophageal cancer Neg Hx   . Rectal cancer Neg Hx   . Stomach cancer Neg Hx   . Cancer Mother   . COPD Father     Risk Factors: Tobacco Social History  Substance Use Topics  . Smoking status: Former Smoker    Quit date: 05/30/1959  . Smokeless tobacco: Never Used  . Alcohol Use: No   MEDICARE WELLNESS OBJECTIVES: Tobacco use: He does not smoke.  Patient is a former smoker. If yes, counseling given Alcohol Current alcohol use: none Osteoporosis: dietary calcium and/or vitamin D deficiency, History of fracture in the past year: no Physical activity: Current Exercise Habits:: The patient does not participate in regular exercise at present Cardiac risk factors: Cardiac Risk Factors include: advanced age (>11mn, >>54women);dyslipidemia;hypertension;male gender;sedentary  lifestyle;obesity (BMI >30kg/m2) Depression/mood screen:   [facility] 08/02/2015  Decreased Interest 0  Down, Depressed, Hopeless 0  PHQ - 2 Score 0    ADLs:  In your present state of health, do you have any difficulty performing the following activities: 08/02/2015 05/01/2015  Hearing? N N  Vision? N N  Difficulty concentrating or making decisions? N N  Walking or climbing stairs? N N  Dressing or bathing? N N  Doing errands, shopping? N N  Preparing Food and eating ? N -  Using the Toilet? N -  In the past six months, have you accidently leaked urine? N -  Do you have problems with loss of bowel control? N -  Managing your Medications? N -  Managing your Finances? N -  Housekeeping or managing your Housekeeping? N -     Cognitive Testing  Alert? Yes  Normal Appearance?Yes  Oriented to person? Yes  Place? Yes   Time? Yes  Recall of three objects?  Yes  Can perform simple calculations? Yes  Displays appropriate judgment?Yes  Can read the correct time from a watch face?Yes  EOL planning: Does patient have an advance directive?: No Would patient like information on creating an advanced directive?: No - patient declined information   Objective:   Blood pressure 140/80, pulse 89, temperature 97.5 F (36.4 C), temperature source Temporal, resp. rate 16, height 5' 6.75" (1.695 m), weight 202 lb 3.2 oz (91.717 kg), SpO2 99 %. Body mass index is 31.92 kg/(m^2).  General appearance: alert, no distress, WD/WN, male HEENT: normocephalic, sclerae anicteric, TMs pearly, nares patent, no discharge or erythema, pharynx normal Oral cavity: MMM, no lesions Neck: supple, no lymphadenopathy, no thyromegaly, no masses Heart: RRR, normal S1, S2, no murmurs Lungs: CTA bilaterally, no wheezes, rhonchi, or rales Abdomen: +bs, soft, non tender, non distended, no masses, no hepatomegaly, no splenomegaly Musculoskeletal: nontender, no swelling, no obvious deformity Extremities: no  edema, no cyanosis, no clubbing Pulses: 2+ symmetric, upper and lower extremities, normal cap refill Neurological: alert, oriented x 3, CN2-12 intact, strength normal upper extremities and lower extremities, sensation normal throughout, DTRs 2+ throughout, no cerebellar signs, gait normal Skin: Has several scaly silver patches on legs, has non healing tender nodule right lateral ankle Psychiatric: normal affect, behavior normal, pleasant   Medicare Attestation I have personally reviewed: The patient's medical and social history Their use of alcohol, tobacco or illicit drugs Their current medications and supplements The patient's functional ability including ADLs,fall risks, home safety risks, cognitive, and hearing and visual impairment Diet and physical activities Evidence for depression or mood disorders  The patient's weight, height, BMI, and visual acuity have been recorded in the chart.  I have made referrals, counseling, and provided education to the patient based on review of the above and I have provided the patient with a written personalized care plan for preventive services.     Vicie Mutters, PA-C   08/02/2015

## 2015-08-03 ENCOUNTER — Encounter: Payer: Self-pay | Admitting: Physician Assistant

## 2015-08-03 LAB — VITAMIN D 25 HYDROXY (VIT D DEFICIENCY, FRACTURES): Vit D, 25-Hydroxy: 67 ng/mL (ref 30–100)

## 2015-08-13 ENCOUNTER — Other Ambulatory Visit: Payer: Self-pay | Admitting: Internal Medicine

## 2015-10-18 ENCOUNTER — Encounter: Payer: Self-pay | Admitting: Internal Medicine

## 2015-11-05 ENCOUNTER — Other Ambulatory Visit: Payer: Self-pay | Admitting: Internal Medicine

## 2015-11-08 ENCOUNTER — Encounter: Payer: Self-pay | Admitting: Internal Medicine

## 2015-11-08 NOTE — Patient Instructions (Signed)

## 2015-11-08 NOTE — Progress Notes (Signed)
Patient ID: Andrew Cochran, male   DOB: 09/05/1940, 75 y.o.   MRN: [id number]  [facility]   Unk Pinto, M.D.    Uvaldo Bristle. Silverio Lay, P.A.-C      Starlyn Skeans, P.A.-C   [facility]                [address] [place], [place] [id number] Telephone [phone] Telefax [phone] _________________________________  Annual  Screening/Preventative Visit And Comprehensive Evaluation & Examination     This very nice 75 y.o. MWM presents for a Wellness/Preventative Visit & comprehensive evaluation and management of multiple medical co-morbidities.  Patient has been followed for HTN, Prediabetes, Hyperlipidemia, Gout  and Vitamin D Deficiency. Patient has also been on Thyroid replacement since 2002.      HTN predates since 1997. Patient's BP has been controlled at home.Today's BP is sl elevated at 146/80. Patient denies any cardiac symptoms as chest pain, palpitations, shortness of breath, dizziness or ankle swelling.     Patient's hyperlipidemia is not controlled with diet and medications. Patient denies myalgias or other medication SE's. Last lipids were not at goal with elevated Cholesterol 229*; HDL 56; elevated LDL 154*; Triglycerides 95 on 08/02/2015.     Patient has hx/o Morbid Obesity (BMI 32+) and consequent  prediabetes since 2011 with A1c 6.4% and patient denies reactive hypoglycemic symptoms, visual blurring, diabetic polys or paresthesias. Last A1c was at goal with A1c 4.9% on  08/02/2015.       Finally, patient has history of Vitamin D Deficiency of "33" in 2008 and last vitamin D was 67 on 08/02/2015.   Medication Sig  . allopurinol  300 MG  TAKE ONE TABLET BY MOUTH ONCE DAILY TO  PREVENT  GOUT  . VITAMIN C  Take 2,000 mg by mouth daily.   Marland Kitchen atenolol 100 MG  TAKE ONE TABLET BY MOUTH ONCE DAILY  . BABY ASPIRIN Take 81 mg by mouth daily.  Marland Kitchen VITAMIN D  Take 10,000 Int'l Units by mouth daily.  Marland Kitchen  levothyroxine  200 MCG  TAKE ONE AND ONE-HALF TABLETS BY MOUTH ONCE DAILY  . MAGNESIUM PO Take 250 mg by mouth 3 (three) times daily.   . Omega-3FISH OIL Take 1,000 mg by mouth daily.   . Red Yeast Rice Extract  Take 1,000 mg by mouth daily.    Allergies  Allergen Reactions  . Lotensin [Benazepril Hcl]     unknown  . Penicillins Rash   Past Medical History  Diagnosis Date  . Gout   . Cataracts, bilateral   . Hypertension   . Thyroid disease   . BPH with elevated PSA   . Prediabetes   . Hyperlipidemia   . Diverticulosis   . Tubular adenoma 02/17/2003  . Hx of adenomatous colonic polyps 06/18/2014   Health Maintenance  Topic Date Due  . INFLUENZA VACCINE  03/23/2017 (Originally 12/28/2015)  . ZOSTAVAX  03/23/2017 (Originally 08/23/2002)  . PNA vac Low Risk Adult (2 of 2 - PCV13) 03/23/2017 (Originally 08/19/2009)  . COLONOSCOPY  06/12/2017  . TETANUS/TDAP  09/08/2019   Immunization History  Administered Date(s) Administered  . Pneumococcal Polysaccharide-23 08/19/2008  . Td 09/07/2009   Past Surgical History  Procedure Laterality Date  . Cataract extraction Right 2009  . Shoulder arthroscopy Right     1979  . Colonoscopy w/ biopsies     Family  History  Problem Relation Age of Onset  . Colon cancer Neg Hx   . Esophageal cancer Neg Hx   . Rectal cancer Neg Hx   . Stomach cancer Neg Hx   . Cancer Mother   . COPD Father    Social History   Social History  . Marital Status: Married    Spouse Name: N/A  . Number of Children: N/A  . Years of Education: N/A   Occupational History  . Retired Freight forwarder   Social History Main Topics  . Smoking status: Former Smoker    Quit date: 05/30/1959  . Smokeless tobacco: Never Used  . Alcohol Use: No  . Drug Use: No  . Sexual Activity: Not on file    ROS Constitutional: Denies fever, chills, weight loss/gain, headaches, insomnia,  night sweats or change in appetite. Does c/o fatigue. Eyes: Denies redness, blurred  vision, diplopia, discharge, itchy or watery eyes.  ENT: Denies discharge, congestion, post nasal drip, epistaxis, sore throat, earache, hearing loss, dental pain, Tinnitus, Vertigo, Sinus pain or snoring.  Cardio: Denies chest pain, palpitations, irregular heartbeat, syncope, dyspnea, diaphoresis, orthopnea, PND, claudication or edema Respiratory: denies cough, dyspnea, DOE, pleurisy, hoarseness, laryngitis or wheezing.  Gastrointestinal: Denies dysphagia, heartburn, reflux, water brash, pain, cramps, nausea, vomiting, bloating, diarrhea, constipation, hematemesis, melena, hematochezia, jaundice or hemorrhoids Genitourinary: Denies dysuria, frequency, urgency, nocturia, hesitancy, discharge, hematuria or flank pain Musculoskeletal: Denies arthralgia, myalgia, stiffness, Jt. Swelling, pain, limp or strain/sprain. Denies Falls. Skin: Denies puritis, rash, hives, warts, acne, eczema or change in skin lesion Neuro: No weakness, tremor, incoordination, spasms, paresthesia or pain Psychiatric: Denies confusion, memory loss or sensory loss. Denies Depression. Endocrine: Denies change in weight, skin, hair change, nocturia, and paresthesia, diabetic polys, visual blurring or hyper / hypo glycemic episodes.  Heme/Lymph: No excessive bleeding, bruising or enlarged lymph nodes.  Physical Exam  BP 146/80 mmHg  Pulse 72  Temp(Src) 97 F (36.1 C)  Resp 16  Ht 5\' 7"  (1.702 m)  Wt 206 lb 6.4 oz (93.622 kg)  BMI 32.32 kg/m2  General Appearance: Over nourished, in no apparent distress.  Eyes: PERRLA, EOMs, conjunctiva no swelling or erythema, normal fundi and vessels. Sinuses: No frontal/maxillary tenderness ENT/Mouth: EACs patent / TMs  nl. Nares clear without erythema, swelling, mucoid exudates. Oral hygiene is good. No erythema, swelling, or exudate. Tongue normal, non-obstructing. Tonsils not swollen or erythematous. Hearing normal.  Neck: Supple, thyroid normal. No bruits, nodes or  JVD. Respiratory: Respiratory effort normal.  BS equal and clear bilateral without rales, rhonci, wheezing or stridor. Cardio: Heart sounds are normal with regular rate and rhythm and no murmurs, rubs or gallops. Peripheral pulses are normal and equal bilaterally without edema. No aortic or femoral bruits. Chest: symmetric with normal excursions and percussion.  Abdomen: Soft, with Nl bowel sounds. Nontender, no guarding, rebound, hernias, masses, or organomegaly.  Lymphatics: Non tender without lymphadenopathy.  Genitourinary: No hernias.Testes nl. DRE - prostate nl for age - smooth & firm w/o nodules. Musculoskeletal: Full ROM all peripheral extremities, joint stability, 5/5 strength, and normal gait. Skin: Warm and dry without rashes, lesions, cyanosis, clubbing or  ecchymosis.  Neuro: Cranial nerves intact, reflexes equal bilaterally. Normal muscle tone, no cerebellar symptoms. Sensation intact.  Pysch: Alert and oriented X 3 with normal affect, insight and judgment appropriate.   Assessment and Plan  1. Annual Preventative/Screening Exam   - Microalbumin / creatinine urine ratio - EKG 12-Lead - [place], RETROPERITNL ABD,  LTD - POC Hemoccult Bld/Stl  -  Urinalysis, Routine w reflex microscopic - PSA - Uric acid - CBC with Differential/Platelet - BASIC METABOLIC PANEL WITH GFR - Hepatic function panel - Magnesium - Lipid panel - TSH - Hemoglobin A1c - Insulin, random - VITAMIN D 25 Hydroxy   2. Essential hypertension  - Microalbumin / creatinine urine ratio - EKG 12-Lead - [place], RETROPERITNL ABD,  LTD - TSH  3. Hyperlipidemia  - Lipid panel - TSH  4. Prediabetes  - Hemoglobin A1c - Insulin, random  5. Vitamin D deficiency  - VITAMIN D 25 Hydroxy   6. Morbid obesity due to excess calories ([place])   7. Hypothyroidism  - TSH  8. Idiopathic gout, unspecified chronicity  - Uric acid  9. Screening for rectal cancer  - POC Hemoccult Bld/Stl   10. Prostate  cancer screening  - PSA  11. Medication management  - Urinalysis, Routine w reflex microscopic - CBC with Differential/Platelet - BASIC METABOLIC PANEL WITH GFR - Hepatic function panel - Magnesium  12. Screening for AAA (aortic abdominal aneurysm)   13. Screening for ischemic heart disease   Continue prudent diet as discussed, weight control, BP monitoring, regular exercise, and medications as discussed.  Discussed med effects and SE's. Routine screening labs and tests as requested with regular follow-up as recommended. Over 40 minutes of exam, counseling, chart review and high complex critical decision making was performed

## 2015-11-09 ENCOUNTER — Encounter: Payer: Self-pay | Admitting: Internal Medicine

## 2015-11-09 ENCOUNTER — Ambulatory Visit (INDEPENDENT_AMBULATORY_CARE_PROVIDER_SITE_OTHER): Payer: Medicare Other | Admitting: Internal Medicine

## 2015-11-09 VITALS — BP 146/80 | HR 72 | Temp 97.0°F | Resp 16 | Ht 67.0 in | Wt 206.4 lb

## 2015-11-09 DIAGNOSIS — E039 Hypothyroidism, unspecified: Secondary | ICD-10-CM | POA: Diagnosis not present

## 2015-11-09 DIAGNOSIS — Z125 Encounter for screening for malignant neoplasm of prostate: Secondary | ICD-10-CM | POA: Diagnosis not present

## 2015-11-09 DIAGNOSIS — R7303 Prediabetes: Secondary | ICD-10-CM

## 2015-11-09 DIAGNOSIS — Z79899 Other long term (current) drug therapy: Secondary | ICD-10-CM

## 2015-11-09 DIAGNOSIS — Z0001 Encounter for general adult medical examination with abnormal findings: Secondary | ICD-10-CM

## 2015-11-09 DIAGNOSIS — E559 Vitamin D deficiency, unspecified: Secondary | ICD-10-CM | POA: Diagnosis not present

## 2015-11-09 DIAGNOSIS — Z136 Encounter for screening for cardiovascular disorders: Secondary | ICD-10-CM | POA: Diagnosis not present

## 2015-11-09 DIAGNOSIS — I10 Essential (primary) hypertension: Secondary | ICD-10-CM

## 2015-11-09 DIAGNOSIS — Z0000 Encounter for general adult medical examination without abnormal findings: Secondary | ICD-10-CM | POA: Diagnosis not present

## 2015-11-09 DIAGNOSIS — M109 Gout, unspecified: Secondary | ICD-10-CM | POA: Diagnosis not present

## 2015-11-09 DIAGNOSIS — E785 Hyperlipidemia, unspecified: Secondary | ICD-10-CM

## 2015-11-09 DIAGNOSIS — Z1212 Encounter for screening for malignant neoplasm of rectum: Secondary | ICD-10-CM

## 2015-11-09 DIAGNOSIS — M1000 Idiopathic gout, unspecified site: Secondary | ICD-10-CM

## 2015-11-09 DIAGNOSIS — R7309 Other abnormal glucose: Secondary | ICD-10-CM | POA: Diagnosis not present

## 2015-11-09 LAB — HEPATIC FUNCTION PANEL
ALT: 21 U/L (ref 9–46)
AST: 22 U/L (ref 10–35)
Albumin: 4 g/dL (ref 3.6–5.1)
Alkaline Phosphatase: 70 U/L (ref 40–115)
BILIRUBIN DIRECT: 0.1 mg/dL (ref <=0.2)
Indirect Bilirubin: 0.6 mg/dL (ref 0.2–1.2)
TOTAL PROTEIN: 6.5 g/dL (ref 6.1–8.1)
Total Bilirubin: 0.7 mg/dL (ref 0.2–1.2)

## 2015-11-09 LAB — CBC WITH DIFFERENTIAL/PLATELET
BASOS ABS: 78 {cells}/uL (ref 0–200)
Basophils Relative: 1 %
EOS PCT: 3 %
Eosinophils Absolute: 234 cells/uL (ref 15–500)
HCT: 43.8 % (ref 38.5–50.0)
HEMOGLOBIN: 15.4 g/dL (ref 13.2–17.1)
LYMPHS ABS: 1560 {cells}/uL (ref 850–3900)
LYMPHS PCT: 20 %
MCH: 31 pg (ref 27.0–33.0)
MCHC: 35.2 g/dL (ref 32.0–36.0)
MCV: 88.1 fL (ref 80.0–100.0)
MPV: 9.7 fL (ref 7.5–12.5)
Monocytes Absolute: 390 cells/uL (ref 200–950)
Monocytes Relative: 5 %
NEUTROS PCT: 71 %
Neutro Abs: 5538 cells/uL (ref 1500–7800)
Platelets: 227 10*3/uL (ref 140–400)
RBC: 4.97 MIL/uL (ref 4.20–5.80)
RDW: 14 % (ref 11.0–15.0)
WBC: 7.8 10*3/uL (ref 3.8–10.8)

## 2015-11-09 LAB — URIC ACID: URIC ACID, SERUM: 5.8 mg/dL (ref 4.0–8.0)

## 2015-11-09 LAB — LIPID PANEL
CHOLESTEROL: 212 mg/dL — AB (ref 125–200)
HDL: 55 mg/dL (ref >=40)
LDL Cholesterol: 141 mg/dL — ABNORMAL HIGH (ref <130)
Total CHOL/HDL Ratio: 3.9 Ratio (ref <=5.0)
Triglycerides: 81 mg/dL (ref <150)
VLDL: 16 mg/dL (ref <30)

## 2015-11-09 LAB — BASIC METABOLIC PANEL WITH GFR
BUN: 13 mg/dL (ref 7–25)
CALCIUM: 9.3 mg/dL (ref 8.6–10.3)
CO2: 28 mmol/L (ref 20–31)
CREATININE: 0.82 mg/dL (ref 0.70–1.18)
Chloride: 104 mmol/L (ref 98–110)
GFR, Est African American: >89 mL/min (ref >=60)
GFR, Est Non African American: 88 mL/min (ref >=60)
GLUCOSE: 90 mg/dL (ref 65–99)
Potassium: 4.2 mmol/L (ref 3.5–5.3)
SODIUM: 142 mmol/L (ref 135–146)

## 2015-11-09 LAB — HEMOGLOBIN A1C
HEMOGLOBIN A1C: 4.8 % (ref <5.7)
MEAN PLASMA GLUCOSE: 91 mg/dL

## 2015-11-09 LAB — TSH: TSH: 5.57 m[IU]/L — AB (ref 0.40–4.50)

## 2015-11-09 LAB — MAGNESIUM: Magnesium: 1.8 mg/dL (ref 1.5–2.5)

## 2015-11-10 LAB — URINALYSIS, MICROSCOPIC ONLY
Bacteria, UA: NONE SEEN [HPF]
CRYSTALS: NONE SEEN [HPF]
Casts: NONE SEEN [LPF]
Squamous Epithelial / LPF: NONE SEEN [HPF] (ref <=5)
Yeast: NONE SEEN [HPF]

## 2015-11-10 LAB — PSA: PSA: 3.89 ng/mL (ref <=4.00)

## 2015-11-10 LAB — VITAMIN D 25 HYDROXY (VIT D DEFICIENCY, FRACTURES): Vit D, 25-Hydroxy: 56 ng/mL (ref 30–100)

## 2015-11-10 LAB — URINALYSIS, ROUTINE W REFLEX MICROSCOPIC
Bilirubin Urine: NEGATIVE
Glucose, UA: NEGATIVE
HGB URINE DIPSTICK: NEGATIVE
KETONES UR: NEGATIVE
Leukocytes, UA: NEGATIVE
NITRITE: NEGATIVE
PH: 6 (ref 5.0–8.0)
Specific Gravity, Urine: 1.02 (ref 1.001–1.035)

## 2015-11-10 LAB — INSULIN, RANDOM: Insulin: 14.9 u[IU]/mL (ref 2.0–19.6)

## 2015-11-10 LAB — MICROALBUMIN / CREATININE URINE RATIO
CREATININE, URINE: 163 mg/dL (ref 20–370)
MICROALB UR: 69.8 mg/dL — AB
MICROALB/CREAT RATIO: 428 ug/mg{creat} — AB (ref <30)

## 2015-12-15 ENCOUNTER — Other Ambulatory Visit: Payer: Self-pay | Admitting: *Deleted

## 2015-12-15 MED ORDER — ALLOPURINOL 300 MG PO TABS: ORAL_TABLET | ORAL | Status: DC

## 2015-12-30 ENCOUNTER — Other Ambulatory Visit: Payer: Self-pay | Admitting: *Deleted

## 2015-12-30 MED ORDER — ALLOPURINOL 300 MG PO TABS: ORAL_TABLET | ORAL | 1 refills | Status: DC

## 2016-01-14 ENCOUNTER — Other Ambulatory Visit: Payer: Self-pay | Admitting: *Deleted

## 2016-01-14 MED ORDER — GLUCOSE BLOOD VI STRP: ORAL_STRIP | 3 refills | Status: DC

## 2016-01-14 MED ORDER — ACCU-CHEK AVIVA PLUS W/DEVICE KIT: PACK | 0 refills | Status: DC

## 2016-01-18 ENCOUNTER — Other Ambulatory Visit: Payer: Self-pay

## 2016-01-18 MED ORDER — ACCU-CHEK FASTCLIX LANCETS MISC: 1 refills | Status: DC

## 2016-01-20 ENCOUNTER — Other Ambulatory Visit: Payer: Self-pay | Admitting: *Deleted

## 2016-01-31 ENCOUNTER — Other Ambulatory Visit: Payer: Self-pay | Admitting: Internal Medicine

## 2016-04-17 ENCOUNTER — Other Ambulatory Visit: Payer: Self-pay | Admitting: *Deleted

## 2016-04-17 MED ORDER — LEVOTHYROXINE SODIUM 200 MCG PO TABS: 300.0000 ug | ORAL_TABLET | Freq: Every day | ORAL | 0 refills | Status: DC

## 2016-04-17 MED ORDER — ATENOLOL 100 MG PO TABS: 100.0000 mg | ORAL_TABLET | Freq: Every day | ORAL | 0 refills | Status: DC

## 2016-04-18 ENCOUNTER — Other Ambulatory Visit: Payer: Self-pay | Admitting: *Deleted

## 2016-04-18 MED ORDER — LEVOTHYROXINE SODIUM 200 MCG PO TABS: 300.0000 ug | ORAL_TABLET | Freq: Every day | ORAL | 0 refills | Status: DC

## 2016-05-10 ENCOUNTER — Encounter: Payer: Self-pay | Admitting: Internal Medicine

## 2016-05-10 ENCOUNTER — Ambulatory Visit (INDEPENDENT_AMBULATORY_CARE_PROVIDER_SITE_OTHER): Payer: Medicare Other | Admitting: Internal Medicine

## 2016-05-10 VITALS — BP 136/62 | HR 66 | Temp 98.2°F | Resp 18 | Ht 67.0 in | Wt 216.0 lb

## 2016-05-10 DIAGNOSIS — E559 Vitamin D deficiency, unspecified: Secondary | ICD-10-CM

## 2016-05-10 DIAGNOSIS — E782 Mixed hyperlipidemia: Secondary | ICD-10-CM

## 2016-05-10 DIAGNOSIS — E039 Hypothyroidism, unspecified: Secondary | ICD-10-CM | POA: Diagnosis not present

## 2016-05-10 DIAGNOSIS — R7303 Prediabetes: Secondary | ICD-10-CM | POA: Diagnosis not present

## 2016-05-10 DIAGNOSIS — Z79899 Other long term (current) drug therapy: Secondary | ICD-10-CM | POA: Diagnosis not present

## 2016-05-10 DIAGNOSIS — I10 Essential (primary) hypertension: Secondary | ICD-10-CM

## 2016-05-10 LAB — BASIC METABOLIC PANEL WITH GFR
BUN: 13 mg/dL (ref 7–25)
CALCIUM: 9.2 mg/dL (ref 8.6–10.3)
CO2: 26 mmol/L (ref 20–31)
CREATININE: 0.85 mg/dL (ref 0.70–1.18)
Chloride: 103 mmol/L (ref 98–110)
GFR, EST NON AFRICAN AMERICAN: 86 mL/min (ref >=60)
GLUCOSE: 124 mg/dL — AB (ref 65–99)
Potassium: 3.9 mmol/L (ref 3.5–5.3)
Sodium: 138 mmol/L (ref 135–146)

## 2016-05-10 LAB — HEPATIC FUNCTION PANEL
ALBUMIN: 3.9 g/dL (ref 3.6–5.1)
ALT: 20 U/L (ref 9–46)
AST: 24 U/L (ref 10–35)
Alkaline Phosphatase: 79 U/L (ref 40–115)
Bilirubin, Direct: 0.2 mg/dL (ref <=0.2)
Indirect Bilirubin: 0.7 mg/dL (ref 0.2–1.2)
TOTAL PROTEIN: 6.6 g/dL (ref 6.1–8.1)
Total Bilirubin: 0.9 mg/dL (ref 0.2–1.2)

## 2016-05-10 LAB — CBC WITH DIFFERENTIAL/PLATELET
BASOS ABS: 0 {cells}/uL (ref 0–200)
Basophils Relative: 0 %
EOS ABS: 222 {cells}/uL (ref 15–500)
Eosinophils Relative: 3 %
HEMATOCRIT: 39.8 % (ref 38.5–50.0)
Hemoglobin: 13.9 g/dL (ref 13.2–17.1)
LYMPHS ABS: 1554 {cells}/uL (ref 850–3900)
LYMPHS PCT: 21 %
MCH: 30.8 pg (ref 27.0–33.0)
MCHC: 34.9 g/dL (ref 32.0–36.0)
MCV: 88.2 fL (ref 80.0–100.0)
MPV: 9.5 fL (ref 7.5–12.5)
Monocytes Absolute: 370 cells/uL (ref 200–950)
Monocytes Relative: 5 %
NEUTROS ABS: 5254 {cells}/uL (ref 1500–7800)
NEUTROS PCT: 71 %
Platelets: 233 10*3/uL (ref 140–400)
RBC: 4.51 MIL/uL (ref 4.20–5.80)
RDW: 14.1 % (ref 11.0–15.0)
WBC: 7.4 10*3/uL (ref 3.8–10.8)

## 2016-05-10 LAB — LIPID PANEL
CHOLESTEROL: 182 mg/dL (ref <200)
HDL: 42 mg/dL (ref >40)
LDL Cholesterol: 122 mg/dL — ABNORMAL HIGH (ref <100)
TRIGLYCERIDES: 91 mg/dL (ref <150)
Total CHOL/HDL Ratio: 4.3 Ratio (ref <5.0)
VLDL: 18 mg/dL (ref <30)

## 2016-05-10 LAB — TSH: TSH: 0.12 mIU/L — ABNORMAL LOW (ref 0.40–4.50)

## 2016-05-10 MED ORDER — TRIAMCINOLONE ACETONIDE 0.1 % EX CREA: 1.0000 "application " | TOPICAL_CREAM | Freq: Three times a day (TID) | CUTANEOUS | 0 refills | Status: DC

## 2016-05-10 NOTE — Progress Notes (Signed)
Assessment and Plan:  Hypertension:  -Continue medication,  -monitor blood pressure at home.  -Continue DASH diet.   -Reminder to go to the ER if any CP, SOB, nausea, dizziness, severe HA, changes vision/speech, left arm numbness and tingling, and jaw pain.  Cholesterol: -Continue diet and exercise.  -Check cholesterol.   Pre-diabetes: -Continue diet and exercise.  -Check A1C  Vitamin D Def: -check level -continue medications.   Hypothyroidism -TSH -cont levothyroxine  Eczema -use dove soap -cont kenalog -conservative measures  Continue diet and meds as discussed. Further disposition pending results of labs.  HPI 75 y.o. male  presents for 3 month follow up with hypertension, hyperlipidemia, prediabetes and vitamin D.   His blood pressure has been controlled at home, today their BP is BP: 136/62.   He does not workout. He denies chest pain, shortness of breath, dizziness.   He is on cholesterol medication and denies myalgias. His cholesterol is at goal. The cholesterol last visit was:   Lab Results  Component Value Date   CHOL 212 (H) 11/09/2015   HDL 55 11/09/2015   LDLCALC 141 (H) 11/09/2015   TRIG 81 11/09/2015   CHOLHDL 3.9 11/09/2015     He has been working on diet and exercise for prediabetes, and denies foot ulcerations, hyperglycemia, hypoglycemia , increased appetite, nausea, paresthesia of the feet, polydipsia, polyuria, visual disturbances, vomiting and weight loss. Last A1C in the office was:  Lab Results  Component Value Date   HGBA1C 4.8 11/09/2015    Patient is on Vitamin D supplement.  Lab Results  Component Value Date   VD25OH 55 11/09/2015     He notes that he is currently in a trial for eczema and psoriasis.  The cream he has is helping.  He does find he has nighttime urinary frequency.  He drinks caffinated tea up to the time he goes to bed.  No daytime frequency, no weak stream, hesitancy, double voiding.  He does take his thyroid  medication daily on an empty stomach.    Current Medications:  Current Outpatient Prescriptions on File Prior to Visit  Medication Sig Dispense Refill  . ACCU-CHEK FASTCLIX LANCETS MISC TEST BLOOD SUGARS 2 DAILY Dx Code: E11.9 102 each 1  . allopurinol (ZYLOPRIM) 300 MG tablet TAKE ONE TABLET BY MOUTH ONCE DAILY TO  PREVENT  GOUT 90 tablet 1  . Ascorbic Acid (VITAMIN C PO) Take 2,000 mg by mouth daily.     Marland Kitchen atenolol (TENORMIN) 100 MG tablet Take 1 tablet (100 mg total) by mouth daily. 90 tablet 0  . BABY ASPIRIN PO Take 81 mg by mouth daily.    . Blood Glucose Calibration (ACCU-CHEK AVIVA) SOLN See admin instructions.  99  . Blood Glucose Monitoring Suppl (ACCU-CHEK AVIVA PLUS) w/Device KIT Check blood sugar 1 time daily-DX-R73.09 1 kit 0  . Cholecalciferol (VITAMIN D PO) Take 10,000 Int'l Units by mouth daily.    Marland Kitchen EASY COMFORT LANCETS MISC   99  . glucose blood (ACCU-CHEK AVIVA PLUS) test strip Use as instructed 100 each 3  . levothyroxine (SYNTHROID, LEVOTHROID) 200 MCG tablet Take 1.5 tablets (300 mcg total) by mouth daily. 135 tablet 0  . MAGNESIUM PO Take 250 mg by mouth 3 (three) times daily.     . Omega-3 Fatty Acids (FISH OIL PO) Take 1,000 mg by mouth daily.     . predniSONE (DELTASONE) 20 MG tablet TAKE ONE TABLET BY MOUTH THREE TIMES DAILY AS DIRECTED 30 tablet 0  .  Red Yeast Rice Extract (RED YEAST RICE PO) Take 1,000 mg by mouth daily.      No current facility-administered medications on file prior to visit.     Medical History:  Past Medical History:  Diagnosis Date  . BPH with elevated PSA   . Cataracts, bilateral   . Diverticulosis   . Gout   . Hx of adenomatous colonic polyps 06/18/2014  . Hyperlipidemia   . Hypertension   . Prediabetes   . Thyroid disease   . Tubular adenoma 02/17/2003    Allergies:  Allergies  Allergen Reactions  . Lotensin [Benazepril Hcl]     unknown  . Penicillins Rash     Review of Systems:  Review of Systems  Constitutional:  Negative for chills, fever and malaise/fatigue.  HENT: Negative for congestion, ear pain and sore throat.   Eyes: Negative.   Respiratory: Negative for cough, shortness of breath and wheezing.   Cardiovascular: Negative for chest pain, palpitations and leg swelling.  Gastrointestinal: Negative for abdominal pain, blood in stool, constipation, diarrhea, heartburn and melena.  Genitourinary: Negative.   Skin: Negative.   Neurological: Negative for dizziness, sensory change, loss of consciousness and headaches.  Psychiatric/Behavioral: Negative for depression. The patient is not nervous/anxious and does not have insomnia.     Family history- Review and unchanged  Social history- Review and unchanged  Physical Exam: BP 136/62   Pulse 66   Temp 98.2 F (36.8 C) (Temporal)   Resp 18   Ht '5\' 7"'  (1.702 m)   Wt 216 lb (98 kg)   BMI 33.83 kg/m  Wt Readings from Last 3 Encounters:  05/10/16 216 lb (98 kg)  11/09/15 206 lb 6.4 oz (93.6 kg)  08/02/15 202 lb 3.2 oz (91.7 kg)    General Appearance: Well nourished well developed, in no apparent distress. Eyes: PERRLA, EOMs, conjunctiva no swelling or erythema ENT/Mouth: Ear canals normal without obstruction, swelling, erythma, discharge.  TMs normal bilaterally.  Oropharynx moist, clear, without exudate, or postoropharyngeal swelling. Neck: Supple, thyroid normal,no cervical adenopathy  Respiratory: Respiratory effort normal, Breath sounds clear A&P without rhonchi, wheeze, or rale.  No retractions, no accessory usage. Cardio: RRR with no MRGs. Brisk peripheral pulses without edema.  Abdomen: Soft, + BS,  Non tender, no guarding, rebound, hernias, masses. Musculoskeletal: Full ROM, 5/5 strength, Normal gait Skin: Warm, dry without rashes, lesions, ecchymosis.  Neuro: Awake and oriented X 3, Cranial nerves intact. Normal muscle tone, no cerebellar symptoms. Psych: Normal affect, Insight and Judgment appropriate.    Starlyn Skeans,  PA-C 2:40 PM [facility]

## 2016-05-11 ENCOUNTER — Encounter: Payer: Self-pay | Admitting: *Deleted

## 2016-05-11 LAB — HEMOGLOBIN A1C
HEMOGLOBIN A1C: 4.6 % (ref <5.7)
MEAN PLASMA GLUCOSE: 85 mg/dL

## 2016-05-11 LAB — VITAMIN D 25 HYDROXY (VIT D DEFICIENCY, FRACTURES): VIT D 25 HYDROXY: 64 ng/mL (ref 30–100)

## 2016-05-15 ENCOUNTER — Other Ambulatory Visit: Payer: Self-pay | Admitting: Internal Medicine

## 2016-06-05 ENCOUNTER — Other Ambulatory Visit: Payer: Self-pay | Admitting: Internal Medicine

## 2016-06-07 ENCOUNTER — Telehealth: Payer: Self-pay | Admitting: *Deleted

## 2016-06-07 NOTE — Telephone Encounter (Signed)
Spouse called and expressed concern about the patient's memory. She would like for the patient to be evaluated for memory.  The patient has an OV on 06/12/2016 and this will be addressed.

## 2016-06-12 ENCOUNTER — Ambulatory Visit: Payer: Self-pay

## 2016-06-12 ENCOUNTER — Ambulatory Visit: Payer: Self-pay | Admitting: Internal Medicine

## 2016-07-03 ENCOUNTER — Ambulatory Visit (INDEPENDENT_AMBULATORY_CARE_PROVIDER_SITE_OTHER): Payer: Medicare Other | Admitting: *Deleted

## 2016-07-03 DIAGNOSIS — E039 Hypothyroidism, unspecified: Secondary | ICD-10-CM | POA: Diagnosis not present

## 2016-07-03 LAB — TSH: TSH: 0.4 m[IU]/L (ref 0.40–4.50)

## 2016-07-03 NOTE — Progress Notes (Signed)
Patient unsure of how he is currently taking his levothyroxine Rx.  States "he has the schedule written down and follow that."

## 2016-07-06 ENCOUNTER — Other Ambulatory Visit: Payer: Self-pay | Admitting: Internal Medicine

## 2016-07-17 ENCOUNTER — Encounter: Payer: Self-pay | Admitting: Internal Medicine

## 2016-07-17 ENCOUNTER — Ambulatory Visit (INDEPENDENT_AMBULATORY_CARE_PROVIDER_SITE_OTHER): Payer: Medicare Other | Admitting: Internal Medicine

## 2016-07-17 VITALS — BP 140/78 | HR 100 | Temp 98.4°F | Resp 16 | Ht 67.0 in | Wt 215.0 lb

## 2016-07-17 DIAGNOSIS — J069 Acute upper respiratory infection, unspecified: Secondary | ICD-10-CM

## 2016-07-17 MED ORDER — FLUTICASONE PROPIONATE 50 MCG/ACT NA SUSP: 2.0000 | Freq: Every day | NASAL | 0 refills | Status: DC

## 2016-07-17 MED ORDER — PREDNISONE 20 MG PO TABS: ORAL_TABLET | ORAL | 0 refills | Status: DC

## 2016-07-17 MED ORDER — PROMETHAZINE-DM 6.25-15 MG/5ML PO SYRP: ORAL_SOLUTION | ORAL | 1 refills | Status: DC

## 2016-07-17 NOTE — Patient Instructions (Signed)
Take flonase 2 sprays per nostril at bedtime.  Look down at your toes while you are spraying.  Do not snort the medication up.  It sprays where it is designed to go.  Take a daily Claritin, zyrtec, OR allegra.  Store brand and generic medications work well.   Please take prednisone daily with breakfast.  Take 3 tablets on day 1, take 2 tablets daily for days 2-5.  Please use phenergan dm cough syrup up to 3 times daily to help with cough and chest congestion.    Call the office if you develop fevers, chills, or colored sputum.

## 2016-07-17 NOTE — Progress Notes (Signed)
HPI  Patient presents to the office for evaluation of chest congestion.  It has been going on for 1 weeks.  Patient reports dry, barky, cough with the congestion.  They also endorse change in voice, postnasal drip and nasal congestion, pale yellow nasal sputum, mild shortness of breath with activity..  They have tried none.  They report that nothing has worked.  They admits to other sick contacts.  He reports that his daughter has some similar symptoms.   Review of Systems  Constitutional: Positive for malaise/fatigue. Negative for chills and fever.  HENT: Positive for congestion, ear pain, hearing loss and sore throat.   Respiratory: Positive for cough. Negative for sputum production, shortness of breath and wheezing.   Cardiovascular: Negative for chest pain, palpitations and leg swelling.  Neurological: Positive for headaches.    PE:  Vitals:   07/17/16 1539  BP: 140/78  Pulse: 100  Resp: 16  Temp: 98.4 F (36.9 C)    General:  Alert and non-toxic, WDWN, NAD HEENT: NCAT, PERLA, EOM normal, no occular discharge or erythema.  Nasal mucosal edema with sinus tenderness to palpation.  Oropharynx clear with minimal oropharyngeal edema and erythema.  Mucous membranes moist and pink. Neck:  Cervical adenopathy Chest:  RRR no MRGs.  Lungs clear to auscultation A&P with no wheezes rhonchi or rales.   Abdomen: +BS x 4 quadrants, soft, non-tender, no guarding, rigidity, or rebound. Skin: warm and dry no rash Neuro: A&Ox4, CN II-XII grossly intact  Assessment and Plan:   1. Acute URI -likely allergies vs. Viral -no need for abx currently - predniSONE (DELTASONE) 20 MG tablet; 3 tabs po daily x 3 days, then 2 tabs x 3 days, then 1.5 tabs x 3 days, then 1 tab x 3 days, then 0.5 tabs x 3 days  Dispense: 27 tablet; Refill: 0 - fluticasone (FLONASE) 50 MCG/ACT nasal spray; Place 2 sprays into both nostrils daily.  Dispense: 16 g; Refill: 0 - promethazine-dextromethorphan (PROMETHAZINE-DM)  6.25-15 MG/5ML syrup; Take 5-10 ML PO q8hrs prn for cough and congestion  Dispense: 360 mL; Refill: 1  .

## 2016-08-24 ENCOUNTER — Other Ambulatory Visit: Payer: Self-pay | Admitting: *Deleted

## 2016-08-24 ENCOUNTER — Other Ambulatory Visit: Payer: Self-pay | Admitting: Internal Medicine

## 2016-08-24 MED ORDER — ACCU-CHEK FASTCLIX LANCETS MISC: 4 refills | Status: DC

## 2016-08-28 ENCOUNTER — Telehealth: Payer: Self-pay | Admitting: *Deleted

## 2016-08-28 NOTE — Telephone Encounter (Signed)
Patient called and asked if he can stop taking Allopurinol since he is no longer drinking alcohol x 2 years.  Per Dr Melford Aase, it is OK to stop and his uric acid level can be checked at his next OV, or sooner if he has a gout attack.  Patient is aware.

## 2016-08-31 ENCOUNTER — Other Ambulatory Visit: Payer: Self-pay | Admitting: *Deleted

## 2016-08-31 ENCOUNTER — Ambulatory Visit (INDEPENDENT_AMBULATORY_CARE_PROVIDER_SITE_OTHER): Payer: Medicare PPO | Admitting: Internal Medicine

## 2016-08-31 ENCOUNTER — Encounter: Payer: Self-pay | Admitting: Internal Medicine

## 2016-08-31 VITALS — BP 134/70 | HR 60 | Temp 97.3°F | Resp 16 | Ht 67.0 in | Wt 216.2 lb

## 2016-08-31 DIAGNOSIS — S301XXA Contusion of abdominal wall, initial encounter: Secondary | ICD-10-CM

## 2016-08-31 MED ORDER — MELOXICAM 15 MG PO TABS: ORAL_TABLET | ORAL | 1 refills | Status: DC

## 2016-08-31 MED ORDER — TRAMADOL HCL 50 MG PO TABS: ORAL_TABLET | ORAL | 0 refills | Status: AC

## 2016-08-31 MED ORDER — MELOXICAM 15 MG PO TABS: ORAL_TABLET | ORAL | 1 refills | Status: AC

## 2016-08-31 NOTE — Progress Notes (Signed)
  Subjective:    Patient ID: Andrew Cochran, male    DOB: 01-19-1941, 76 y.o.   MRN: [id number]  HPI  Patient relates falling off the toilet about 2 weeks ago sustaining a contusion to his left flank and chest.   Medication Sig  . VITAMIN C Take 2,000 mg by mouth daily.   Marland Kitchen atenolol  100 MG Take 1 tablet (100 mg total) by mouth daily.  Marland Kitchen BABY ASPIRIN Take 81 mg by mouth daily.  Marland Kitchen VITAMIN D Take 10,000 Int'l Units by mouth daily.  Marland Kitchen FLONASE nasal spray Place 2 sprays into both nostrils daily.  Marland Kitchen levothyroxine  200 MCG TAKE 1 AND 1/2 TABLETS BY  MOUTH DAILY  . MAGNESIUM Take 250 mg by mouth 3 (three) times daily.   Marland Kitchen VITAMIN K2 Take by mouth daily.  . Omega-3 FISH OIL  Take 1,000 mg by mouth daily.   . Red Yeast Rice Extract  Take 1,000 mg by mouth daily.   Marland Kitchen triamcinolone crm  0.1 % Apply 1 application topically 3 (three) times daily.  Marland Kitchen allopurinol  300 MG TAKE ONE TAB ONCE DAILY TO  PREVENT  GOUT   Allergies  Allergen Reactions  . Lotensin [Benazepril Hcl]     unknown  . Penicillins Rash   Review of Systems  10 point systems review negative except as above.    Objective:   Physical Exam  BP 134/70   Pulse 60   Temp 97.3 F (36.3 C)   Resp 16   Ht 5\' 7"  (1.702 m)   Wt 216 lb 3.2 oz (98.1 kg)   BMI 33.86 kg/m   No Distress.   HEENT - Eac's patent. TM's Nl. EOM's full. PERRLA. NasoOroPharynx clear. Neck - supple. Nl Thyroid. Carotids 2+ & No bruits, nodes, JVD Chest - Clear equal BS w/o Rales, rhonchi, wheezes. Tender left posterolateral chest and flank with near resolved ecchymoses and palpable tenderness.  Cor - Nl HS. RRR w/o sig MGR. PP 1(+). No edema. Abd - Soft . Benign. MS- FROM w/o deformities. Muscle power, tone and bulk Nl. Gait Nl. Neuro - No obvious Cr N abnormalities. Nl w/o focal abnormalities.    Assessment & Plan:   1. Contusion of flank and back, initial encounter  - traMADol (ULTRAM) 50 MG tablet; Take 1/2 to 1 tablet every 4 hours of needed for  severe pain  Dispense: 30 tablet; Refill: 0  - meloxicam (MOBIC) 15 MG tablet; Take 1/2 to 1 tablet daily with food for pain & inflammation  Dispense: 30 tablet; Refill: 1  - Recc heating pad.

## 2016-08-31 NOTE — Patient Instructions (Signed)
Contusion A contusion is a deep bruise. Contusions are the result of a blunt injury to tissues and muscle fibers under the skin. The injury causes bleeding under the skin. The skin overlying the contusion may turn blue, purple, or yellow. Minor injuries will give you a painless contusion, but more severe contusions may stay painful and swollen for a few weeks. What are the causes? This condition is usually caused by a blow, trauma, or direct force to an area of the body. What are the signs or symptoms? Symptoms of this condition include:  Swelling of the injured area.  Pain and tenderness in the injured area.  Discoloration. The area may have redness and then turn blue, purple, or yellow. How is this diagnosed? This condition is diagnosed based on a physical exam and medical history. An X-ray, CT scan, or MRI may be needed to determine if there are any associated injuries, such as broken bones (fractures). How is this treated? Specific treatment for this condition depends on what area of the body was injured. In general, the best treatment for a contusion is resting, icing, applying pressure to (compression), and elevating the injured area. This is often called the RICE strategy. Over-the-counter anti-inflammatory medicines may also be recommended for pain control. Follow these instructions at home:  Rest the injured area.  If directed, apply ice to the injured area:  Put ice in a plastic bag.  Place a towel between your skin and the bag.  Leave the ice on for 20 minutes, 2-3 times per day.  If directed, apply light compression to the injured area using an elastic bandage. Make sure the bandage is not wrapped too tightly. Remove and reapply the bandage as directed by your health care provider.  If possible, raise (elevate) the injured area above the level of your heart while you are sitting or lying down.  Take over-the-counter and prescription medicines only as told by your health  care provider. Contact a health care provider if:  Your symptoms do not improve after several days of treatment.  Your symptoms get worse.  You have difficulty moving the injured area. Get help right away if:  You have severe pain.  You have numbness in a hand or foot.  Your hand or foot turns pale or cold. This information is not intended to replace advice given to you by your health care provider. Make sure you discuss any questions you have with your health care provider. Document Released: 02/22/2005 Document Revised: 09/23/2015 Document Reviewed: 09/30/2014 Elsevier Interactive Patient Education  2017 [employer] Inc[employer]  

## 2016-09-11 ENCOUNTER — Other Ambulatory Visit: Payer: Self-pay | Admitting: *Deleted

## 2016-09-11 MED ORDER — ATENOLOL 100 MG PO TABS: 100.0000 mg | ORAL_TABLET | Freq: Every day | ORAL | 1 refills | Status: DC

## 2016-09-19 ENCOUNTER — Other Ambulatory Visit: Payer: Self-pay | Admitting: *Deleted

## 2016-09-19 MED ORDER — ACCU-CHEK SMARTVIEW CONTROL VI LIQD: 3 refills | Status: AC

## 2016-09-19 MED ORDER — GLUCOSE BLOOD VI STRP: ORAL_STRIP | 3 refills | Status: DC

## 2016-09-19 MED ORDER — ACCU-CHEK NANO SMARTVIEW W/DEVICE KIT: PACK | 0 refills | Status: DC

## 2016-09-19 MED ORDER — ALLOPURINOL 300 MG PO TABS: 300.0000 mg | ORAL_TABLET | Freq: Every day | ORAL | 1 refills | Status: DC

## 2016-09-19 MED ORDER — ACCU-CHEK FASTCLIX LANCETS MISC: 3 refills | Status: DC

## 2016-09-19 MED ORDER — ATENOLOL 100 MG PO TABS: 100.0000 mg | ORAL_TABLET | Freq: Every day | ORAL | 1 refills | Status: DC

## 2016-09-19 MED ORDER — LEVOTHYROXINE SODIUM 200 MCG PO TABS: 300.0000 ug | ORAL_TABLET | Freq: Every day | ORAL | 1 refills | Status: DC

## 2016-09-19 MED ORDER — PREDNISONE 20 MG PO TABS: 20.0000 mg | ORAL_TABLET | Freq: Every day | ORAL | 0 refills | Status: DC

## 2016-09-20 ENCOUNTER — Other Ambulatory Visit: Payer: Self-pay | Admitting: Internal Medicine

## 2016-09-21 ENCOUNTER — Other Ambulatory Visit: Payer: Self-pay | Admitting: *Deleted

## 2016-09-21 MED ORDER — ALLOPURINOL 300 MG PO TABS: 300.0000 mg | ORAL_TABLET | Freq: Every day | ORAL | 1 refills | Status: DC

## 2016-09-21 MED ORDER — ATENOLOL 100 MG PO TABS: 100.0000 mg | ORAL_TABLET | Freq: Every day | ORAL | 1 refills | Status: DC

## 2016-10-13 ENCOUNTER — Other Ambulatory Visit: Payer: Self-pay | Admitting: Internal Medicine

## 2016-12-13 ENCOUNTER — Encounter: Payer: Self-pay | Admitting: Internal Medicine

## 2016-12-13 ENCOUNTER — Ambulatory Visit (INDEPENDENT_AMBULATORY_CARE_PROVIDER_SITE_OTHER): Payer: Medicare PPO | Admitting: Internal Medicine

## 2016-12-13 VITALS — BP 128/76 | HR 64 | Temp 97.5°F | Resp 16 | Ht 67.0 in | Wt 220.6 lb

## 2016-12-13 DIAGNOSIS — R6889 Other general symptoms and signs: Secondary | ICD-10-CM | POA: Diagnosis not present

## 2016-12-13 DIAGNOSIS — E782 Mixed hyperlipidemia: Secondary | ICD-10-CM

## 2016-12-13 DIAGNOSIS — Z0001 Encounter for general adult medical examination with abnormal findings: Secondary | ICD-10-CM | POA: Diagnosis not present

## 2016-12-13 DIAGNOSIS — Z79899 Other long term (current) drug therapy: Secondary | ICD-10-CM

## 2016-12-13 DIAGNOSIS — M1000 Idiopathic gout, unspecified site: Secondary | ICD-10-CM

## 2016-12-13 DIAGNOSIS — I10 Essential (primary) hypertension: Secondary | ICD-10-CM

## 2016-12-13 DIAGNOSIS — E039 Hypothyroidism, unspecified: Secondary | ICD-10-CM

## 2016-12-13 DIAGNOSIS — Z125 Encounter for screening for malignant neoplasm of prostate: Secondary | ICD-10-CM

## 2016-12-13 DIAGNOSIS — R7303 Prediabetes: Secondary | ICD-10-CM

## 2016-12-13 DIAGNOSIS — E559 Vitamin D deficiency, unspecified: Secondary | ICD-10-CM

## 2016-12-13 DIAGNOSIS — Z1212 Encounter for screening for malignant neoplasm of rectum: Secondary | ICD-10-CM

## 2016-12-13 DIAGNOSIS — Z136 Encounter for screening for cardiovascular disorders: Secondary | ICD-10-CM | POA: Diagnosis not present

## 2016-12-13 LAB — CBC WITH DIFFERENTIAL/PLATELET
BASOS ABS: 0 {cells}/uL (ref 0–200)
BASOS PCT: 0 %
EOS ABS: 222 {cells}/uL (ref 15–500)
Eosinophils Relative: 3 %
HEMATOCRIT: 45.4 % (ref 38.5–50.0)
Hemoglobin: 15.8 g/dL (ref 13.2–17.1)
LYMPHS PCT: 22 %
Lymphs Abs: 1628 cells/uL (ref 850–3900)
MCH: 31.1 pg (ref 27.0–33.0)
MCHC: 34.8 g/dL (ref 32.0–36.0)
MCV: 89.4 fL (ref 80.0–100.0)
MONO ABS: 592 {cells}/uL (ref 200–950)
MONOS PCT: 8 %
MPV: 9.9 fL (ref 7.5–12.5)
NEUTROS PCT: 67 %
Neutro Abs: 4958 cells/uL (ref 1500–7800)
PLATELETS: 222 10*3/uL (ref 140–400)
RBC: 5.08 MIL/uL (ref 4.20–5.80)
RDW: 13.8 % (ref 11.0–15.0)
WBC: 7.4 10*3/uL (ref 3.8–10.8)

## 2016-12-13 LAB — BASIC METABOLIC PANEL WITH GFR
BUN: 16 mg/dL (ref 7–25)
CO2: 23 mmol/L (ref 20–31)
CREATININE: 0.91 mg/dL (ref 0.70–1.18)
Calcium: 9.4 mg/dL (ref 8.6–10.3)
Chloride: 103 mmol/L (ref 98–110)
GFR, Est Non African American: 83 mL/min (ref >=60)
GLUCOSE: 107 mg/dL — AB (ref 65–99)
POTASSIUM: 3.8 mmol/L (ref 3.5–5.3)
Sodium: 139 mmol/L (ref 135–146)

## 2016-12-13 LAB — HEPATIC FUNCTION PANEL
ALBUMIN: 4.1 g/dL (ref 3.6–5.1)
ALK PHOS: 100 U/L (ref 40–115)
ALT: 18 U/L (ref 9–46)
AST: 19 U/L (ref 10–35)
BILIRUBIN TOTAL: 1 mg/dL (ref 0.2–1.2)
Bilirubin, Direct: 0.2 mg/dL (ref <=0.2)
Indirect Bilirubin: 0.8 mg/dL (ref 0.2–1.2)
TOTAL PROTEIN: 6.6 g/dL (ref 6.1–8.1)

## 2016-12-13 LAB — LIPID PANEL
Cholesterol: 223 mg/dL — ABNORMAL HIGH (ref <200)
HDL: 45 mg/dL (ref >40)
LDL CALC: 157 mg/dL — AB (ref <100)
TRIGLYCERIDES: 104 mg/dL (ref <150)
Total CHOL/HDL Ratio: 5 Ratio — ABNORMAL HIGH (ref <5.0)
VLDL: 21 mg/dL (ref <30)

## 2016-12-13 LAB — TSH: TSH: 3.86 m[IU]/L (ref 0.40–4.50)

## 2016-12-13 NOTE — Progress Notes (Signed)
[facility]   Unk Pinto, M.D.      Uvaldo Bristle. Silverio Lay, P.A.-C [facility]                [address] [place] [address] Telephone [phone] Telefax [phone] Annual  Screening/Preventative Visit  & Comprehensive Evaluation & Examination     This very nice 76 y.o. mwm presents for a Screening/Preventative Visit & comprehensive evaluation and management of multiple medical co-morbidities.  Patient has been followed for HTN, Prediabetes, Hyperlipidemia, Gout  and Vitamin D Deficiency. Patient's Gout has been asymptomatic since he stopped drinking alcohol.      HTN predates circa 1997. Patient's BP has been controlled at home.  Today's BP is at goal - 128/76. Patient denies any cardiac symptoms as chest pain, palpitations, shortness of breath, dizziness or ankle swelling.     Patient's hyperlipidemia is controlled with diet and medications. Patient denies myalgias or other medication SE's. Last lipids were not at goal:  Lab Results  Component Value Date   CHOL 182 05/10/2016   HDL 42 05/10/2016   LDLCALC 122 (H) 05/10/2016   TRIG 91 05/10/2016   CHOLHDL 4.3 05/10/2016      Patient has Morbid Obesity  (BMI 34+) and prediabetes (A1C 6.4% in 2011)  and patient denies reactive hypoglycemic symptoms, visual blurring, diabetic polys or paresthesias. Last A1c was at goal: Lab Results  Component Value Date   HGBA1C 4.6 05/10/2016       Patient has been on Thyroid Replacement since 2002.  Finally, patient has history of Vitamin D Deficiency ("24" IN 2008)  and last vitamin D was at goal: Lab Results  Component Value Date   VD25OH 64 05/10/2016   Current Outpatient Prescriptions on File Prior to Visit  Medication Sig  . allopurinol  300 MG tablet Take 1 tablet (300 mg total) by mouth daily.  Marland Kitchen VITAMIN C Take 2,000 mg by mouth daily.   Marland Kitchen atenolol  100 MG tablet Take 1 tablet (100 mg total)  by mouth daily.  Marland Kitchen BABY ASPIRIN  Take 81 mg by mouth daily.  Marland Kitchen VITAMIN D Take 10,000 Int'l Units by mouth daily.  Marland Kitchen FLONASE  nasal spray Place 2 sprays into both nostrils daily.  Marland Kitchen levothyroxine  200 MCG  Take 1.5 tablets (300 mcg total) by mouth daily.  Marland Kitchen MAGNESIUM  Take 250 mg by mouth 3 (three) times daily.   . Omega-3 FISH OIL  Take 1,000 mg by mouth daily.   . Red Yeast Rice Extract  Take 1,000 mg by mouth daily.   Marland Kitchen triamcinolone crm  0.1 % Apply 1 application topically 3 (three) times daily.   Allergies  Allergen Reactions  . Lotensin [Benazepril Hcl]     unknown  . Penicillins Rash   Past Medical History:  Diagnosis Date  . BPH with elevated PSA   . Cataracts, bilateral   . Diverticulosis   . Gout   . Hx of adenomatous colonic polyps 06/18/2014  . Hyperlipidemia   . Hypertension   . Prediabetes   . Thyroid disease   . Tubular adenoma 02/17/2003   Health Maintenance  Topic Date Due  . INFLUENZA VACCINE  03/23/2017 (Originally 12/27/2016)  . PNA vac Low Risk Adult (2 of 2 - PCV13) 03/23/2017 (Originally 08/19/2009)  . COLONOSCOPY  06/12/2017  . TETANUS/TDAP  09/08/2019  Immunization History  Administered Date(s) Administered  . Pneumococcal Polysaccharide-23 08/19/2008  . Td 09/07/2009   Past Surgical History:  Procedure Laterality Date  . CATARACT EXTRACTION Right 2009  . COLONOSCOPY W/ BIOPSIES    . SHOULDER ARTHROSCOPY Right    1979   Family History  Problem Relation Age of Onset  . Colon cancer Neg Hx   . Esophageal cancer Neg Hx   . Rectal cancer Neg Hx   . Stomach cancer Neg Hx   . Cancer Mother   . COPD Father    Social History   Social History  . Marital status: Married    Spouse name: N/A  . Number of children: N/A  . Years of education: N/A   Occupational History  . Not on file.   Social History Main Topics  . Smoking status: Former Smoker    Quit date: 05/30/1959  . Smokeless tobacco: Never Used  . Alcohol use No  . Drug use: No  .  Sexual activity: Not on file    ROS Constitutional: Denies fever, chills, weight loss/gain, headaches, insomnia,  night sweats or change in appetite. Does c/o fatigue. Eyes: Denies redness, blurred vision, diplopia, discharge, itchy or watery eyes.  ENT: Denies discharge, congestion, post nasal drip, epistaxis, sore throat, earache, hearing loss, dental pain, Tinnitus, Vertigo, Sinus pain or snoring.  Cardio: Denies chest pain, palpitations, irregular heartbeat, syncope, dyspnea, diaphoresis, orthopnea, PND, claudication or edema Respiratory: denies cough, dyspnea, DOE, pleurisy, hoarseness, laryngitis or wheezing.  Gastrointestinal: Denies dysphagia, heartburn, reflux, water brash, pain, cramps, nausea, vomiting, bloating, diarrhea, constipation, hematemesis, melena, hematochezia, jaundice or hemorrhoids Genitourinary: Denies dysuria, frequency, urgency, nocturia, hesitancy, discharge, hematuria or flank pain Musculoskeletal: Denies arthralgia, myalgia, stiffness, Jt. Swelling, pain, limp or strain/sprain. Denies Falls. Skin: Denies puritis, rash, hives, warts, acne, eczema or change in skin lesion Neuro: No weakness, tremor, incoordination, spasms, paresthesia or pain Psychiatric: Denies confusion, memory loss or sensory loss. Denies Depression. Endocrine: Denies change in weight, skin, hair change, nocturia, and paresthesia, diabetic polys, visual blurring or hyper / hypo glycemic episodes.  Heme/Lymph: No excessive bleeding, bruising or enlarged lymph nodes.  Physical Exam  BP 128/76   Pulse 64   Temp (!) 97.5 F (36.4 C)   Resp 16   Ht 5\' 7"  (1.702 m)   Wt 220 lb 9.6 oz (100.1 kg)   BMI 34.55 kg/m   General Appearance: Over nourished and well groomed and in no apparent distress.  Eyes: PERRLA, EOMs, conjunctiva no swelling or erythema, normal fundi and vessels. Sinuses: No frontal/maxillary tenderness ENT/Mouth: EACs patent / TMs  nl. Nares clear without erythema, swelling,  mucoid exudates. Oral hygiene is good. No erythema, swelling, or exudate. Tongue normal, non-obstructing. Tonsils not swollen or erythematous. Hearing normal.  Neck: Supple, thyroid normal. No bruits, nodes or JVD. Respiratory: Respiratory effort normal.  BS equal and clear bilateral without rales, rhonci, wheezing or stridor. Cardio: Heart sounds are normal with regular rate and rhythm and no murmurs, rubs or gallops. Peripheral pulses are normal and equal bilaterally without edema. No aortic or femoral bruits. Chest: symmetric with normal excursions and percussion.  Abdomen: Soft, with Nl bowel sounds. Nontender, no guarding, rebound, hernias, masses, or organomegaly.  Lymphatics: Non tender without lymphadenopathy.  Genitourinary: No hernias.Testes nl. DRE - prostate nl for age - smooth & firm w/o nodules. Musculoskeletal: Full ROM all peripheral extremities, joint stability, 5/5 strength, and normal gait. Skin: Warm and dry without rashes, lesions, cyanosis, clubbing or  ecchymosis.  Neuro: Cranial nerves intact, reflexes equal bilaterally. Normal muscle tone, no cerebellar symptoms. Sensation intact.  Pysch: Alert and oriented X 3 with normal affect, insight and judgment appropriate.   Assessment and Plan  1. Annual Preventative/Screening Exam    2. Essential hypertension  - EKG 12-Lead - [place], RETROPERITNL ABD,  LTD - Urinalysis, Routine w reflex microscopic - Microalbumin / creatinine urine ratio - CBC with Differential/Platelet - BASIC METABOLIC PANEL WITH GFR - Magnesium - TSH  3. Hyperlipidemia, mixed  - EKG 12-Lead - [place], RETROPERITNL ABD,  LTD - Hepatic function panel - Lipid panel  4. Prediabetes  - EKG 12-Lead - [place], RETROPERITNL ABD,  LTD - Hemoglobin A1c - Insulin, random  5. Vitamin D deficiency  - VITAMIN D 25 Hydroxy   6. Hypothyroidism, unspecified type  - TSH  7. Morbid obesity due to excess calories ([place])   8. Idiopathic gout  - Uric  acid  9. Screening for rectal cancer  - POC Hemoccult Bld/Stl   10. Prostate cancer screening  - PSA  11. Screening for ischemic heart disease  - EKG 12-Lead  12. Screening for AAA (aortic abdominal aneurysm)  - [place], RETROPERITNL ABD,  LTD  13. Medication management  - Urinalysis, Routine w reflex microscopic - Microalbumin / creatinine urine ratio - CBC with Differential/Platelet - BASIC METABOLIC PANEL WITH GFR - Hepatic function panel - Magnesium - Lipid panel - TSH - Hemoglobin A1c - Insulin, random - VITAMIN D 25 Hydroxy  - Uric acid       Patient was counseled in prudent diet, weight control to achieve/maintain BMI less than 25, BP monitoring, regular exercise and medications as discussed.  Discussed med effects and SE's. Routine screening labs and tests as requested with regular follow-up as recommended. Over 40 minutes of exam, counseling, chart review and high complex critical decision making was performed

## 2016-12-13 NOTE — Patient Instructions (Signed)

## 2016-12-14 ENCOUNTER — Other Ambulatory Visit: Payer: Self-pay | Admitting: Internal Medicine

## 2016-12-14 ENCOUNTER — Encounter: Payer: Self-pay | Admitting: *Deleted

## 2016-12-14 ENCOUNTER — Telehealth: Payer: Self-pay | Admitting: *Deleted

## 2016-12-14 LAB — URINALYSIS, MICROSCOPIC ONLY
Bacteria, UA: NONE SEEN [HPF]
Casts: NONE SEEN [LPF]
Crystals: NONE SEEN [HPF]
Squamous Epithelial / LPF: NONE SEEN [HPF] (ref <=5)
YEAST: NONE SEEN [HPF]

## 2016-12-14 LAB — URINALYSIS, ROUTINE W REFLEX MICROSCOPIC
Bilirubin Urine: NEGATIVE
Glucose, UA: NEGATIVE
Hgb urine dipstick: NEGATIVE
Ketones, ur: NEGATIVE
LEUKOCYTES UA: NEGATIVE
NITRITE: NEGATIVE
Specific Gravity, Urine: 1.023 (ref 1.001–1.035)
pH: 6 (ref 5.0–8.0)

## 2016-12-14 LAB — MICROALBUMIN / CREATININE URINE RATIO
CREATININE, URINE: 241 mg/dL (ref 20–370)
MICROALB UR: 68.9 mg/dL
MICROALB/CREAT RATIO: 286 ug/mg{creat} — AB (ref <30)

## 2016-12-14 LAB — HEMOGLOBIN A1C
Hgb A1c MFr Bld: 4.7 % (ref <5.7)
MEAN PLASMA GLUCOSE: 88 mg/dL

## 2016-12-14 LAB — VITAMIN D 25 HYDROXY (VIT D DEFICIENCY, FRACTURES): VIT D 25 HYDROXY: 69 ng/mL (ref 30–100)

## 2016-12-14 LAB — URIC ACID: Uric Acid, Serum: 6 mg/dL (ref 4.0–8.0)

## 2016-12-14 LAB — PSA: PSA: 2.9 ng/mL (ref <=4.0)

## 2016-12-14 LAB — MAGNESIUM: MAGNESIUM: 1.8 mg/dL (ref 1.5–2.5)

## 2016-12-14 LAB — INSULIN, RANDOM: INSULIN: 17.5 u[IU]/mL (ref 2.0–19.6)

## 2016-12-14 MED ORDER — ROSUVASTATIN CALCIUM 40 MG PO TABS: ORAL_TABLET | ORAL | 1 refills | Status: DC

## 2016-12-14 NOTE — Telephone Encounter (Signed)
Patient called and states he was told on his AVS to take Vitamin D and Vitamin K together. Per Dr Melford Aase, advised the patient to stop taking Vitamin K due to the fact it can cause his blood to clot more easily.  Patient is aware and understands.

## 2016-12-26 ENCOUNTER — Other Ambulatory Visit: Payer: Self-pay | Admitting: *Deleted

## 2016-12-26 DIAGNOSIS — Z1212 Encounter for screening for malignant neoplasm of rectum: Secondary | ICD-10-CM

## 2016-12-26 LAB — POC HEMOCCULT BLD/STL (HOME/3-CARD/SCREEN)
Card #2 Fecal Occult Blod, POC: NEGATIVE
FECAL OCCULT BLD: NEGATIVE
FECAL OCCULT BLD: NEGATIVE

## 2017-01-03 ENCOUNTER — Other Ambulatory Visit: Payer: Self-pay | Admitting: Internal Medicine

## 2017-02-06 ENCOUNTER — Other Ambulatory Visit: Payer: Self-pay | Admitting: Internal Medicine

## 2017-02-25 ENCOUNTER — Other Ambulatory Visit: Payer: Self-pay | Admitting: Internal Medicine

## 2017-03-08 ENCOUNTER — Encounter: Payer: Self-pay | Admitting: Adult Health

## 2017-03-08 ENCOUNTER — Ambulatory Visit (INDEPENDENT_AMBULATORY_CARE_PROVIDER_SITE_OTHER): Payer: Medicare PPO | Admitting: Adult Health

## 2017-03-08 VITALS — BP 136/80 | HR 68 | Temp 97.3°F | Resp 18 | Ht 67.0 in | Wt 226.0 lb

## 2017-03-08 DIAGNOSIS — I10 Essential (primary) hypertension: Secondary | ICD-10-CM | POA: Diagnosis not present

## 2017-03-08 MED ORDER — LOSARTAN POTASSIUM 50 MG PO TABS: ORAL_TABLET | ORAL | 2 refills | Status: DC

## 2017-03-08 NOTE — Progress Notes (Signed)
Assessment and Plan:  Andrew Cochran was seen today for acute visit.  Diagnoses and all orders for this visit:  Essential hypertension -     losartan (COZAAR) 50 MG tablet; Start taking 50 mg daily, may go up to 100 mg in 2 weeks if BP remains above 140/90 -     Discussed we will add this medication on temporarily and anticipate being able to come off of once BP normalized from atenolol rebound hypertension.  -     Patient to check BP daily, call office in 1-2 weeks if continues to run high, or if becomes lower than normal -     Follow up in 4 weeks as scheduled -     Discussed to never stop taking atenolol or other medication abruptly without first consulting physician -     Discussed weight loss and exercise in length as essential towards controlling BP and reducing use of medications  Further disposition pending results of labs. Discussed med's effects and SE's.   Over 15 minutes of exam, counseling, chart review, and critical decision making was performed.   Future Appointments Date Time Provider [place]  04/10/2017 10:30 AM Vicie Mutters, PA-C [facility] None  07/12/2017 10:30 AM Unk Pinto, MD [facility] None  01/18/2018 10:00 AM Unk Pinto, MD [facility] None          ------------------------------------------------------------------------------------------------------------------   HPI BP 136/80   Pulse 68   Temp (!) 97.3 F (36.3 C)   Resp 18   Ht '5\' 7"'$  (1.702 m)   Wt 226 lb (102.5 kg)   SpO2 99%   BMI 35.40 kg/m   76 y.o.male presents for evaluation of blood pressure concerns after he stopped taking his atenolol 100 mg daily 3-4 weeks and started taking a "natural supplement" with cinnamon and B12 amongst other ingredients that he believed could replace his BP medication. He is back on the atenolol as of 2 weeks ago after noting an episode of extremely high systolic over 540- has since been running high, 190/91 this am, but not over 200/100.  Recheck by  provider in office shows 170/101 in office today.   Patient denies HA/dizziness, vision changes, weakness, CP, SOB, nausea, abdominal pain, lower extremity edema.    Past Medical History:  Diagnosis Date  . BPH with elevated PSA   . Cataracts, bilateral   . Diverticulosis   . Gout   . Hx of adenomatous colonic polyps 06/18/2014  . Hyperlipidemia   . Hypertension   . Prediabetes   . Thyroid disease   . Tubular adenoma 02/17/2003     Allergies  Allergen Reactions  . Lotensin [Benazepril Hcl]     unknown  . Penicillins Rash    Current Outpatient Prescriptions on File Prior to Visit  Medication Sig  . ACCU-CHEK FASTCLIX LANCETS MISC Check blood sugar 1 time daily-DX-R73.03  . allopurinol (ZYLOPRIM) 300 MG tablet TAKE 1 TABLET EVERY DAY  . Ascorbic Acid (VITAMIN C PO) Take 2,000 mg by mouth daily.   Marland Kitchen atenolol (TENORMIN) 100 MG tablet TAKE 1 TABLET EVERY DAY  . BABY ASPIRIN PO Take 81 mg by mouth daily.  . Blood Glucose Calibration (ACCU-CHEK SMARTVIEW CONTROL) LIQD Check blood sugar 1 time daily-DX-R73.03  . Blood Glucose Monitoring Suppl (ACCU-CHEK NANO SMARTVIEW) w/Device KIT Check blood sugar 1 time daily-DX-R73.03  . Cholecalciferol (VITAMIN D PO) Take 10,000 Int'l Units by mouth daily.  Marland Kitchen glucose blood (ACCU-CHEK SMARTVIEW) test strip Check blood sugar 1 time daily-DX-R73.03  . levothyroxine (SYNTHROID,  LEVOTHROID) 200 MCG tablet TAKE 1 AND 1/2 TABLETS EVERY DAY  . MAGNESIUM PO Take 250 mg by mouth 3 (three) times daily.   . Omega-3 Fatty Acids (FISH OIL PO) Take 1,000 mg by mouth daily.   . Red Yeast Rice Extract (RED YEAST RICE PO) Take 1,000 mg by mouth daily.   . rosuvastatin (CRESTOR) 40 MG tablet Take 1/2 to 1 tablet daily or as directed for Cholesterol  . triamcinolone cream (KENALOG) 0.1 % Apply 1 application topically 3 (three) times daily.   No current facility-administered medications on file prior to visit.     ROS: all negative except above.   Physical  Exam:  BP 136/80   Pulse 68   Temp (!) 97.3 F (36.3 C)   Resp 18   Ht 5' 7" (1.702 m)   Wt 226 lb (102.5 kg)   SpO2 99%   BMI 35.40 kg/m   General Appearance: Well nourished, obese male in no apparent distress. Eyes: PERRLA, conjunctiva no swelling or erythema ENT/Mouth: Ext aud canals clear, TMs without erythema, bulging. No erythema, swelling, or exudate on post pharynx.  Tonsils not swollen or erythematous. Hearing normal.  Neck: Supple, thyroid normal.  Respiratory: Respiratory effort normal, BS equal bilaterally without rales, rhonchi, wheezing or stridor.  Cardio: RRR with no MRGs. Brisk peripheral pulses without edema.  Abdomen: Soft, + BS.  Non tender, no guarding, rebound, hernias, masses. Lymphatics: Non tender without lymphadenopathy.  Musculoskeletal: Full ROM, 5/5 strength, normal gait.  Skin: Warm, dry without rashes, lesions, ecchymosis.  Neuro: Cranial nerves intact. Normal muscle tone, no cerebellar symptoms. Sensation intact.  Psych: Awake and oriented X 3, normal affect, Insight and Judgment appropriate.     Izora Ribas, NP 2:32 PM [facility]

## 2017-03-08 NOTE — Patient Instructions (Addendum)
Start the losartan 50 mg daily- check BP daily, and if not below 140/90, go up to 100 mg (2 tabs daily)  Start walking daily, start with 15 minutes, work up to 30-45 min five times a week.  Consider losing some weight. Watch your sodium intake and keep as low as possible- minimal processed/packaged foods other than plain frozen fruits or vegetables.   Hypertension Hypertension, commonly called high blood pressure, is when the force of blood pumping through the arteries is too strong. The arteries are the blood vessels that carry blood from the heart throughout the body. Hypertension forces the heart to work harder to pump blood and may cause arteries to become narrow or stiff. Having untreated or uncontrolled hypertension can cause heart attacks, strokes, kidney disease, and other problems. A blood pressure reading consists of a higher number over a lower number. Ideally, your blood pressure should be below 120/80. The first ("top") number is called the systolic pressure. It is a measure of the pressure in your arteries as your heart beats. The second ("bottom") number is called the diastolic pressure. It is a measure of the pressure in your arteries as the heart relaxes. What are the causes? The cause of this condition is not known. What increases the risk? Some risk factors for high blood pressure are under your control. Others are not. Factors you can change  Smoking.  Having type 2 diabetes mellitus, high cholesterol, or both.  Not getting enough exercise or physical activity.  Being overweight.  Having too much fat, sugar, calories, or salt (sodium) in your diet.  Drinking too much alcohol. Factors that are difficult or impossible to change  Having chronic kidney disease.  Having a family history of high blood pressure.  Age. Risk increases with age.  Race. You may be at higher risk if you are African-American.  Gender. Men are at higher risk than women before age 79. After  age 5, women are at higher risk than men.  Having obstructive sleep apnea.  Stress. What are the signs or symptoms? Extremely high blood pressure (hypertensive crisis) may cause:  Headache.  Anxiety.  Shortness of breath.  Nosebleed.  Nausea and vomiting.  Severe chest pain.  Jerky movements you cannot control (seizures).  How is this diagnosed? This condition is diagnosed by measuring your blood pressure while you are seated, with your arm resting on a surface. The cuff of the blood pressure monitor will be placed directly against the skin of your upper arm at the level of your heart. It should be measured at least twice using the same arm. Certain conditions can cause a difference in blood pressure between your right and left arms. Certain factors can cause blood pressure readings to be lower or higher than normal (elevated) for a short period of time:  When your blood pressure is higher when you are in a health care provider's office than when you are at home, this is called white coat hypertension. Most people with this condition do not need medicines.  When your blood pressure is higher at home than when you are in a health care provider's office, this is called masked hypertension. Most people with this condition may need medicines to control blood pressure.  If you have a high blood pressure reading during one visit or you have normal blood pressure with other risk factors:  You may be asked to return on a different day to have your blood pressure checked again.  You may be asked  to monitor your blood pressure at home for 1 week or longer.  If you are diagnosed with hypertension, you may have other blood or imaging tests to help your health care provider understand your overall risk for other conditions. How is this treated? This condition is treated by making healthy lifestyle changes, such as eating healthy foods, exercising more, and reducing your alcohol intake. Your  health care provider may prescribe medicine if lifestyle changes are not enough to get your blood pressure under control, and if:  Your systolic blood pressure is above 130.  Your diastolic blood pressure is above 80.  Your personal target blood pressure may vary depending on your medical conditions, your age, and other factors. Follow these instructions at home: Eating and drinking  Eat a diet that is high in fiber and potassium, and low in sodium, added sugar, and fat. An example eating plan is called the DASH (Dietary Approaches to Stop Hypertension) diet. To eat this way: ? Eat plenty of fresh fruits and vegetables. Try to fill half of your plate at each meal with fruits and vegetables. ? Eat whole grains, such as whole wheat pasta, brown rice, or whole grain bread. Fill about one quarter of your plate with whole grains. ? Eat or drink low-fat dairy products, such as skim milk or low-fat yogurt. ? Avoid fatty cuts of meat, processed or cured meats, and poultry with skin. Fill about one quarter of your plate with lean proteins, such as fish, chicken without skin, beans, eggs, and tofu. ? Avoid premade and processed foods. These tend to be higher in sodium, added sugar, and fat.  Reduce your daily sodium intake. Most people with hypertension should eat less than 1,500 mg of sodium a day.  Limit alcohol intake to no more than 1 drink a day for nonpregnant women and 2 drinks a day for men. One drink equals 12 oz of beer, 5 oz of wine, or 1 oz of hard liquor. Lifestyle  Work with your health care provider to maintain a healthy body weight or to lose weight. Ask what an ideal weight is for you.  Get at least 30 minutes of exercise that causes your heart to beat faster (aerobic exercise) most days of the week. Activities may include walking, swimming, or biking.  Include exercise to strengthen your muscles (resistance exercise), such as pilates or lifting weights, as part of your weekly  exercise routine. Try to do these types of exercises for 30 minutes at least 3 days a week.  Do not use any products that contain nicotine or tobacco, such as cigarettes and e-cigarettes. If you need help quitting, ask your health care provider.  Monitor your blood pressure at home as told by your health care provider.  Keep all follow-up visits as told by your health care provider. This is important. Medicines  Take over-the-counter and prescription medicines only as told by your health care provider. Follow directions carefully. Blood pressure medicines must be taken as prescribed.  Do not skip doses of blood pressure medicine. Doing this puts you at risk for problems and can make the medicine less effective.  Ask your health care provider about side effects or reactions to medicines that you should watch for. Contact a health care provider if:  You think you are having a reaction to a medicine you are taking.  You have headaches that keep coming back (recurring).  You feel dizzy.  You have swelling in your ankles.  You have trouble with  your vision. Get help right away if:  You develop a severe headache or confusion.  You have unusual weakness or numbness.  You feel faint.  You have severe pain in your chest or abdomen.  You vomit repeatedly.  You have trouble breathing. Summary  Hypertension is when the force of blood pumping through your arteries is too strong. If this condition is not controlled, it may put you at risk for serious complications.  Your personal target blood pressure may vary depending on your medical conditions, your age, and other factors. For most people, a normal blood pressure is less than 120/80.  Hypertension is treated with lifestyle changes, medicines, or a combination of both. Lifestyle changes include weight loss, eating a healthy, low-sodium diet, exercising more, and limiting alcohol. This information is not intended to replace advice  given to you by your health care provider. Make sure you discuss any questions you have with your health care provider. Document Released: 05/15/2005 Document Revised: 04/12/2016 Document Reviewed: 04/12/2016 Elsevier Interactive Patient Education  [employer].

## 2017-04-10 ENCOUNTER — Encounter: Payer: Self-pay | Admitting: Physician Assistant

## 2017-04-10 ENCOUNTER — Ambulatory Visit (INDEPENDENT_AMBULATORY_CARE_PROVIDER_SITE_OTHER): Payer: Medicare PPO | Admitting: Physician Assistant

## 2017-04-10 VITALS — BP 122/86 | HR 64 | Temp 97.0°F | Resp 18 | Ht 67.0 in | Wt 229.2 lb

## 2017-04-10 DIAGNOSIS — R7303 Prediabetes: Secondary | ICD-10-CM

## 2017-04-10 DIAGNOSIS — Z86010 Personal history of colonic polyps: Secondary | ICD-10-CM

## 2017-04-10 DIAGNOSIS — M150 Primary generalized (osteo)arthritis: Secondary | ICD-10-CM

## 2017-04-10 DIAGNOSIS — Z7189 Other specified counseling: Secondary | ICD-10-CM

## 2017-04-10 DIAGNOSIS — M1000 Idiopathic gout, unspecified site: Secondary | ICD-10-CM | POA: Diagnosis not present

## 2017-04-10 DIAGNOSIS — H269 Unspecified cataract: Secondary | ICD-10-CM | POA: Diagnosis not present

## 2017-04-10 DIAGNOSIS — N400 Enlarged prostate without lower urinary tract symptoms: Secondary | ICD-10-CM | POA: Diagnosis not present

## 2017-04-10 DIAGNOSIS — M159 Polyosteoarthritis, unspecified: Secondary | ICD-10-CM

## 2017-04-10 DIAGNOSIS — E782 Mixed hyperlipidemia: Secondary | ICD-10-CM

## 2017-04-10 DIAGNOSIS — Z0000 Encounter for general adult medical examination without abnormal findings: Secondary | ICD-10-CM

## 2017-04-10 DIAGNOSIS — E559 Vitamin D deficiency, unspecified: Secondary | ICD-10-CM

## 2017-04-10 DIAGNOSIS — Z0001 Encounter for general adult medical examination with abnormal findings: Secondary | ICD-10-CM

## 2017-04-10 DIAGNOSIS — I700 Atherosclerosis of aorta: Secondary | ICD-10-CM | POA: Insufficient documentation

## 2017-04-10 DIAGNOSIS — I10 Essential (primary) hypertension: Secondary | ICD-10-CM | POA: Diagnosis not present

## 2017-04-10 DIAGNOSIS — Z79899 Other long term (current) drug therapy: Secondary | ICD-10-CM | POA: Diagnosis not present

## 2017-04-10 DIAGNOSIS — M353 Polymyalgia rheumatica: Secondary | ICD-10-CM | POA: Diagnosis not present

## 2017-04-10 DIAGNOSIS — R972 Elevated prostate specific antigen [PSA]: Secondary | ICD-10-CM

## 2017-04-10 DIAGNOSIS — E039 Hypothyroidism, unspecified: Secondary | ICD-10-CM

## 2017-04-10 DIAGNOSIS — Z6835 Body mass index (BMI) 35.0-35.9, adult: Secondary | ICD-10-CM

## 2017-04-10 DIAGNOSIS — R6889 Other general symptoms and signs: Secondary | ICD-10-CM | POA: Diagnosis not present

## 2017-04-10 LAB — CBC WITH DIFFERENTIAL/PLATELET
BASOS ABS: 50 {cells}/uL (ref 0–200)
Basophils Relative: 0.7 %
EOS ABS: 317 {cells}/uL (ref 15–500)
Eosinophils Relative: 4.4 %
HCT: 40.1 % (ref 38.5–50.0)
Hemoglobin: 14.3 g/dL (ref 13.2–17.1)
Lymphs Abs: 1498 cells/uL (ref 850–3900)
MCH: 31.2 pg (ref 27.0–33.0)
MCHC: 35.7 g/dL (ref 32.0–36.0)
MCV: 87.6 fL (ref 80.0–100.0)
MONOS PCT: 9.7 %
MPV: 10.3 fL (ref 7.5–12.5)
NEUTROS ABS: 4637 {cells}/uL (ref 1500–7800)
Neutrophils Relative %: 64.4 %
PLATELETS: 212 10*3/uL (ref 140–400)
RBC: 4.58 10*6/uL (ref 4.20–5.80)
RDW: 13.1 % (ref 11.0–15.0)
Total Lymphocyte: 20.8 %
WBC: 7.2 10*3/uL (ref 3.8–10.8)
WBCMIX: 698 {cells}/uL (ref 200–950)

## 2017-04-10 LAB — HEPATIC FUNCTION PANEL
AG Ratio: 1.4 (calc) (ref 1.0–2.5)
ALBUMIN MSPROF: 3.9 g/dL (ref 3.6–5.1)
ALKALINE PHOSPHATASE (APISO): 100 U/L (ref 40–115)
ALT: 21 U/L (ref 9–46)
AST: 22 U/L (ref 10–35)
BILIRUBIN DIRECT: 0.2 mg/dL (ref 0.0–0.2)
BILIRUBIN INDIRECT: 0.6 mg/dL (ref 0.2–1.2)
BILIRUBIN TOTAL: 0.8 mg/dL (ref 0.2–1.2)
Globulin: 2.7 g/dL (calc) (ref 1.9–3.7)
Total Protein: 6.6 g/dL (ref 6.1–8.1)

## 2017-04-10 LAB — LIPID PANEL
Cholesterol: 209 mg/dL — ABNORMAL HIGH (ref <200)
HDL: 48 mg/dL (ref >40)
LDL Cholesterol (Calc): 145 mg/dL (calc) — ABNORMAL HIGH
NON-HDL CHOLESTEROL (CALC): 161 mg/dL — AB (ref <130)
Total CHOL/HDL Ratio: 4.4 (calc) (ref <5.0)
Triglycerides: 65 mg/dL (ref <150)

## 2017-04-10 LAB — BASIC METABOLIC PANEL WITH GFR
BUN: 14 mg/dL (ref 7–25)
CALCIUM: 9.2 mg/dL (ref 8.6–10.3)
CHLORIDE: 103 mmol/L (ref 98–110)
CO2: 31 mmol/L (ref 20–32)
Creat: 0.72 mg/dL (ref 0.70–1.18)
GFR, EST AFRICAN AMERICAN: 107 mL/min/{1.73_m2} (ref >=60)
GFR, EST NON AFRICAN AMERICAN: 92 mL/min/{1.73_m2} (ref >=60)
Glucose, Bld: 106 mg/dL — ABNORMAL HIGH (ref 65–99)
POTASSIUM: 4.3 mmol/L (ref 3.5–5.3)
SODIUM: 138 mmol/L (ref 135–146)

## 2017-04-10 LAB — MAGNESIUM: MAGNESIUM: 1.7 mg/dL (ref 1.5–2.5)

## 2017-04-10 LAB — TSH: TSH: 0.33 m[IU]/L — AB (ref 0.40–4.50)

## 2017-04-10 NOTE — Progress Notes (Signed)
MEDICARE ANNUAL WELLNESS VISIT AND FOLLOW UP Assessment:   Essential hypertension - continue medications, DASH diet, exercise and monitor at home. Call if greater than 130/80.  -     CBC with Differential/Platelet -     BASIC METABOLIC PANEL WITH GFR -     Hepatic function panel -     TSH  Hypothyroidism, unspecified type Hypothyroidism-check TSH level, continue medications the same, reminded to take on an empty stomach 30-86mns before food.  -     TSH  Mixed hyperlipidemia -continue medications, check lipids, decrease fatty foods, increase activity.  -     Lipid panel  PMR (2011) Monitor symptoms  Medication management -     Magnesium  Prediabetes Discussed disease progression and risks Discussed diet/exercise, weight management and risk modification A1C  Vitamin D deficiency Continue supplement  Hx of adenomatous colonic polyps UTD  Idiopathic gout, unspecified chronicity, unspecified site Gout- recheck Uric acid as needed, Diet discussed, continue medications.  BPH with elevated PSA Monitor  Cataract of both eyes, unspecified cataract type Monitor  Primary osteoarthritis involving multiple joints Monitor  Encounter for Medicare annual wellness exam  Aortic atherosclerosis ([place] Control blood pressure, cholesterol, glucose, increase exercise.   BMI 35.0-35.9,adult - follow up 3 months - long discussion about weight loss, diet, and exercise -recommended diet heavy in fruits and veggies and low in animal meats, cheeses, and dairy products  Advanced care planning/counseling discussion Discussed advanced care planning with patient  At least 30 mins discussed with the patient   Future Appointments  Date Time Provider [place] 07/12/2017 10:30 AM Andrew Pinto MD [facility] None  01/18/2018 10:00 AM Andrew Pinto MD [facility] None     Plan:   During the course of the visit the patient was educated and counseled about appropriate  screening and preventive services including:    Pneumococcal vaccine   Influenza vaccine  Td vaccine  Screening electrocardiogram  Colorectal cancer screening  Diabetes screening  Glaucoma screening  Nutrition counseling    Subjective:  Andrew FITZSIMMONSis a 76y.o. male who presents for Medicare Annual Wellness Visit and 3 month follow up for HTN, hyperlipidemia, prediabetes, and vitamin D Def.   His blood pressure has been controlled at home, today their BP is BP: 122/86 He does not workout. He denies chest pain, shortness of breath, dizziness.  He has history of nonfocal TIA in 2008, no sequela.  He does studies, he is in urology study now and he has dementia study at the end of the month.  He has history of PMR x 2011, has been tapered off steroids, does take rarely He is not on cholesterol medication and denies myalgias. His cholesterol is not at goal. The cholesterol last visit was:   Lab Results  Component Value Date   CHOL 223 (H) 12/13/2016   HDL 45 12/13/2016   LDLCALC 157 (H) 12/13/2016   TRIG 104 12/13/2016   CHOLHDL 5.0 (H) 12/13/2016   Last A1C in the office was normal, did have A1C of 6.4 in 2011 but has decreased with exercise/diet:  Lab Results  Component Value Date   HGBA1C 4.7 12/13/2016   Patient is on Vitamin D supplement.   Lab Results  Component Value Date   VD25OH 69 12/13/2016     He is on thyroid medication. His medication was not changed last visit. Patient denies nervousness, palpitations and weight changes.  Lab Results  Component Value Date   TSH 3.86 12/13/2016  .  Patient is on allopurinol for gout and does not report a recent flare.  He has 3 kids, 5 grand kids and 1 great grand kid.  BMI is Body mass index is 35.9 kg/m., he is working on diet and exercise. Wt Readings from Last 3 Encounters:  04/10/17 229 lb 3.2 oz (104 kg)  03/08/17 226 lb (102.5 kg)  12/13/16 220 lb 9.6 oz (100.1 kg)   Patient is on allopurinol for gout  and does not report a recent flare, last flare 6 months ago.  Lab Results  Component Value Date   LABURIC 6.0 12/13/2016    Names of Other Physician/Practitioners you currently use: 1. [facility] here for primary care 2. Dr. Katy Fitch, eye doctor, Jan 2015, over due 3. [facility]. Pearson For[facility] at [facility], dentist, 2017 Patient Care Team: Unk Pinto, MD as PCP - General (Internal Medicine) [place], [facility] Andrew Osier., MD as Attending Physician (Urology) Gatha Mayer, MD as Consulting Physician (Gastroenterology) Clent Jacks, MD as Consulting Physician (Ophthalmology)  Medication Review: Current Outpatient Medications on File Prior to Visit  Medication Sig Dispense Refill  . ACCU-CHEK FASTCLIX LANCETS MISC Check blood sugar 1 time daily-DX-R73.03 102 each 3  . allopurinol (ZYLOPRIM) 300 MG tablet TAKE 1 TABLET EVERY DAY 90 tablet 1  . Ascorbic Acid (VITAMIN C PO) Take 2,000 mg by mouth daily.     Marland Kitchen atenolol (TENORMIN) 100 MG tablet TAKE 1 TABLET EVERY DAY 90 tablet 1  . BABY ASPIRIN PO Take 81 mg by mouth daily.    . Blood Glucose Calibration (ACCU-CHEK SMARTVIEW CONTROL) LIQD Check blood sugar 1 time daily-DX-R73.03 1 each 3  . Blood Glucose Monitoring Suppl (ACCU-CHEK NANO SMARTVIEW) w/Device KIT Check blood sugar 1 time daily-DX-R73.03 1 kit 0  . Cholecalciferol (VITAMIN D PO) Take 10,000 Int'l Units by mouth daily.    Marland Kitchen glucose blood (ACCU-CHEK SMARTVIEW) test strip Check blood sugar 1 time daily-DX-R73.03 100 each 3  . levothyroxine (SYNTHROID, LEVOTHROID) 200 MCG tablet TAKE 1 AND 1/2 TABLETS EVERY DAY 135 tablet 1  . losartan (COZAAR) 50 MG tablet Start taking 50 mg daily, may go up to 100 mg in 2 weeks if BP remains above 140/90 60 tablet 2  . MAGNESIUM PO Take 250 mg by mouth 3 (three) times daily.     . Omega-3 Fatty Acids (FISH OIL PO) Take 1,000 mg by mouth daily.     . Red Yeast Rice Extract (RED YEAST RICE PO) Take 1,000 mg by  mouth daily.     . rosuvastatin (CRESTOR) 40 MG tablet Take 1/2 to 1 tablet daily or as directed for Cholesterol 90 tablet 1  . triamcinolone cream (KENALOG) 0.1 % Apply 1 application topically 3 (three) times daily. 30 g 0   No current facility-administered medications on file prior to visit.     Current Problems (verified) Patient Active Problem List   Diagnosis Date Noted  . Aortic atherosclerosis ([place]) 04/10/2017  . Encounter for Medicare annual wellness exam 08/02/2015  . BMI 30.0-30.9,adult 05/01/2015  . DJD  07/07/2014  . PMR (2011) 07/07/2014  . Hx of adenomatous colonic polyps 06/18/2014  . Prediabetes 09/25/2013  . Vitamin D deficiency 09/25/2013  . Medication management 09/25/2013  . Cataracts, bilateral   . Gout   . Hypertension   . Hypothyroidism   . BPH with elevated PSA   . Hyperlipidemia      Immunization History  Administered Date(s) Administered  . Pneumococcal Polysaccharide-23 08/19/2008  .  Td 09/07/2009    Preventative care: Last colonoscopy: 05/2014 Dr. Carlean Purl, + adenomas 5 years [place] AB 2012 US neck 2014 CXR 10/2013 CT head 10/2003  Prior vaccinations: TD or Tdap: 2011  Influenza: declines Pneumococcal: 2010 Prevnar 13: declines Shingles/Zostavax: declines  Allergies Allergies  Allergen Reactions  . Lotensin [Benazepril Hcl]     unknown  . Penicillins Rash    SURGICAL HISTORY He  has a past surgical history that includes Cataract extraction (Right, 2009); Shoulder arthroscopy (Right); and Colonoscopy w/ biopsies. FAMILY HISTORY His family history includes COPD in his father; Cancer in his mother. SOCIAL HISTORY He  reports that he quit smoking about 57 years ago. he has never used smokeless tobacco. He reports that he does not drink alcohol or use drugs.  MEDICARE WELLNESS OBJECTIVES: Physical activity: Current Exercise Habits: The patient does not participate in regular exercise at present Cardiac risk factors: Cardiac Risk  Factors include: advanced age (>79mn, >>13women);obesity (BMI >30kg/m2);sedentary lifestyle;male gender;hypertension;dyslipidemia Depression/mood screen:   Depression screen [facility] 04/10/2017  Decreased Interest 0  Down, Depressed, Hopeless 0  PHQ - 2 Score 0    ADLs:  In your present state of health, do you have any difficulty performing the following activities: 04/10/2017 12/13/2016  Hearing? Andrew Andrew  Vision? Andrew Andrew  Difficulty concentrating or making decisions? Andrew Andrew  Walking or climbing stairs? Andrew Andrew  Dressing or bathing? Andrew Andrew  Doing errands, shopping? Andrew Andrew  Some recent data might be hidden    Cognitive Testing  Alert? Yes  Normal Appearance?Yes  Oriented to person? Yes  Place? Yes   Time? Yes  Recall of three objects?  Yes  Can perform simple calculations? Yes  Displays appropriate judgment?Yes  Can read the correct time from a watch face?Yes  EOL planning: Does Patient Have a Medical Advance Directive?: No Would patient like information on creating a medical advance directive?: No - Patient declined He does not have advanced directives, states wife will make the decision, if she passes will fill them out so 3 kids does not have to make choice  Objective:   Blood pressure 122/86, pulse 64, temperature (!) 97 F (36.1 C), resp. rate 18, height '5\' 7"'  (1.702 m), weight 229 lb 3.2 oz (104 kg), SpO2 98 %. Body mass index is 35.9 kg/m.  General appearance: alert, no distress, WD/WN, male HEENT: normocephalic, sclerae anicteric, TMs pearly, nares patent, no discharge or erythema, pharynx normal Oral cavity: MMM, no lesions Neck: supple, no lymphadenopathy, no thyromegaly, no masses Heart: RRR, normal S1, S2, no murmurs Lungs: CTA bilaterally, no wheezes, rhonchi, or rales Abdomen: +bs, soft, non tender, non distended, no masses, no hepatomegaly, no splenomegaly Musculoskeletal: nontender, no swelling, no obvious deformity Extremities: no edema, no cyanosis, no clubbing Pulses: 2+  symmetric, upper and lower extremities, normal cap refill Neurological: alert, oriented x 3, CN2-12 intact, strength normal upper extremities and lower extremities, sensation normal throughout, DTRs 2+ throughout, no cerebellar signs, gait normal Skin: Has several scaly silver patches on legs, has non healing tender nodule right lateral ankle Psychiatric: normal affect, behavior normal, pleasant   Medicare Attestation I have personally reviewed: The patient's medical and social history Their use of alcohol, tobacco or illicit drugs Their current medications and supplements The patient's functional ability including ADLs,fall risks, home safety risks, cognitive, and hearing and visual impairment Diet and physical activities Evidence for depression or mood disorders  The patient's weight, height, BMI, and visual acuity have been recorded in  the chart.  I have made referrals, counseling, and provided education to the patient based on review of the above and I have provided the patient with a written personalized care plan for preventive services.     Vicie Mutters, PA-C   04/10/2017

## 2017-04-10 NOTE — Patient Instructions (Signed)
If cholesterol is still elevated, was 159 for your LDL last visit, please consider starting on Zetia  Your LDL is not in range or at goal.  Your LDL is the bad cholesterol that can lead to heart attack and stroke. To lower your number you can decrease your fatty foods, red meat, cheese, milk and increase fiber like whole grains and veggies. You can also add a fiber supplement like Citracel or Benefiber, these do not cause gas and bloating and are safe to use.

## 2017-04-11 NOTE — Progress Notes (Signed)
Pt aware of lab results & voiced understanding of those results. Pt states he is willing to start [place].

## 2017-04-12 MED ORDER — EZETIMIBE 10 MG PO TABS: 10.0000 mg | ORAL_TABLET | Freq: Every day | ORAL | 4 refills | Status: DC

## 2017-04-12 NOTE — Addendum Note (Signed)
Addended by: Vicie Mutters R on: 04/12/2017 07:49 AM   Modules accepted: Orders

## 2017-06-25 ENCOUNTER — Other Ambulatory Visit: Payer: Self-pay | Admitting: Internal Medicine

## 2017-06-25 ENCOUNTER — Other Ambulatory Visit: Payer: Self-pay | Admitting: Physician Assistant

## 2017-06-28 ENCOUNTER — Encounter: Payer: Self-pay | Admitting: Internal Medicine

## 2017-07-09 ENCOUNTER — Other Ambulatory Visit: Payer: Self-pay | Admitting: Internal Medicine

## 2017-07-11 ENCOUNTER — Other Ambulatory Visit: Payer: Self-pay | Admitting: Internal Medicine

## 2017-07-11 NOTE — Progress Notes (Signed)
This very nice 77 y.o. MWM presents for 6 month follow up with Hypertension, Hyperlipidemia, Pre-Diabetes, Gout  and Vitamin D Deficiency. Patient reports no Gout flares since stopping alcohol. Patient also has hx/o PMR. He also continues to c/o generalized peripheral joint pains felt conc=sistant with DJD.      Patient is treated for HTN (1997)  & BP has been controlled at home. Today's BP is at goal - 136/84. Patient has had no complaints of any cardiac type chest pain, palpitations, dyspnea / orthopnea / PND, dizziness, claudication, or dependent edema.     Hyperlipidemia is controlled with diet & meds. Patient denies myalgias or other med SE's. Last Lipids were not at goal: Lab Results  Component Value Date   CHOL 212 (H) 07/12/2017   HDL 41 07/12/2017   LDLCALC 157 (H) 12/13/2016   TRIG 115 07/12/2017   CHOLHDL 5.2 (H) 07/12/2017      Also, the patient has history of  Morbid Obesity (BMI 37+) and consequent PreDiabetes (A1c 6.4% / 2011)  and has had no symptoms of reactive hypoglycemia, diabetic polys, paresthesias or visual blurring.  Last A1c was Normal & at goal but patient has gained 18# in the last 6 months. : Lab Results  Component Value Date   HGBA1C 4.7 12/13/2016      Patient was initiated on Thyroid Replacement in 2002. Further, the patient also has history of Vitamin D Deficiency ("36"/2008) and supplements vitamin D without any suspected side-effects. Last vitamin D was at goal: Lab Results  Component Value Date   VD25OH 69 12/13/2016   Current Outpatient Medications on File Prior to Visit  Medication Sig  . ACCU-CHEK FASTCLIX LANCETS MISC CHECK  BLOOD  SUGAR ONE TIME DAILY  . ACCU-CHEK SMARTVIEW test strip CHECK  BLOOD  SUGAR ONE TIME DAILY  . allopurinol (ZYLOPRIM) 300 MG tablet TAKE 1 TABLET EVERY DAY  . Ascorbic Acid (VITAMIN C PO) Take 2,000 mg by mouth daily.   Marland Kitchen atenolol (TENORMIN) 100 MG tablet TAKE 1 TABLET EVERY DAY  . BABY ASPIRIN PO Take 81 mg by  mouth daily.  . Blood Glucose Calibration (ACCU-CHEK SMARTVIEW CONTROL) LIQD Check blood sugar 1 time daily-DX-R73.03  . Blood Glucose Monitoring Suppl (ACCU-CHEK NANO SMARTVIEW) w/Device KIT Check blood sugar 1 time daily-DX-R73.03  . Cholecalciferol (VITAMIN D PO) Take 10,000 Int'l Units by mouth daily.  Marland Kitchen ezetimibe (ZETIA) 10 MG tablet Take 1 tablet (10 mg total) daily by mouth.  . levothyroxine (SYNTHROID, LEVOTHROID) 200 MCG tablet TAKE 1 AND 1/2 TABLETS EVERY DAY  . MAGNESIUM PO Take 250 mg by mouth 3 (three) times daily.   . Omega-3 Fatty Acids (FISH OIL PO) Take 1,000 mg by mouth daily.   . predniSONE (DELTASONE) 20 MG tablet Only take 1/2 to 1 tablet / day and try to skip days - Rx should last 4 to 6 weeks !  . Red Yeast Rice Extract (RED YEAST RICE PO) Take 1,000 mg by mouth daily.   Marland Kitchen triamcinolone cream (KENALOG) 0.1 % Apply 1 application topically 3 (three) times daily.  Marland Kitchen losartan (COZAAR) 50 MG tablet Start taking 50 mg daily, may go up to 100 mg in 2 weeks if BP remains above 140/90 (Patient not taking: Reported on 07/12/2017)  . rosuvastatin (CRESTOR) 40 MG tablet Take 1/2 to 1 tablet daily or as directed for Cholesterol   No current facility-administered medications on file prior to visit.    Allergies  Allergen Reactions  .  Lotensin [Benazepril Hcl]     unknown  . Penicillins Rash   PMHx:   Past Medical History:  Diagnosis Date  . BPH with elevated PSA   . Cataracts, bilateral   . Diverticulosis   . Gout   . Hx of adenomatous colonic polyps 06/18/2014  . Hyperlipidemia   . Hypertension   . Prediabetes   . Thyroid disease   . Tubular adenoma 02/17/2003  . Umbilical hernia 95/62/1308   Immunization History  Administered Date(s) Administered  . Pneumococcal Polysaccharide-23 08/19/2008  . Td 09/07/2009   Past Surgical History:  Procedure Laterality Date  . CATARACT EXTRACTION Right 2009  . COLONOSCOPY W/ BIOPSIES    . SHOULDER ARTHROSCOPY Right    1979    FHx:    Reviewed / unchanged  SHx:    Reviewed / unchanged  Systems Review:  Constitutional: Denies fever, chills, wt changes, headaches, insomnia, fatigue, night sweats, change in appetite. Eyes: Denies redness, blurred vision, diplopia, discharge, itchy, watery eyes.  ENT: Denies discharge, congestion, post nasal drip, epistaxis, sore throat, earache, hearing loss, dental pain, tinnitus, vertigo, sinus pain, snoring.  CV: Denies chest pain, palpitations, irregular heartbeat, syncope, dyspnea, diaphoresis, orthopnea, PND, claudication or edema. Respiratory: denies cough, dyspnea, DOE, pleurisy, hoarseness, laryngitis, wheezing.  Gastrointestinal: Denies dysphagia, odynophagia, heartburn, reflux, water brash, abdominal pain or cramps, nausea, vomiting, bloating, diarrhea, constipation, hematemesis, melena, hematochezia  or hemorrhoids. Genitourinary: Denies dysuria, frequency, urgency, nocturia, hesitancy, discharge, hematuria or flank pain. Musculoskeletal: Denies arthralgias, myalgias, stiffness, jt. swelling, pain, limping or strain/sprain.  Skin: Denies pruritus, rash, hives, warts, acne, eczema or change in skin lesion(s). Neuro: No weakness, tremor, incoordination, spasms, paresthesia or pain. Psychiatric: Denies confusion, memory loss or sensory loss. Endo: Denies change in weight, skin or hair change.  Heme/Lymph: No excessive bleeding, bruising or enlarged lymph nodes.  Physical Exam  BP 136/84   Pulse 68   Temp (!) 97.3 F (36.3 C)   Resp 18   Ht _0  (1.702 m)   Wt 238 lb 3.2 oz (108 kg)   BMI 37.31 kg/m   Appears well nourished, well groomed  and in no distress.  Eyes: PERRLA, EOMs, conjunctiva no swelling or erythema. Sinuses: No frontal/maxillary tenderness ENT/Mouth: EAC's clear, TM's nl w/o erythema, bulging. Nares clear w/o erythema, swelling, exudates. Oropharynx clear without erythema or exudates. Oral hygiene is good. Tongue normal, non obstructing. Hearing  intact.  Neck: Supple. Thyroid not palpable. Car 2+/2+ without bruits, nodes or JVD. Chest: Respirations nl with BS clear & equal w/o rales, rhonchi, wheezing or stridor.  Cor: Heart sounds normal w/ regular rate and rhythm without sig. murmurs, gallops, clicks or rubs. Peripheral pulses normal and equal  without edema.  Abdomen: Soft & bowel sounds normal. Non-tender w/o guarding, rebound, hernias, masses or organomegaly.  Lymphatics: Unremarkable.  Musculoskeletal: Full ROM all peripheral extremities, joint stability, 5/5 strength and normal gait.  Skin: Warm, dry without exposed rashes, lesions or ecchymosis apparent.  Neuro: Cranial nerves intact, reflexes equal bilaterally. Sensory-motor testing grossly intact. Tendon reflexes grossly intact.  Pysch: Alert & oriented x 3.  Insight and judgement nl & appropriate. No ideations.  Assessment and Plan:  1. Essential hypertension  - Continue medication, monitor blood pressure at home.  - Continue DASH diet. Reminder to go to the ER if any CP,  SOB, nausea, dizziness, severe HA, changes vision/speech.  - CBC with Differential/Platelet - BASIC METABOLIC PANEL WITH GFR - Magnesium - TSH  2. Hyperlipidemia, mixed  -  Continue diet/meds, exercise,& lifestyle modifications.  - Continue monitor periodic cholesterol/liver & renal functions   - Hepatic function panel - Lipid panel - TSH  3. Prediabetes  - Continue diet, exercise, lifestyle modifications.  - Monitor appropriate labs. - Hemoglobin A1c - Insulin, random  4. Vitamin D deficiency  - Continue supplementation.  - VITAMIN D 25 Hydroxy  5. Abnormal glucose  - Hemoglobin A1c - Insulin, random  6. Idiopathic gout, hx  - Uric acid  7. Hypothyroidism  - TSH  8. Primary osteoarthritis involving multiple joints  9. PMR (2011), hx  - Sedimentation rate  10. Medication management  - CBC with Differential/Platelet - BASIC METABOLIC PANEL WITH GFR - Hepatic  function panel - Magnesium - Lipid panel - TSH - Hemoglobin A1c - Insulin, random - VITAMIN D 25 Hydroxy  - Uric acid - Sedimentation rate        Discussed  regular exercise, BP monitoring, weight control to achieve/maintain BMI less than 25 and discussed med and SE's. Recommended labs to assess and monitor clinical status with further disposition pending results of labs. Over 30 minutes of exam, counseling, chart review was performed.

## 2017-07-11 NOTE — Patient Instructions (Signed)

## 2017-07-12 ENCOUNTER — Encounter: Payer: Self-pay | Admitting: Internal Medicine

## 2017-07-12 ENCOUNTER — Ambulatory Visit (INDEPENDENT_AMBULATORY_CARE_PROVIDER_SITE_OTHER): Payer: Medicare PPO | Admitting: Internal Medicine

## 2017-07-12 VITALS — BP 136/84 | HR 68 | Temp 97.3°F | Resp 18 | Ht 67.0 in | Wt 238.2 lb

## 2017-07-12 DIAGNOSIS — R7303 Prediabetes: Secondary | ICD-10-CM

## 2017-07-12 DIAGNOSIS — E782 Mixed hyperlipidemia: Secondary | ICD-10-CM | POA: Diagnosis not present

## 2017-07-12 DIAGNOSIS — R7309 Other abnormal glucose: Secondary | ICD-10-CM

## 2017-07-12 DIAGNOSIS — M353 Polymyalgia rheumatica: Secondary | ICD-10-CM

## 2017-07-12 DIAGNOSIS — E039 Hypothyroidism, unspecified: Secondary | ICD-10-CM

## 2017-07-12 DIAGNOSIS — M1000 Idiopathic gout, unspecified site: Secondary | ICD-10-CM | POA: Diagnosis not present

## 2017-07-12 DIAGNOSIS — I10 Essential (primary) hypertension: Secondary | ICD-10-CM

## 2017-07-12 DIAGNOSIS — M159 Polyosteoarthritis, unspecified: Secondary | ICD-10-CM

## 2017-07-12 DIAGNOSIS — Z79899 Other long term (current) drug therapy: Secondary | ICD-10-CM

## 2017-07-12 DIAGNOSIS — M150 Primary generalized (osteo)arthritis: Secondary | ICD-10-CM | POA: Diagnosis not present

## 2017-07-12 DIAGNOSIS — E559 Vitamin D deficiency, unspecified: Secondary | ICD-10-CM | POA: Diagnosis not present

## 2017-07-13 LAB — HEPATIC FUNCTION PANEL
AG Ratio: 1.6 (calc) (ref 1.0–2.5)
ALBUMIN MSPROF: 4.1 g/dL (ref 3.6–5.1)
ALT: 25 U/L (ref 9–46)
AST: 23 U/L (ref 10–35)
Alkaline phosphatase (APISO): 90 U/L (ref 40–115)
BILIRUBIN DIRECT: 0.2 mg/dL (ref 0.0–0.2)
BILIRUBIN INDIRECT: 0.7 mg/dL (ref 0.2–1.2)
BILIRUBIN TOTAL: 0.9 mg/dL (ref 0.2–1.2)
GLOBULIN: 2.5 g/dL (ref 1.9–3.7)
Total Protein: 6.6 g/dL (ref 6.1–8.1)

## 2017-07-13 LAB — CBC WITH DIFFERENTIAL/PLATELET
BASOS PCT: 0.6 %
Basophils Absolute: 50 cells/uL (ref 0–200)
EOS ABS: 216 {cells}/uL (ref 15–500)
Eosinophils Relative: 2.6 %
HEMATOCRIT: 43.4 % (ref 38.5–50.0)
Hemoglobin: 15.2 g/dL (ref 13.2–17.1)
LYMPHS ABS: 1486 {cells}/uL (ref 850–3900)
MCH: 30.5 pg (ref 27.0–33.0)
MCHC: 35 g/dL (ref 32.0–36.0)
MCV: 87 fL (ref 80.0–100.0)
MPV: 10.2 fL (ref 7.5–12.5)
Monocytes Relative: 6.5 %
NEUTROS PCT: 72.4 %
Neutro Abs: 6009 cells/uL (ref 1500–7800)
Platelets: 220 10*3/uL (ref 140–400)
RBC: 4.99 10*6/uL (ref 4.20–5.80)
RDW: 13.2 % (ref 11.0–15.0)
TOTAL LYMPHOCYTE: 17.9 %
WBC: 8.3 10*3/uL (ref 3.8–10.8)
WBCMIX: 540 {cells}/uL (ref 200–950)

## 2017-07-13 LAB — BASIC METABOLIC PANEL WITH GFR
BUN: 10 mg/dL (ref 7–25)
CALCIUM: 9.1 mg/dL (ref 8.6–10.3)
CO2: 28 mmol/L (ref 20–32)
Chloride: 104 mmol/L (ref 98–110)
Creat: 0.92 mg/dL (ref 0.70–1.18)
GFR, EST NON AFRICAN AMERICAN: 82 mL/min/{1.73_m2} (ref >=60)
GFR, Est African American: 95 mL/min/{1.73_m2} (ref >=60)
Glucose, Bld: 120 mg/dL — ABNORMAL HIGH (ref 65–99)
Potassium: 4 mmol/L (ref 3.5–5.3)
Sodium: 138 mmol/L (ref 135–146)

## 2017-07-13 LAB — LIPID PANEL
CHOL/HDL RATIO: 5.2 (calc) — AB (ref <5.0)
Cholesterol: 212 mg/dL — ABNORMAL HIGH (ref <200)
HDL: 41 mg/dL (ref >40)
LDL CHOLESTEROL (CALC): 148 mg/dL — AB
NON-HDL CHOLESTEROL (CALC): 171 mg/dL — AB (ref <130)
TRIGLYCERIDES: 115 mg/dL (ref <150)

## 2017-07-13 LAB — URIC ACID: URIC ACID, SERUM: 5.8 mg/dL (ref 4.0–8.0)

## 2017-07-13 LAB — MAGNESIUM: Magnesium: 1.6 mg/dL (ref 1.5–2.5)

## 2017-07-13 LAB — HEMOGLOBIN A1C
EAG (MMOL/L): 5.5 (calc)
HEMOGLOBIN A1C: 5.1 %{Hb} (ref <5.7)
Mean Plasma Glucose: 100 (calc)

## 2017-07-13 LAB — SEDIMENTATION RATE: Sed Rate: 19 mm/h (ref 0–20)

## 2017-07-13 LAB — VITAMIN D 25 HYDROXY (VIT D DEFICIENCY, FRACTURES): VIT D 25 HYDROXY: 67 ng/mL (ref 30–100)

## 2017-07-13 LAB — TSH: TSH: 9.03 mIU/L — ABNORMAL HIGH (ref 0.40–4.50)

## 2017-07-13 LAB — INSULIN, RANDOM: Insulin: 13.4 u[IU]/mL (ref 2.0–19.6)

## 2017-07-16 ENCOUNTER — Encounter: Payer: Self-pay | Admitting: *Deleted

## 2017-07-16 ENCOUNTER — Other Ambulatory Visit: Payer: Self-pay | Admitting: *Deleted

## 2017-07-16 MED ORDER — ROSUVASTATIN CALCIUM 40 MG PO TABS: ORAL_TABLET | ORAL | 1 refills | Status: DC

## 2017-09-05 DIAGNOSIS — E119 Type 2 diabetes mellitus without complications: Secondary | ICD-10-CM | POA: Diagnosis not present

## 2017-10-16 ENCOUNTER — Other Ambulatory Visit: Payer: Self-pay | Admitting: Internal Medicine

## 2017-10-16 MED ORDER — PREDNISONE 20 MG PO TABS: ORAL_TABLET | ORAL | 0 refills | Status: DC

## 2017-10-17 NOTE — Progress Notes (Signed)
FOLLOW UP  Assessment and Plan:   Hypertension Well controlled with current medications  Monitor blood pressure at home; patient to call if consistently greater than 130/80 Continue DASH diet.   Reminder to go to the ER if any CP, SOB, nausea, dizziness, severe HA, changes vision/speech, left arm numbness and tingling and jaw pain.  Cholesterol Currently above goal despite dual therapy by zetia and crestor; titrate up as tolerated Continue low cholesterol diet and exercise.  Check lipid panel.   Other abnormal glucose Continue diet and exercise.  Perform daily foot/skin check, notify office of any concerning changes.  Check A1C  Hypothyroidism continue medications the same pending lab results reminded to take on an empty stomach 30-75mns before food.  check TSH level  Obesity with co morbidities Long discussion about weight loss, diet, and exercise Recommended diet heavy in fruits and veggies and low in animal meats, cheeses, and dairy products, appropriate calorie intake Discussed ideal weight for height and initial weight goal (230 lb) Patient will work on walking - goal of 150 min a week, increase fluid Will follow up in 3 months  Vitamin D Def At goal at last visit; continue supplementation to maintain goal of 70-100 Defer Vit D level  Gout Continue allopurinol Diet discussed Check uric acid as needed  BPH with frequency and nocturia Starting flomax and finasteride today; continue flomax for 1 year then discontinue Medications, risks/benefits discussed with patient, he would like to proceed with treatment  Continue diet and meds as discussed. Further disposition pending results of labs. Discussed med's effects and SE's.   Over 30 minutes of exam, counseling, chart review, and critical decision making was performed.   Future Appointments  Date Time Provider DToyah 01/18/2018 10:00 AM MUnk Pinto MD [facility] None     ----------------------------------------------------------------------------------------------------------------------  HPI 77y.o. male  presents for 3 month follow up on hypertension, cholesterol, hypothyroid, gout, glucose management, weight and vitamin D deficiency. He has a diagnosis of BPH but has not been prescribed medications for this; he does report frequency and nocturia, getting up 4+ times at night. Denies any difficulty initiating stream, sense of incomplete emptying.   BMI is Body mass index is 36.65 kg/m., he has not been working on diet and exercise. Wt Readings from Last 3 Encounters:  10/18/17 234 lb (106.1 kg)  07/12/17 238 lb 3.2 oz (108 kg)  04/10/17 229 lb 3.2 oz (104 kg)   His blood pressure has been controlled at home, today their BP is BP: 140/70  He does not workout. He denies chest pain, shortness of breath, dizziness.   He is on cholesterol medication (zetia 152mdaily, crestor 20 mg daily, red yeast rice once daily) and denies myalgias. His cholesterol is not at goal. The cholesterol last visit was:   Lab Results  Component Value Date   CHOL 212 (H) 07/12/2017   HDL 41 07/12/2017   LDLCALC 148 (H) 07/12/2017   TRIG 115 07/12/2017   CHOLHDL 5.2 (H) 07/12/2017    He has not been working on diet and exercise for glucose management, and denies foot ulcerations, increased appetite, nausea, paresthesia of the feet, polydipsia, polyuria, visual disturbances, vomiting and weight loss. Last A1C in the office was:  Lab Results  Component Value Date   HGBA1C 5.1 07/12/2017   He is on thyroid medication. His medication was not changed last visit.   Lab Results  Component Value Date   TSH 9.03 (H) 07/12/2017   Patient is on  Vitamin D supplement.   Lab Results  Component Value Date   VD25OH 67 07/12/2017     Patient is on allopurinol for gout and does not report a recent flare.  Lab Results  Component Value Date   LABURIC 5.8 07/12/2017       Current Medications:  Current Outpatient Medications on File Prior to Visit  Medication Sig  . ACCU-CHEK FASTCLIX LANCETS MISC CHECK  BLOOD  SUGAR ONE TIME DAILY  . ACCU-CHEK SMARTVIEW test strip CHECK  BLOOD  SUGAR ONE TIME DAILY  . allopurinol (ZYLOPRIM) 300 MG tablet TAKE 1 TABLET EVERY DAY  . Ascorbic Acid (VITAMIN C PO) Take 2,000 mg by mouth daily.   Marland Kitchen atenolol (TENORMIN) 100 MG tablet TAKE 1 TABLET EVERY DAY  . BABY ASPIRIN PO Take 81 mg by mouth daily.  . Blood Glucose Calibration (ACCU-CHEK SMARTVIEW CONTROL) LIQD Check blood sugar 1 time daily-DX-R73.03  . Blood Glucose Monitoring Suppl (ACCU-CHEK NANO SMARTVIEW) w/Device KIT Check blood sugar 1 time daily-DX-R73.03  . Cholecalciferol (VITAMIN D PO) Take 10,000 Int'l Units by mouth daily.  Marland Kitchen ezetimibe (ZETIA) 10 MG tablet Take 1 tablet (10 mg total) daily by mouth.  . levothyroxine (SYNTHROID, LEVOTHROID) 200 MCG tablet TAKE 1 AND 1/2 TABLETS EVERY DAY  . losartan (COZAAR) 50 MG tablet Start taking 50 mg daily, may go up to 100 mg in 2 weeks if BP remains above 140/90  . MAGNESIUM PO Take 250 mg by mouth 3 (three) times daily.   . Omega-3 Fatty Acids (FISH OIL PO) Take 1,000 mg by mouth daily.   . predniSONE (DELTASONE) 20 MG tablet Take1/2 - 1  Tablet once daily & try to skip days,  Rx should last 4 - 6 weeks  . Red Yeast Rice Extract (RED YEAST RICE PO) Take 1,000 mg by mouth daily.   . rosuvastatin (CRESTOR) 40 MG tablet Take 1/2 to 1 tablet daily or as directed for Cholesterol  . triamcinolone cream (KENALOG) 0.1 % Apply 1 application topically 3 (three) times daily.   No current facility-administered medications on file prior to visit.      Allergies:  Allergies  Allergen Reactions  . Lotensin [Benazepril Hcl]     unknown  . Penicillins Rash     Medical History:  Past Medical History:  Diagnosis Date  . BPH with elevated PSA   . Cataracts, bilateral   . Diverticulosis   . Gout   . Hx of adenomatous  colonic polyps 06/18/2014  . Hyperlipidemia   . Hypertension   . Prediabetes   . Thyroid disease   . Tubular adenoma 02/17/2003  . Umbilical hernia 75/02/2584   Family history- Reviewed and unchanged Social history- Reviewed and unchanged   Review of Systems:  Review of Systems  Constitutional: Negative for malaise/fatigue and weight loss.  HENT: Negative for hearing loss and tinnitus.   Eyes: Negative for blurred vision and double vision.  Respiratory: Negative for cough, shortness of breath and wheezing.   Cardiovascular: Negative for chest pain, palpitations, orthopnea, claudication and leg swelling.  Gastrointestinal: Negative for abdominal pain, blood in stool, constipation, diarrhea, heartburn, melena, nausea and vomiting.  Genitourinary: Positive for frequency. Negative for dysuria, flank pain, hematuria and urgency.  Musculoskeletal: Negative for joint pain and myalgias.  Skin: Negative for rash.  Neurological: Negative for dizziness, tingling, sensory change, weakness and headaches.  Endo/Heme/Allergies: Negative for polydipsia.  Psychiatric/Behavioral: Negative.   All other systems reviewed and are negative.     Physical  Exam: BP 140/70   Pulse 67   Temp (!) 97.5 F (36.4 C)   Ht _0  (1.702 m)   Wt 234 lb (106.1 kg)   SpO2 97%   BMI 36.65 kg/m  Wt Readings from Last 3 Encounters:  10/18/17 234 lb (106.1 kg)  07/12/17 238 lb 3.2 oz (108 kg)  04/10/17 229 lb 3.2 oz (104 kg)   General Appearance: Well nourished, in no apparent distress. Eyes: PERRLA, EOMs, conjunctiva no swelling or erythema Sinuses: No Frontal/maxillary tenderness ENT/Mouth: Ext aud canals clear, TMs without erythema, bulging. No erythema, swelling, or exudate on post pharynx.  Tonsils not swollen or erythematous. Hearing normal.  Neck: Supple, thyroid normal.  Respiratory: Respiratory effort normal, BS equal bilaterally without rales, rhonchi, wheezing or stridor.  Cardio: RRR with no  MRGs. Brisk peripheral pulses without edema.  Abdomen: Soft, + BS.  Non tender, no guarding, rebound, hernias, masses. Lymphatics: Non tender without lymphadenopathy.  Musculoskeletal: Full ROM, 5/5 strength, Normal gait Skin: Warm, dry without rashes, lesions, ecchymosis.  Neuro: Cranial nerves intact. No cerebellar symptoms.  Psych: Awake and oriented X 3, normal affect, Insight and Judgment appropriate.    Izora Ribas, NP 10:52 AM [facility]

## 2017-10-18 ENCOUNTER — Encounter: Payer: Self-pay | Admitting: Adult Health

## 2017-10-18 ENCOUNTER — Ambulatory Visit: Payer: Medicare PPO | Admitting: Adult Health

## 2017-10-18 VITALS — BP 140/70 | HR 67 | Temp 97.5°F | Ht 67.0 in | Wt 234.0 lb

## 2017-10-18 DIAGNOSIS — E782 Mixed hyperlipidemia: Secondary | ICD-10-CM

## 2017-10-18 DIAGNOSIS — M1000 Idiopathic gout, unspecified site: Secondary | ICD-10-CM

## 2017-10-18 DIAGNOSIS — N400 Enlarged prostate without lower urinary tract symptoms: Secondary | ICD-10-CM

## 2017-10-18 DIAGNOSIS — I10 Essential (primary) hypertension: Secondary | ICD-10-CM

## 2017-10-18 DIAGNOSIS — R7309 Other abnormal glucose: Secondary | ICD-10-CM | POA: Diagnosis not present

## 2017-10-18 DIAGNOSIS — E559 Vitamin D deficiency, unspecified: Secondary | ICD-10-CM | POA: Diagnosis not present

## 2017-10-18 DIAGNOSIS — Z79899 Other long term (current) drug therapy: Secondary | ICD-10-CM

## 2017-10-18 DIAGNOSIS — Z6830 Body mass index (BMI) 30.0-30.9, adult: Secondary | ICD-10-CM

## 2017-10-18 DIAGNOSIS — E039 Hypothyroidism, unspecified: Secondary | ICD-10-CM | POA: Diagnosis not present

## 2017-10-18 DIAGNOSIS — R972 Elevated prostate specific antigen [PSA]: Secondary | ICD-10-CM | POA: Diagnosis not present

## 2017-10-18 MED ORDER — FINASTERIDE 5 MG PO TABS: 5.0000 mg | ORAL_TABLET | Freq: Every day | ORAL | 1 refills | Status: DC

## 2017-10-18 MED ORDER — TAMSULOSIN HCL 0.4 MG PO CAPS: 0.4000 mg | ORAL_CAPSULE | Freq: Every day | ORAL | 1 refills | Status: DC

## 2017-10-18 NOTE — Patient Instructions (Addendum)
If your cholesterol looks good tomorrow - please stop taking red yeast rice - this is the same active ingredient as crestor   We are starting flomax (quick acting medication) and finasteride (slow acting medication to shrink your prostate) - we will continue flomax    Aim for 7+ servings of fruits and vegetables daily  80+ fluid ounces of water or unsweet tea for healthy kidneys  Avoid alcohol  Limit animal fats in diet for cholesterol and heart health - choose grass fed whenever available  Aim for low stress - take time to unwind and care for your mental health  Aim for 150 min of moderate intensity exercise weekly for heart health, and weights twice weekly for bone health  Aim for 7-9 hours of sleep daily  Tamsulosin capsules What is this medicine? TAMSULOSIN (tam SOO loe sin) is used to treat enlargement of the prostate gland in men, a condition called benign prostatic hyperplasia or BPH. It is not for use in women. It works by relaxing muscles in the prostate and bladder neck. This improves urine flow and reduces BPH symptoms. This medicine may be used for other purposes; ask your health care provider or pharmacist if you have questions. COMMON BRAND NAME(S): Flomax What should I tell my health care provider before I take this medicine? They need to know if you have any of the following conditions: -advanced kidney disease -advanced liver disease -low blood pressure -prostate cancer -an unusual or allergic reaction to tamsulosin, sulfa drugs, other medicines, foods, dyes, or preservatives -pregnant or trying to get pregnant -breast-feeding How should I use this medicine? Take this medicine by mouth about 30 minutes after the same meal every day. Follow the directions on the prescription label. Swallow the capsules whole with a glass of water. Do not crush, chew, or open capsules. Do not take your medicine more often than directed. Do not stop taking your medicine unless your  doctor tells you to. Talk to your pediatrician regarding the use of this medicine in children. Special care may be needed. Overdosage: If you think you have taken too much of this medicine contact a poison control center or emergency room at once. NOTE: This medicine is only for you. Do not share this medicine with others. What if I miss a dose? If you miss a dose, take it as soon as you can. If it is almost time for your next dose, take only that dose. Do not take double or extra doses. If you stop taking your medicine for several days or more, ask your doctor or health care professional what dose you should start back on. What may interact with this medicine? -cimetidine -fluoxetine -ketoconazole -medicines for erectile disfunction like sildenafil, tadalafil, vardenafil -medicines for high blood pressure -other alpha-blockers like alfuzosin, doxazosin, phentolamine, phenoxybenzamine, prazosin, terazosin -warfarin This list may not describe all possible interactions. Give your health care provider a list of all the medicines, herbs, non-prescription drugs, or dietary supplements you use. Also tell them if you smoke, drink alcohol, or use illegal drugs. Some items may interact with your medicine. What should I watch for while using this medicine? Visit your doctor or health care professional for regular check ups. You will need lab work done before you start this medicine and regularly while you are taking it. Check your blood pressure as directed. Ask your health care professional what your blood pressure should be, and when you should contact him or her. This medicine may make you feel dizzy  or lightheaded. This is more likely to happen after the first dose, after an increase in dose, or during hot weather or exercise. Drinking alcohol and taking some medicines can make this worse. Do not drive, use machinery, or do anything that needs mental alertness until you know how this medicine affects you.  Do not sit or stand up quickly. If you begin to feel dizzy, sit down until you feel better. These effects can decrease once your body adjusts to the medicine. Contact your doctor or health care professional right away if you have an erection that lasts longer than 4 hours or if it becomes painful. This may be a sign of a serious problem and must be treated right away to prevent permanent damage. If you are thinking of having cataract surgery, tell your eye surgeon that you have taken this medicine. What side effects may I notice from receiving this medicine? Side effects that you should report to your doctor or health care professional as soon as possible: -allergic reactions like skin rash or itching, hives, swelling of the lips, mouth, tongue, or throat -breathing problems -change in vision -feeling faint or lightheaded -irregular heartbeat -prolonged or painful erection -weakness Side effects that usually do not require medical attention (report to your doctor or health care professional if they continue or are bothersome): -back pain -change in sex drive or performance -constipation, nausea or vomiting -cough -drowsy -runny or stuffy nose -trouble sleeping This list may not describe all possible side effects. Call your doctor for medical advice about side effects. You may report side effects to FDA at 1-800-FDA-1088. Where should I keep my medicine? Keep out of the reach of children. Store at room temperature between 15 and 30 degrees C (59 and 86 degrees F). Throw away any unused medicine after the expiration date. NOTE: This sheet is a summary. It may not cover all possible information. If you have questions about this medicine, talk to your doctor, pharmacist, or health care provider.  2018 Elsevier/Gold Standard (2012-05-15 14:11:34)     Finasteride (Proscar) tablets What is this medicine? FINASTERIDE (fi NAS teer ide) is used to treat benign prostatic hyperplasia (BPH) in  men. This is a condition that causes you to have an enlarged prostate. This medicine helps to control your symptoms, decrease urinary retention, and reduces your risk of needing surgery. When used in combination with certain other medicines, this drug can slow down the progression of your disease. This medicine may be used for other purposes; ask your health care provider or pharmacist if you have questions. COMMON BRAND NAME(S): Proscar What should I tell my health care provider before I take this medicine? They need to know if you have any of these conditions: -liver disease -an unusual or allergic reaction to finasteride, other medicines, foods, dyes, or preservatives -pregnant or trying to get pregnant -breast-feeding How should I use this medicine? Take this medicine by mouth with a glass of water. Follow the directions on the prescription label. You can take this medicine with or without food. Take your doses at regular intervals. Do not take your medicine more often than directed. Do not stop taking except on the advice of your doctor or health care professional. Talk to your pediatrician regarding the use of this medicine in children. Special care may be needed. Overdosage: If you think you have taken too much of this medicine contact a poison control center or emergency room at once. NOTE: This medicine is only for you. Do not  share this medicine with others. What if I miss a dose? If you miss a dose, take it as soon as you can. If it is almost time for your next dose, take only that dose. Do not take double or extra doses. What may interact with this medicine? -saw palmetto or other dietary supplements This list may not describe all possible interactions. Give your health care provider a list of all the medicines, herbs, non-prescription drugs, or dietary supplements you use. Also tell them if you smoke, drink alcohol, or use illegal drugs. Some items may interact with your  medicine. What should I watch for while using this medicine? Do not donate blood while you are taking this medicine. This will prevent giving this medicine to a pregnant male through a blood transfusion. Ask your doctor or health care professional when it is safe to donate blood after you stop taking this medicine. Women who are pregnant or may get pregnant must not handle broken or crushed finasteride tablets. The active ingredient could harm the unborn baby. If a pregnant woman comes into contact with broken or crushed tablets she should check with her doctor or health care professional. Exposure to whole tablets is not expected to cause harm as long as they are not swallowed. Contact your doctor or health care professional if your symptoms do not start to get better. You may need to take this medicine for 6 to 12 months to get the best results. This medicine can interfere with PSA laboratory tests for prostate cancer. If you are scheduled to have a lab test for prostate cancer, tell your doctor or health care professional that you are taking this medicine. This medicine may increase your risk of getting some cancers, like breast cancer. Talk with your doctor. What side effects may I notice from receiving this medicine? Side effects that you should report to your doctor or health care professional as soon as possible: -any signs of an allergic reaction like rash, itching, hives or swelling of the lips or face -changes in breast like lumps, pain or fluids leaking from the nipple -pain in the testicles Side effects that usually do not require medical attention (report to your doctor or health care professional if they continue or are bothersome): -sexual difficulties like decreased sexual desire or ability to get an erection -small amount of semen released during sex This list may not describe all possible side effects. Call your doctor for medical advice about side effects. You may report side  effects to FDA at 1-800-FDA-1088. Where should I keep my medicine? Keep out of the reach of children. Store at room temperature below 30 degrees C (86 degrees F). Protect from light. Keep container tightly closed. Throw away any unused medicine after the expiration date. NOTE: This sheet is a summary. It may not cover all possible information. If you have questions about this medicine, talk to your doctor, pharmacist, or health care provider.  2018 Elsevier/Gold Standard (2014-12-31 17:24:30)

## 2017-10-19 ENCOUNTER — Other Ambulatory Visit: Payer: Self-pay | Admitting: Adult Health

## 2017-10-19 DIAGNOSIS — E039 Hypothyroidism, unspecified: Secondary | ICD-10-CM

## 2017-10-19 LAB — MAGNESIUM: Magnesium: 1.8 mg/dL (ref 1.5–2.5)

## 2017-10-19 LAB — COMPLETE METABOLIC PANEL WITHOUT GFR
AG Ratio: 1.6 (calc) (ref 1.0–2.5)
ALT: 25 U/L (ref 9–46)
AST: 24 U/L (ref 10–35)
Albumin: 4 g/dL (ref 3.6–5.1)
Alkaline phosphatase (APISO): 106 U/L (ref 40–115)
BUN: 13 mg/dL (ref 7–25)
CO2: 28 mmol/L (ref 20–32)
Calcium: 9.5 mg/dL (ref 8.6–10.3)
Chloride: 104 mmol/L (ref 98–110)
Creat: 0.77 mg/dL (ref 0.70–1.18)
GFR, Est African American: 103 mL/min/{1.73_m2}
GFR, Est Non African American: 89 mL/min/{1.73_m2}
Globulin: 2.5 g/dL (ref 1.9–3.7)
Glucose, Bld: 122 mg/dL — ABNORMAL HIGH (ref 65–99)
Potassium: 4.4 mmol/L (ref 3.5–5.3)
Sodium: 140 mmol/L (ref 135–146)
Total Bilirubin: 0.7 mg/dL (ref 0.2–1.2)
Total Protein: 6.5 g/dL (ref 6.1–8.1)

## 2017-10-19 LAB — CBC WITH DIFFERENTIAL/PLATELET
Basophils Absolute: 52 {cells}/uL (ref 0–200)
Basophils Relative: 0.7 %
Eosinophils Absolute: 170 {cells}/uL (ref 15–500)
Eosinophils Relative: 2.3 %
HCT: 43.1 % (ref 38.5–50.0)
Hemoglobin: 15.1 g/dL (ref 13.2–17.1)
Lymphs Abs: 1399 {cells}/uL (ref 850–3900)
MCH: 30.3 pg (ref 27.0–33.0)
MCHC: 35 g/dL (ref 32.0–36.0)
MCV: 86.4 fL (ref 80.0–100.0)
MPV: 10.3 fL (ref 7.5–12.5)
Monocytes Relative: 6.9 %
Neutro Abs: 5269 {cells}/uL (ref 1500–7800)
Neutrophils Relative %: 71.2 %
Platelets: 245 10*3/uL (ref 140–400)
RBC: 4.99 Million/uL (ref 4.20–5.80)
RDW: 12.7 % (ref 11.0–15.0)
Total Lymphocyte: 18.9 %
WBC mixed population: 511 {cells}/uL (ref 200–950)
WBC: 7.4 10*3/uL (ref 3.8–10.8)

## 2017-10-19 LAB — TSH: TSH: 0.05 m[IU]/L — ABNORMAL LOW (ref 0.40–4.50)

## 2017-10-19 LAB — LIPID PANEL
Cholesterol: 206 mg/dL — ABNORMAL HIGH
HDL: 39 mg/dL — ABNORMAL LOW
LDL Cholesterol (Calc): 147 mg/dL — ABNORMAL HIGH
Non-HDL Cholesterol (Calc): 167 mg/dL — ABNORMAL HIGH
Total CHOL/HDL Ratio: 5.3 (calc) — ABNORMAL HIGH
Triglycerides: 91 mg/dL

## 2017-10-19 MED ORDER — LEVOTHYROXINE SODIUM 200 MCG PO TABS: ORAL_TABLET | ORAL | 1 refills | Status: DC

## 2017-11-16 ENCOUNTER — Ambulatory Visit: Payer: Medicare PPO | Admitting: *Deleted

## 2017-11-16 DIAGNOSIS — E039 Hypothyroidism, unspecified: Secondary | ICD-10-CM

## 2017-11-16 LAB — TSH: TSH: 8.27 mIU/L — ABNORMAL HIGH (ref 0.40–4.50)

## 2017-11-16 NOTE — Progress Notes (Signed)
Patient is here for a NV to recheck his TSH.  He is taking Levothyroxine 200 mcg 1.5 tablet alternating 1 tablet daily. He also reported he has stopped the Finasteride, due to information from the intranet stating it can cause prostate cancer.

## 2017-11-18 ENCOUNTER — Other Ambulatory Visit: Payer: Self-pay | Admitting: Adult Health

## 2017-11-18 DIAGNOSIS — E039 Hypothyroidism, unspecified: Secondary | ICD-10-CM

## 2017-11-28 ENCOUNTER — Other Ambulatory Visit: Payer: Self-pay | Admitting: Adult Health

## 2017-11-28 ENCOUNTER — Other Ambulatory Visit: Payer: Self-pay | Admitting: Internal Medicine

## 2017-11-28 MED ORDER — PREDNISONE 10 MG PO TABS: ORAL_TABLET | ORAL | 0 refills | Status: DC

## 2017-12-02 ENCOUNTER — Other Ambulatory Visit: Payer: Self-pay | Admitting: Internal Medicine

## 2017-12-02 DIAGNOSIS — M150 Primary generalized (osteo)arthritis: Principal | ICD-10-CM

## 2017-12-02 DIAGNOSIS — M159 Polyosteoarthritis, unspecified: Secondary | ICD-10-CM

## 2017-12-02 MED ORDER — PREDNISONE 5 MG PO TABS: ORAL_TABLET | ORAL | 0 refills | Status: DC

## 2017-12-18 ENCOUNTER — Ambulatory Visit: Payer: Medicare PPO | Admitting: Internal Medicine

## 2017-12-18 ENCOUNTER — Encounter: Payer: Self-pay | Admitting: Internal Medicine

## 2017-12-18 VITALS — BP 156/80 | HR 63 | Temp 97.3°F | Resp 16 | Ht 67.0 in | Wt 231.8 lb

## 2017-12-18 DIAGNOSIS — E039 Hypothyroidism, unspecified: Secondary | ICD-10-CM

## 2017-12-18 DIAGNOSIS — K0530 Chronic periodontitis, unspecified: Secondary | ICD-10-CM | POA: Diagnosis not present

## 2017-12-18 DIAGNOSIS — I10 Essential (primary) hypertension: Secondary | ICD-10-CM

## 2017-12-18 LAB — TSH: TSH: 0.32 mIU/L — ABNORMAL LOW (ref 0.40–4.50)

## 2017-12-18 NOTE — Patient Instructions (Addendum)
Periodontal Disease Periodontal disease, also called gum disease or gingivitis, is inflammation, infection, or both that affects the tissue that surrounds and supports the teeth (periodontal tissue). Periodontal tissue includes the gums, the tissues (ligaments) that hold the teeth in place, and the tooth sockets (alveolar bones). Periodontal disease can affect tissue around one tooth or many teeth. If this condition is not treated, it can cause you to lose a tooth. What are the causes? This condition is usually caused by plaque. Plaque contains harmful bacteria that can cause gums to become swollen and infected. If periodontal disease gets worse (progresses), it can also damage other supporting tissues. Plaque can develop due to poor oral care, such as not brushing teeth enough, not visiting the dentist regularly, or eating and drinking too many sugary foods and beverages. What increases the risk? This condition is more likely to develop in people who:  Smoke.  Use tobacco.  Clench or grind their teeth.  Abuse substances.  Are going through the hormonal changes of puberty, menopause, or pregnancy.  Are under stress.  Are taking certain medicines, such as steroids, antiseizure medicines, or medicines to treat cancer.  Have diabetes.  Have poor nutrition.  Have a disease that interferes with the body's disease-fighting system (immune system).  Have a family history of periodontal disease.  What are the signs or symptoms? Symptoms of this condition include:  Red or swollen gums.  Bad breath that does not go away.  Gums that have pulled away from the teeth.  Gums that bleed easily.  Teeth that are loose or separating.  Pain when chewing.  Changes in the way your teeth fit together.  Sensitive teeth.  How is this diagnosed? This condition is diagnosed with an exam of the tissue around your teeth. Your health care provider may also take an X-ray of your teeth and ask about  your medical history. How is this treated? Treatment for this condition depends on the extent of the disease, which is determined by a dental exam. Treatment may include:  Brushing and flossing regularly.  A deep dental cleaning that involves scraping the buildup of plaque and tartar from below the gum line (scaling and root planing). This may be needed if the disease progresses.  Antibiotic medicines. These may be taken by mouth (orally) or as a rinse.  Surgery, in some severe cases. This may involve a surgery to lift up the gums and remove tartar deposits or to reduce a pocket of periodontal disease (flap surgery). Surgery might also involve procedures that replace damaged areas and help healthy bone and tissue to grow (bone and tissue grafting).  Follow these instructions at home:  Practice good oral hygiene: ? Brush your teeth two times a day with a soft toothbrush. ? Floss between your teeth every day. ? Get regular dental exams.  If you were prescribed antibiotic medicine or a rinse, take or use it as told by your health care provider. Do not stop taking or using the antibiotic even if your condition improves.  Take over-the-counter and prescription medicines only as told by your health care provider.  Do not use any products that contain nicotine or tobacco, such as cigarettes and e-cigarettes. If you need help quitting, ask your health care provider.  Eat a well-balanced diet that includes plenty of vegetables, fruits, whole grains, low-fat dairy products, and lean protein. ? Do not eat a lot of foods that are high in solid fats, added sugars, or salt. ? Avoid beverages that  contain a lot of sugar. Contact a health care provider if:  Your symptoms do not improve. Get help right away if:  You have swelling in your face, neck, or jaw.  You have severe pain that does not get better with medicine.  You have a fever. Summary  Periodontal disease, also called gum disease or  gingivitis, is inflammation, infection, or both that affects the tissue that surrounds and supports the teeth (periodontal tissue).  Practice good oral hygiene. Brush your teeth two times a day with a soft toothbrush. Floss between your teeth every day.  Do not use any products that contain nicotine or tobacco, such as cigarettes and e-cigarettes. If you need help quitting, ask your health care provider.  If you were prescribed antibiotic medicine or a rinse, take or use it as told by your health care provider. Do not stop taking or using the antibiotic even if your condition improves.

## 2017-12-19 ENCOUNTER — Ambulatory Visit: Payer: Self-pay

## 2017-12-22 ENCOUNTER — Encounter: Payer: Self-pay | Admitting: Internal Medicine

## 2017-12-22 MED ORDER — LOSARTAN POTASSIUM 50 MG PO TABS: ORAL_TABLET | ORAL | 1 refills | Status: DC

## 2017-12-22 NOTE — Progress Notes (Signed)
  Subjective:    Patient ID: Andrew Cochran, male    DOB: Jun 08, 1940, 77 y.o.   MRN: [id number]  HPI  Patient is a 77 yo MWM HTN, HLD, Pre-DM, Gout, Vit D Deficiency who has been on Thyroid replacement predating since 2002. Recent TSH was out of range & dose was adjusted and he presents for f/u. He also has "gratis" dental work done at the [facility] and & had extraction of # 6 carious molars. He endorses difficulty chewing and talking.   Medication Sig  . allopurinol (ZYLOPRIM) 300 MG tablet TAKE 1 TABLET EVERY DAY  . Ascorbic Acid (VITAMIN C PO) Take 2,000 mg by mouth daily.   Marland Kitchen atenolol (TENORMIN) 100 MG tablet TAKE 1 TABLET EVERY DAY  . BABY ASPIRIN PO Take 81 mg by mouth daily.  . Cholecalciferol (VITAMIN D PO) Take 10,000 Int'l Units by mouth daily.  Marland Kitchen ezetimibe (ZETIA) 10 MG tablet Take 1 tablet (10 mg total) daily by mouth.  . finasteride (PROSCAR) 5 MG tablet Take 1 tablet (5 mg total) by mouth daily.  Marland Kitchen levothyroxine  200 MCG tablet TAKE 1 AND 1/2 TABLETS EVERY DAY  . MAGNESIUM PO Take 250 mg by mouth 3 (three) times daily.   . Omega-3 FISH OIL  Take 1,000 mg by mouth daily.   . predniSONE 5 MG tablet Take1/2 - 1  Tablet once daily & try to skip days,  Rx should last 4 - 6 weeks  . rosuvastatin  40 MG tablet Take 1/2 to 1 tablet daily or as directed for Cholesterol  . tamsulosin  0.4 MG CAPS capsule Take 1 capsule (0.4 mg total) by mouth daily.  Marland Kitchen triamcinolone crm 0.1 % Apply 1 application topically 3 (three) times daily.  Marland Kitchen losartan  50 MG tablet Takes 50 mg daily   Allergies  Allergen Reactions  . Lotensin [Benazepril Hcl]     unknown  . Penicillins Rash   Past Medical History:  Diagnosis Date  . BPH with elevated PSA   . Cataracts, bilateral   . Diverticulosis   . Gout   . Hx of adenomatous colonic polyps 06/18/2014  . Hyperlipidemia   . Hypertension   . Prediabetes   . Thyroid disease   . Tubular adenoma 02/17/2003  . Umbilical hernia  74/16/3845   Review of Systems    10 point systems review negative except as above.   Objective:   Physical Exam  BP (!) 156/80   Pulse 63   Temp (!) 97.3 F (36.3 C)   Resp 16   Ht 5\' 7"  (1.702 m)   Wt 231 lb 12.8 oz (105.1 kg)   SpO2 96%   BMI 36.31 kg/m   BP rechecked at 160/93.   HEENT - Mouth reveals several remaining molars carious to the gum and periodontal gum erythema.  Neck - supple.  Chest - Clear equal BS. Cor - Nl HS. RRR w/o sig MGR. PP 1(+). No edema. MS- FROM w/o deformities.  Gait Nl. Neuro -  Nl w/o focal abnormalities.    Assessment & Plan:   1. Essential hypertension  - TSH - losartan (COZAAR) 50 MG tablet; Take 1 tablet daily for BP  Dispense: 90 tablet; Refill: 1  2. Hypothyroidism  - TSH  3. Periodontitis  - filled out forms recommending urgent dental repair

## 2018-01-01 ENCOUNTER — Other Ambulatory Visit: Payer: Self-pay | Admitting: Internal Medicine

## 2018-01-01 DIAGNOSIS — M150 Primary generalized (osteo)arthritis: Principal | ICD-10-CM

## 2018-01-01 DIAGNOSIS — M159 Polyosteoarthritis, unspecified: Secondary | ICD-10-CM

## 2018-01-01 MED ORDER — PREDNISONE 5 MG PO TABS: ORAL_TABLET | ORAL | 0 refills | Status: DC

## 2018-01-18 ENCOUNTER — Ambulatory Visit: Payer: Medicare PPO | Admitting: Internal Medicine

## 2018-01-18 ENCOUNTER — Encounter: Payer: Self-pay | Admitting: Internal Medicine

## 2018-01-18 VITALS — BP 136/76 | HR 68 | Temp 97.5°F | Resp 18 | Ht 67.0 in | Wt 220.4 lb

## 2018-01-18 DIAGNOSIS — Z125 Encounter for screening for malignant neoplasm of prostate: Secondary | ICD-10-CM

## 2018-01-18 DIAGNOSIS — N400 Enlarged prostate without lower urinary tract symptoms: Secondary | ICD-10-CM | POA: Diagnosis not present

## 2018-01-18 DIAGNOSIS — D369 Benign neoplasm, unspecified site: Secondary | ICD-10-CM

## 2018-01-18 DIAGNOSIS — Z0001 Encounter for general adult medical examination with abnormal findings: Secondary | ICD-10-CM

## 2018-01-18 DIAGNOSIS — Z136 Encounter for screening for cardiovascular disorders: Secondary | ICD-10-CM

## 2018-01-18 DIAGNOSIS — Z6834 Body mass index (BMI) 34.0-34.9, adult: Secondary | ICD-10-CM

## 2018-01-18 DIAGNOSIS — R7309 Other abnormal glucose: Secondary | ICD-10-CM | POA: Diagnosis not present

## 2018-01-18 DIAGNOSIS — Z1212 Encounter for screening for malignant neoplasm of rectum: Secondary | ICD-10-CM

## 2018-01-18 DIAGNOSIS — E6609 Other obesity due to excess calories: Secondary | ICD-10-CM

## 2018-01-18 DIAGNOSIS — E559 Vitamin D deficiency, unspecified: Secondary | ICD-10-CM | POA: Diagnosis not present

## 2018-01-18 DIAGNOSIS — E782 Mixed hyperlipidemia: Secondary | ICD-10-CM | POA: Diagnosis not present

## 2018-01-18 DIAGNOSIS — E039 Hypothyroidism, unspecified: Secondary | ICD-10-CM

## 2018-01-18 DIAGNOSIS — Z8249 Family history of ischemic heart disease and other diseases of the circulatory system: Secondary | ICD-10-CM

## 2018-01-18 DIAGNOSIS — R972 Elevated prostate specific antigen [PSA]: Secondary | ICD-10-CM

## 2018-01-18 DIAGNOSIS — N401 Enlarged prostate with lower urinary tract symptoms: Secondary | ICD-10-CM | POA: Diagnosis not present

## 2018-01-18 DIAGNOSIS — I700 Atherosclerosis of aorta: Secondary | ICD-10-CM

## 2018-01-18 DIAGNOSIS — M1000 Idiopathic gout, unspecified site: Secondary | ICD-10-CM

## 2018-01-18 DIAGNOSIS — N138 Other obstructive and reflux uropathy: Secondary | ICD-10-CM

## 2018-01-18 DIAGNOSIS — Z0000 Encounter for general adult medical examination without abnormal findings: Secondary | ICD-10-CM | POA: Diagnosis not present

## 2018-01-18 DIAGNOSIS — R7303 Prediabetes: Secondary | ICD-10-CM

## 2018-01-18 DIAGNOSIS — Z87891 Personal history of nicotine dependence: Secondary | ICD-10-CM

## 2018-01-18 DIAGNOSIS — Z1211 Encounter for screening for malignant neoplasm of colon: Secondary | ICD-10-CM

## 2018-01-18 DIAGNOSIS — Z79899 Other long term (current) drug therapy: Secondary | ICD-10-CM

## 2018-01-18 DIAGNOSIS — I10 Essential (primary) hypertension: Secondary | ICD-10-CM | POA: Diagnosis not present

## 2018-01-18 DIAGNOSIS — E66811 Obesity, class 1: Secondary | ICD-10-CM

## 2018-01-18 MED ORDER — PHENTERMINE HCL 37.5 MG PO TABS: ORAL_TABLET | ORAL | 5 refills | Status: DC

## 2018-01-18 NOTE — Progress Notes (Signed)
[facility]   Unk Pinto, M.D.     Uvaldo Bristle. Silverio Lay, P.A.-C Liane Comber, [place]                [address] [place], [place] [address] Telephone [phone] Telefax [phone] Annual  Screening/Preventative Visit  & Comprehensive Evaluation & Examination     This very nice 76 y.o. MWM presents for a Screening /Preventative Visit & comprehensive evaluation and management of multiple medical co-morbidities.  Patient has been followed for HTN, HLD, Prediabetes and Vitamin D Deficiency.     HTN predates circa 1997. Patient's BP has been controlled at home.  Today's BP is at goal - 136/76. Patient denies any cardiac symptoms as chest pain, palpitations, shortness of breath, dizziness or ankle swelling.     Patient's hyperlipidemia is controlled with diet and medications. Patient denies myalgias or other medication SE's. Last lipids were not at goal: Lab Results  Component Value Date   CHOL 206 (H) 10/18/2017   HDL 39 (L) 10/18/2017   LDLCALC 147 (H) 10/18/2017   TRIG 91 10/18/2017   CHOLHDL 5.3 (H) 10/18/2017      Patient has hx/o Morbid Obesity (BMI 37+)  and prediabetes  (A1c 6.4%/2011) and patient denies reactive hypoglycemic symptoms, visual blurring, diabetic polys or paresthesias. Last A1c was at goal: Lab Results  Component Value Date   HGBA1C 5.1 07/12/2017       Patient has been on Thyroid replacement predating since 2002.     Finally, patient has history of Vitamin D Deficiency  ("36"/2008)  and last vitamin D was at goal: Lab Results  Component Value Date   VD25OH 67 07/12/2017   Current Outpatient Medications on File Prior to Visit  Medication Sig  . ACCU-CHEK FASTCLIX LANCETS MISC CHECK  BLOOD  SUGAR ONE TIME DAILY  . ACCU-CHEK SMARTVIEW test strip CHECK  BLOOD  SUGAR ONE TIME DAILY  . allopurinol (ZYLOPRIM) 300 MG tablet TAKE 1 TABLET EVERY DAY  . Ascorbic Acid  (VITAMIN C PO) Take 2,000 mg by mouth daily.   Marland Kitchen atenolol (TENORMIN) 100 MG tablet TAKE 1 TABLET EVERY DAY  . BABY ASPIRIN PO Take 81 mg by mouth daily.  . Blood Glucose Calibration (ACCU-CHEK SMARTVIEW CONTROL) LIQD Check blood sugar 1 time daily-DX-R73.03  . Blood Glucose Monitoring Suppl (ACCU-CHEK NANO SMARTVIEW) w/Device KIT Check blood sugar 1 time daily-DX-R73.03  . Cholecalciferol (VITAMIN D PO) Take 10,000 Int'l Units by mouth daily.  Marland Kitchen ezetimibe (ZETIA) 10 MG tablet Take 1 tablet (10 mg total) daily by mouth.  . finasteride (PROSCAR) 5 MG tablet Take 1 tablet (5 mg total) by mouth daily.  Marland Kitchen levothyroxine (SYNTHROID, LEVOTHROID) 200 MCG tablet TAKE 1 AND 1/2 TABLETS EVERY DAY  . losartan (COZAAR) 50 MG tablet Take 1 tablet daily for BP  . MAGNESIUM PO Take 250 mg by mouth 3 (three) times daily.   . Omega-3 Fatty Acids (FISH OIL PO) Take 1,000 mg by mouth daily.   . predniSONE (DELTASONE) 5 MG tablet Take1/2 - 1  Tablet once daily & try to skip days,  Rx should last 4 - 6 weeks  . tamsulosin (FLOMAX) 0.4 MG CAPS capsule Take 1 capsule (0.4 mg total) by mouth daily.  Marland Kitchen triamcinolone cream (KENALOG) 0.1 % Apply 1 application topically 3 (three) times daily.  . rosuvastatin (CRESTOR) 40 MG tablet  Take 1/2 to 1 tablet daily or as directed for Cholesterol   No current facility-administered medications on file prior to visit.    Allergies  Allergen Reactions  . Lotensin [Benazepril Hcl]     unknown  . Penicillins Rash   Past Medical History:  Diagnosis Date  . BPH with elevated PSA   . Cataracts, bilateral   . Diverticulosis   . Gout   . Hx of adenomatous colonic polyps 06/18/2014  . Hyperlipidemia   . Hypertension   . Prediabetes   . Thyroid disease   . Tubular adenoma 02/17/2003  . Umbilical hernia 23/30/0762   Health Maintenance  Topic Date Due  . COLONOSCOPY  06/12/2017  . INFLUENZA VACCINE  12/27/2017  . PNA vac Low Risk Adult (2 of 2 - PCV13) 04/10/2018 (Originally  08/19/2009)  . TETANUS/TDAP  09/08/2019   Immunization History  Administered Date(s) Administered  . Pneumococcal Polysaccharide-23 08/19/2008  . Td 09/07/2009   Last Colon - 06/12/2014 - Dr Carlean Purl - recc 3 yr f/u - over due since 05/2017  Past Surgical History:  Procedure Laterality Date  . CATARACT EXTRACTION Right 2009  . COLONOSCOPY W/ BIOPSIES    . SHOULDER ARTHROSCOPY Right    1979   Family History  Problem Relation Age of Onset  . Colon cancer Neg Hx   . Esophageal cancer Neg Hx   . Rectal cancer Neg Hx   . Stomach cancer Neg Hx   . Cancer Mother   . COPD Father    Social History   Socioeconomic History  . Marital status: Married    Spouse name: Vaughan Basta   . Number of children: 3 children  Occupational History  . Retired Freight forwarder  Tobacco Use  . Smoking status: Former Smoker    Last attempt to quit: 05/30/1959    Years since quitting: 58.6  . Smokeless tobacco: Never Used  Substance and Sexual Activity  . Alcohol use: No  . Drug use: No  . Sexual activity: Not on file    ROS Constitutional: Denies fever, chills, weight loss/gain, headaches, insomnia,  night sweats or change in appetite. Does c/o fatigue. Eyes: Denies redness, blurred vision, diplopia, discharge, itchy or watery eyes.  ENT: Denies discharge, congestion, post nasal drip, epistaxis, sore throat, earache, hearing loss, dental pain, Tinnitus, Vertigo, Sinus pain or snoring.  Cardio: Denies chest pain, palpitations, irregular heartbeat, syncope, dyspnea, diaphoresis, orthopnea, PND, claudication or edema Respiratory: denies cough, dyspnea, DOE, pleurisy, hoarseness, laryngitis or wheezing.  Gastrointestinal: Denies dysphagia, heartburn, reflux, water brash, pain, cramps, nausea, vomiting, bloating, diarrhea, constipation, hematemesis, melena, hematochezia, jaundice or hemorrhoids Genitourinary: Denies dysuria, frequency, discharge, hematuria or flank pain. Has urgency, nocturia x 2-3 & occasional  hesitancy. Musculoskeletal: Denies arthralgia, myalgia, stiffness, Jt. Swelling, pain, limp or strain/sprain. Denies Falls. Skin: Denies puritis, rash, hives, warts, acne, eczema or change in skin lesion Neuro: No weakness, tremor, incoordination, spasms, paresthesia or pain Psychiatric: Denies confusion, memory loss or sensory loss. Denies Depression. Endocrine: Denies change in weight, skin, hair change, nocturia, and paresthesia, diabetic polys, visual blurring or hyper / hypo glycemic episodes.  Heme/Lymph: No excessive bleeding, bruising or enlarged lymph nodes.  Physical Exam  BP 136/76   Pulse 68   Temp (!) 97.5 F (36.4 C)   Resp 18   Ht '5\' 7"'  (1.702 m)   Wt 220 lb 6.4 oz (100 kg)   BMI 34.52 kg/m   General Appearance: Well nourished and well groomed and in no apparent distress.  Eyes: PERRLA, EOMs, conjunctiva no swelling or erythema, normal fundi and vessels. Sinuses: No frontal/maxillary tenderness ENT/Mouth: EACs patent / TMs  nl. Nares clear without erythema, swelling, mucoid exudates. Oral hygiene is good. No erythema, swelling, or exudate. Tongue normal, non-obstructing. Tonsils not swollen or erythematous. Hearing normal.  Neck: Supple, thyroid not palpable. No bruits, nodes or JVD. Respiratory: Respiratory effort normal.  BS equal and clear bilateral without rales, rhonci, wheezing or stridor. Cardio: Heart sounds are normal with regular rate and rhythm and no murmurs, rubs or gallops. Peripheral pulses are normal and equal bilaterally without edema. No aortic or femoral bruits. Chest: symmetric with normal excursions and percussion.  Abdomen: Soft, with Nl bowel sounds. Nontender, no guarding, rebound, hernias, masses, or organomegaly.  Lymphatics: Non tender without lymphadenopathy.  Genitourinary: No hernias.Testes nl. DRE - prostate nl for age - smooth & firm w/o nodules. Musculoskeletal: Full ROM all peripheral extremities, joint stability, 5/5 strength, and  normal gait. Skin: Warm and dry without rashes, lesions, cyanosis, clubbing or  ecchymosis.  Neuro: Cranial nerves intact, reflexes equal bilaterally. Normal muscle tone, no cerebellar symptoms. Sensation intact.  Pysch: Alert and oriented X 3 with normal affect, insight and judgment appropriate.   Assessment and Plan  1. Annual Preventative/Screening Exam   2. Essential hypertension  - EKG 12-Lead - [place], RETROPERITNL ABD,  LTD - Urinalysis, Routine w reflex microscopic - Microalbumin / creatinine urine ratio - CBC with Differential/Platelet - COMPLETE METABOLIC PANEL WITH GFR - Magnesium - TSH  3. Hyperlipidemia, mixed  - EKG 12-Lead - [place], RETROPERITNL ABD,  LTD - Lipid panel - TSH  4. Abnormal glucose  - EKG 12-Lead - [place], RETROPERITNL ABD,  LTD - Hemoglobin A1c - Insulin, random  5. Vitamin D deficiency  - VITAMIN D 25 Hydroxy  6. Prediabetes  - EKG 12-Lead - [place], RETROPERITNL ABD,  LTD - Hemoglobin A1c - Insulin, random  7. Hypothyroidism  - TSH  8. Idiopathic gout  - Uric acid  9. Screening for colorectal cancer  - POC Hemoccult Bld/Stl  10. BPH with elevated PSA  11. BPH with obstruction/lower urinary tract symptoms  12. Prostate cancer screening  13. Screening for ischemic heart disease  - EKG 12-Lead  14. FHx: heart disease  - EKG 12-Lead - [place], RETROPERITNL ABD,  LTD  15. Former smoker  - EKG 12-Lead - [place], RETROPERITNL ABD,  LTD  16. Aortic atherosclerosis (HCC)  - [place], RETROPERITNL ABD,  LTD  17. Screening for AAA (aortic abdominal aneurysm)  - [place], RETROPERITNL ABD,  LTD  18. Medication management  - Urinalysis, Routine w reflex microscopic - Microalbumin / creatinine urine ratio - CBC with Differential/Platelet - COMPLETE METABOLIC PANEL WITH GFR - Magnesium - Lipid panel - TSH - Hemoglobin A1c - Insulin, random - VITAMIN D 25 Hydroxyl - Uric acid  19. Class 1 obesity due to excess calories with serious  comorbidity and body mass index (BMI) of 34.0 to 34.9 in adult  - phentermine (ADIPEX-P) 37.5 MG tablet; Take 1/2 to 1 tablet every morning for dieting & weight  loss  Dispense: 30 tablet; Refill: 5     Patient was counseled in prudent diet, weight control to achieve/maintain BMI less than 25, BP monitoring, regular exercise and medications as discussed.  Discussed med effects and SE's. Routine screening labs and tests as requested with regular follow-up as recommended. Over 40 minutes of exam, counseling, chart review and high complex critical decision making was performed

## 2018-01-18 NOTE — Patient Instructions (Signed)

## 2018-01-20 ENCOUNTER — Encounter: Payer: Self-pay | Admitting: Internal Medicine

## 2018-01-21 ENCOUNTER — Encounter: Payer: Self-pay | Admitting: *Deleted

## 2018-01-22 ENCOUNTER — Other Ambulatory Visit: Payer: Self-pay | Admitting: *Deleted

## 2018-01-22 DIAGNOSIS — Z6834 Body mass index (BMI) 34.0-34.9, adult: Principal | ICD-10-CM

## 2018-01-22 DIAGNOSIS — E6609 Other obesity due to excess calories: Secondary | ICD-10-CM

## 2018-01-22 LAB — URINALYSIS, ROUTINE W REFLEX MICROSCOPIC
BILIRUBIN URINE: NEGATIVE
Bacteria, UA: NONE SEEN /HPF
GLUCOSE, UA: NEGATIVE
HGB URINE DIPSTICK: NEGATIVE
Hyaline Cast: NONE SEEN /LPF
Ketones, ur: NEGATIVE
Leukocytes, UA: NEGATIVE
NITRITE: NEGATIVE
PH: 5.5 (ref 5.0–8.0)
Specific Gravity, Urine: 1.016 (ref 1.001–1.03)
Squamous Epithelial / LPF: NONE SEEN /HPF (ref <=5)

## 2018-01-22 LAB — COMPLETE METABOLIC PANEL WITH GFR
AG Ratio: 1.5 (calc) (ref 1.0–2.5)
ALBUMIN MSPROF: 4.2 g/dL (ref 3.6–5.1)
ALKALINE PHOSPHATASE (APISO): 112 U/L (ref 40–115)
ALT: 50 U/L — ABNORMAL HIGH (ref 9–46)
AST: 42 U/L — ABNORMAL HIGH (ref 10–35)
BILIRUBIN TOTAL: 0.9 mg/dL (ref 0.2–1.2)
BUN: 9 mg/dL (ref 7–25)
CHLORIDE: 105 mmol/L (ref 98–110)
CO2: 29 mmol/L (ref 20–32)
Calcium: 9.7 mg/dL (ref 8.6–10.3)
Creat: 0.81 mg/dL (ref 0.70–1.18)
GFR, EST AFRICAN AMERICAN: 101 mL/min/{1.73_m2} (ref >=60)
GFR, Est Non African American: 87 mL/min/{1.73_m2} (ref >=60)
GLOBULIN: 2.8 g/dL (ref 1.9–3.7)
GLUCOSE: 122 mg/dL — AB (ref 65–99)
Potassium: 4.1 mmol/L (ref 3.5–5.3)
SODIUM: 141 mmol/L (ref 135–146)
TOTAL PROTEIN: 7 g/dL (ref 6.1–8.1)

## 2018-01-22 LAB — MICROALBUMIN / CREATININE URINE RATIO
CREATININE, URINE: 113 mg/dL (ref 20–320)
Microalb Creat Ratio: 390 mcg/mg creat — ABNORMAL HIGH (ref <30)
Microalb, Ur: 44.1 mg/dL

## 2018-01-22 LAB — URIC ACID: Uric Acid, Serum: 5.8 mg/dL (ref 4.0–8.0)

## 2018-01-22 LAB — CBC WITH DIFFERENTIAL/PLATELET
BASOS ABS: 51 {cells}/uL (ref 0–200)
Basophils Relative: 0.8 %
EOS ABS: 282 {cells}/uL (ref 15–500)
EOS PCT: 4.4 %
HCT: 43.6 % (ref 38.5–50.0)
Hemoglobin: 15.3 g/dL (ref 13.2–17.1)
Lymphs Abs: 1632 cells/uL (ref 850–3900)
MCH: 30.3 pg (ref 27.0–33.0)
MCHC: 35.1 g/dL (ref 32.0–36.0)
MCV: 86.3 fL (ref 80.0–100.0)
MONOS PCT: 8.1 %
MPV: 10.4 fL (ref 7.5–12.5)
Neutro Abs: 3917 cells/uL (ref 1500–7800)
Neutrophils Relative %: 61.2 %
PLATELETS: 260 10*3/uL (ref 140–400)
RBC: 5.05 10*6/uL (ref 4.20–5.80)
RDW: 13.1 % (ref 11.0–15.0)
Total Lymphocyte: 25.5 %
WBC mixed population: 518 cells/uL (ref 200–950)
WBC: 6.4 10*3/uL (ref 3.8–10.8)

## 2018-01-22 LAB — LIPID PANEL
Cholesterol: 215 mg/dL — ABNORMAL HIGH (ref <200)
HDL: 39 mg/dL — ABNORMAL LOW (ref >40)
LDL CHOLESTEROL (CALC): 155 mg/dL — AB
Non-HDL Cholesterol (Calc): 176 mg/dL (calc) — ABNORMAL HIGH (ref <130)
TRIGLYCERIDES: 100 mg/dL (ref <150)
Total CHOL/HDL Ratio: 5.5 (calc) — ABNORMAL HIGH (ref <5.0)

## 2018-01-22 LAB — HEMOGLOBIN A1C
HEMOGLOBIN A1C: 5 %{Hb} (ref <5.7)
MEAN PLASMA GLUCOSE: 97 (calc)
eAG (mmol/L): 5.4 (calc)

## 2018-01-22 LAB — TEST AUTHORIZATION

## 2018-01-22 LAB — VITAMIN D 25 HYDROXY (VIT D DEFICIENCY, FRACTURES): Vit D, 25-Hydroxy: 93 ng/mL (ref 30–100)

## 2018-01-22 LAB — MAGNESIUM: MAGNESIUM: 1.8 mg/dL (ref 1.5–2.5)

## 2018-01-22 LAB — TSH: TSH: 0.03 mIU/L — ABNORMAL LOW (ref 0.40–4.50)

## 2018-01-22 LAB — INSULIN, RANDOM: INSULIN: 9.5 u[IU]/mL (ref 2.0–19.6)

## 2018-01-22 LAB — PSA: PSA: 3.7 ng/mL (ref <=4.0)

## 2018-01-22 MED ORDER — PHENTERMINE HCL 37.5 MG PO TABS: ORAL_TABLET | ORAL | 5 refills | Status: DC

## 2018-02-28 ENCOUNTER — Encounter: Payer: Self-pay | Admitting: Internal Medicine

## 2018-03-01 DIAGNOSIS — E119 Type 2 diabetes mellitus without complications: Secondary | ICD-10-CM | POA: Diagnosis not present

## 2018-03-04 ENCOUNTER — Other Ambulatory Visit: Payer: Self-pay | Admitting: Internal Medicine

## 2018-03-04 DIAGNOSIS — M150 Primary generalized (osteo)arthritis: Principal | ICD-10-CM

## 2018-03-04 DIAGNOSIS — M159 Polyosteoarthritis, unspecified: Secondary | ICD-10-CM

## 2018-03-04 MED ORDER — PREDNISONE 5 MG PO TABS: ORAL_TABLET | ORAL | 0 refills | Status: DC

## 2018-03-21 ENCOUNTER — Ambulatory Visit: Payer: Medicare PPO | Admitting: Internal Medicine

## 2018-03-21 VITALS — BP 132/82 | HR 80 | Temp 97.3°F | Resp 16 | Ht 67.0 in | Wt 213.0 lb

## 2018-03-21 DIAGNOSIS — B86 Scabies: Secondary | ICD-10-CM

## 2018-03-21 MED ORDER — PREDNISONE 20 MG PO TABS: ORAL_TABLET | ORAL | 0 refills | Status: DC

## 2018-03-21 MED ORDER — PERMETHRIN 1 % EX LOTN: TOPICAL_LOTION | CUTANEOUS | 1 refills | Status: DC

## 2018-03-21 NOTE — Patient Instructions (Signed)
Scabies, Adult Scabies is a skin condition that happens when very small insects get under the skin (infestation). This causes a rash and severe itchiness. Scabies can spread from person to person (is contagious). If you get scabies, it is common for others in your household to get scabies too. With proper treatment, symptoms usually go away in 2-4 weeks. Scabies usually does not cause lasting problems. What are the causes? This condition is caused by mites (Sarcoptes scabiei, or human itch mites) that can only be seen with a microscope. The mites get into the top layer of skin and lay eggs. Scabies can spread from person to person through:  Close contact with a person who has scabies.  Contact with infested items, such as towels, bedding, or clothing.  What increases the risk? This condition is more likely to develop in:  People who live in nursing homes and other extended-care facilities.  People who have sexual contact with a partner who has scabies.  Young children who attend child care facilities.  People who care for others who are at increased risk for scabies.  What are the signs or symptoms? Symptoms of this condition may include:  Severe itchiness. This is often worse at night.  A rash that includes tiny red bumps or blisters. The rash commonly occurs on the wrist, elbow, armpit, fingers, waist, groin, or buttocks. Bumps may form a line (burrow) in some areas.  Skin irritation. This can include scaly patches or sores.  How is this diagnosed? This condition is diagnosed with a physical exam. Your health care provider will look closely at your skin. In some cases, your health care provider may take a sample of your affected skin (skin scraping) and have it examined under a microscope. How is this treated? This condition may be treated with:  Medicated cream or lotion that kills the mites. This is spread on the entire body and left on for several hours. Usually, one treatment  with medicated cream or lotion is enough to kill all of the mites. In severe cases, the treatment may be repeated.  Medicated cream that relieves itching.  Medicines that help to relieve itching.  Medicines that kill the mites. This treatment is rarely used.  Follow these instructions at home:  Medicines  Take or apply over-the-counter and prescription medicines as told by your health care provider.  Apply medicated cream or lotion as told by your health care provider.  Do not wash off the medicated cream or lotion until the necessary amount of time has passed. Skin Care  Avoid scratching your affected skin.  Keep your fingernails closely trimmed to reduce injury from scratching.  Take cool baths or apply cool washcloths to help reduce itching. General instructions  Clean all items that you recently had contact with, including bedding, clothing, and furniture. Do this on the same day that your treatment starts. ? Use hot water when you wash items. ? Place unwashable items into closed, airtight plastic bags for at least 3 days. The mites cannot live for more than 3 days away from human skin. ? Vacuum furniture and mattresses that you use.  Make sure that other people who may have been infested are examined by a health care provider. These include members of your household and anyone who may have had contact with infested items.  Keep all follow-up visits as told by your health care provider. This is important. Contact a health care provider if:  You have itching that does not go away   after 4 weeks of treatment.  You continue to develop new bumps or burrows.  You have redness, swelling, or pain in your rash area after treatment.  You have fluid, blood, or pus coming from your rash. This information is not intended to replace advice given to you by your health care provider. Make sure you discuss any questions you have with your health care provider. Document Released:  02/03/2015 Document Revised: 10/21/2015 Document Reviewed: 12/15/2014 Elsevier Interactive Patient Education  2018 [employer]  

## 2018-03-21 NOTE — Progress Notes (Signed)
Subjective:    Patient ID: Andrew Cochran, male    DOB: 1941-01-18, 77 y.o.   MRN: [id number]  HPI   This very nice 77 yo MWM presents with a 2 week hx/o severe intense pruritis associated with a rash of his chest.   Outpatient Medications Prior to Visit  Medication Sig Dispense Refill  . ACCU-CHEK FASTCLIX LANCETS MISC CHECK  BLOOD  SUGAR ONE TIME DAILY 102 each 3  . ACCU-CHEK SMARTVIEW test strip CHECK  BLOOD  SUGAR ONE TIME DAILY 100 each 3  . allopurinol (ZYLOPRIM) 300 MG tablet TAKE 1 TABLET EVERY DAY 90 tablet 1  . Ascorbic Acid (VITAMIN C PO) Take 2,000 mg by mouth daily.     Marland Kitchen atenolol (TENORMIN) 100 MG tablet TAKE 1 TABLET EVERY DAY 90 tablet 1  . BABY ASPIRIN PO Take 81 mg by mouth daily.    . Blood Glucose Calibration (ACCU-CHEK SMARTVIEW CONTROL) LIQD Check blood sugar 1 time daily-DX-R73.03 1 each 3  . Blood Glucose Monitoring Suppl (ACCU-CHEK NANO SMARTVIEW) w/Device KIT Check blood sugar 1 time daily-DX-R73.03 1 kit 0  . Cholecalciferol (VITAMIN D PO) Take 10,000 Int'l Units by mouth daily.    Marland Kitchen ezetimibe (ZETIA) 10 MG tablet Take 1 tablet (10 mg total) daily by mouth. 30 tablet 4  . finasteride (PROSCAR) 5 MG tablet Take 1 tablet (5 mg total) by mouth daily. 90 tablet 1  . levothyroxine (SYNTHROID, LEVOTHROID) 200 MCG tablet TAKE 1 AND 1/2 TABLETS EVERY DAY 135 tablet 1  . losartan (COZAAR) 50 MG tablet Take 1 tablet daily for BP 90 tablet 1  . MAGNESIUM PO Take 250 mg by mouth 3 (three) times daily.     . Omega-3 Fatty Acids (FISH OIL PO) Take 1,000 mg by mouth daily.     . phentermine (ADIPEX-P) 37.5 MG tablet Take 1/2 to 1 tablet every morning for dieting & weight  loss 30 tablet 5  . predniSONE (DELTASONE) 5 MG tablet Take1/2 - 1  Tablet once daily & try to skip days,  Rx should last 4 - 6 weeks 30 tablet 0  . rosuvastatin (CRESTOR) 40 MG tablet Take 1/2 to 1 tablet daily or as directed for Cholesterol 90 tablet 1  . tamsulosin (FLOMAX) 0.4 MG CAPS capsule Take 1 capsule  (0.4 mg total) by mouth daily. 90 capsule 1  . triamcinolone cream (KENALOG) 0.1 % Apply 1 application topically 3 (three) times daily. 30 g 0   No facility-administered medications prior to visit.    Allergies  Allergen Reactions  . Lotensin [Benazepril Hcl]     unknown  . Penicillins Rash   Past Medical History:  Diagnosis Date  . BPH with elevated PSA   . Cataracts, bilateral   . Diverticulosis   . Gout   . Hx of adenomatous colonic polyps 06/18/2014  . Hyperlipidemia   . Hypertension   . Prediabetes   . Thyroid disease   . Tubular adenoma 02/17/2003  . Umbilical hernia 28/36/6294   Review of Systems    10 point systems review negative except as above.    Objective:   Physical Exam  BP 132/82   Pulse 80   Temp (!) 97.3 F (36.3 C)   Resp 16   Ht '5\' 7"'  (1.702 m)   Wt 213 lb (96.6 kg)   BMI 33.36 kg/m   HEENT - WNL. Neck - supple.  Chest - Clear equal BS. Cor - Nl HS. RRR w/o sig MGR. PP  1(+). No edema. MS- FROM w/o deformities.  Gait Nl. Neuro -  Nl w/o focal abnormalities. Skin - Classic scabies rash of anterior chest    Assessment & Plan:   1. Scabies  - permethrin (ELIMITE) 1 % lotion; Apply to affected area once  Dispense: 60 mL; Refill: 1 - predniSONE (DELTASONE) 20 MG tablet; 1 tab 3 x day for 3 days, then 1 tab 2 x day for 3 days, then 1 tab 1 x day for 5 days  Dispense: 20 tablet; Refill: 0

## 2018-03-24 ENCOUNTER — Encounter: Payer: Self-pay | Admitting: Internal Medicine

## 2018-03-29 ENCOUNTER — Telehealth: Payer: Self-pay | Admitting: *Deleted

## 2018-03-29 NOTE — Telephone Encounter (Signed)
After reviewing the pt's chart for his up coming Colonoscopy and pre-visit, I see he was seen by Dr.Mckeown for Scabies and was given medication for this.  I asked patient is he better and he states "No". Explained to the patient that our policy states that he can not come here for his colonoscopy until he is cleared from this and his PCP states that he is healed. Explained that scabies is very contagious. Explained to the patient that I cancelled his up coming nurse visit and colonoscopy and that he needs to call us back to reschedule these when he is healed. Also encouraged patient to call his PCP and let him know he is not better.

## 2018-04-01 DIAGNOSIS — L111 Transient acantholytic dermatosis [Grover]: Secondary | ICD-10-CM | POA: Diagnosis not present

## 2018-04-08 ENCOUNTER — Other Ambulatory Visit: Payer: Self-pay | Admitting: Internal Medicine

## 2018-04-08 DIAGNOSIS — B86 Scabies: Secondary | ICD-10-CM

## 2018-04-08 MED ORDER — AZITHROMYCIN 250 MG PO TABS: ORAL_TABLET | ORAL | 0 refills | Status: DC

## 2018-04-08 MED ORDER — PROMETHAZINE-DM 6.25-15 MG/5ML PO SYRP: ORAL_SOLUTION | ORAL | 1 refills | Status: DC

## 2018-04-22 ENCOUNTER — Encounter: Payer: Medicare Other | Admitting: Internal Medicine

## 2018-04-30 ENCOUNTER — Other Ambulatory Visit: Payer: Self-pay | Admitting: Internal Medicine

## 2018-05-01 NOTE — Progress Notes (Signed)
MEDICARE ANNUAL WELLNESS VISIT AND FOLLOW UP Assessment:   Encounter for Medicare annual wellness exam 1 year  Essential hypertension - continue medications, DASH diet, exercise and monitor at home. Call if greater than 130/80.  -     CBC with Differential/Platelet -     COMPLETE METABOLIC PANEL WITH GFR -     TSH  Aortic atherosclerosis (HCC) Control blood pressure, cholesterol, glucose, increase exercise.   Morbid obesity ([place]) - follow up 3 months for progress monitoring - increase veggies, decrease carbs - long discussion about weight loss, diet, and exercise  Polymyalgia rheumatica (HCC) Continue to monitor States no fever, chills, no joint pain  Weight loss ? From hyperthyroid, will check again this visit Getting colonoscopy Normal PSA/DRE, check labs, ? From PMR but no symptoms at this time, no trouble swallowing, will monitor closely  Hypothyroidism, unspecified type -     TSH - he is on 1/1.5 pills alternating days  Mixed hyperlipidemia check lipids decrease fatty foods increase activity.  -     Lipid panel  Other abnormal glucose Discussed disease progression and risks Discussed diet/exercise, weight management and risk modification  Vitamin D deficiency Continue supplement  Medication management -     Magnesium  Idiopathic gout, unspecified chronicity, unspecified site Gout- recheck Uric acid as needed, Diet discussed, continue medications.  BPH with elevated PSA Checked recently, no symptoms  Hx of adenomatous colonic polyps Scheduled next week  Cataract of both eyes, unspecified cataract type Monitor  Primary osteoarthritis involving multiple joints Increase exercise  Iron deficiency anemia, unspecified iron deficiency anemia type -     Iron,Total/Total Iron Binding Cap  B12 deficiency -     Vitamin B12  BMI 32.0-32.9,adult - follow up 3 months for progress monitoring - increase veggies, decrease carbs - long discussion about  weight loss, diet, and exercise    Future Appointments  Date Time Provider [place]  05/03/2018 11:00 AM [facility] PREVISIT RM 51 [facility]  05/16/2018  2:00 PM Gatha Mayer, MD [facility]  08/08/2018 10:30 AM Unk Pinto, MD [facility] None     Plan:   During the course of the visit the patient was educated and counseled about appropriate screening and preventive services including:    Pneumococcal vaccine   Influenza vaccine  Td vaccine  Screening electrocardiogram  Colorectal cancer screening  Diabetes screening  Glaucoma screening  Nutrition counseling    Subjective:  Andrew Cochran is a 77 y.o. male who presents for Medicare Annual Wellness Visit and 3 month follow up for HTN, hyperlipidemia, prediabetes, and vitamin D Def.   He has history of nonfocal TIA in 2008, no sequela. He states that he missed a turn coming here, has not happened before, memory is not good but states it is unchanged.   His blood pressure has been controlled at home, today their BP is BP: 140/80 He does not workout. He denies chest pain, shortness of breath, dizziness.   He has history of PMR x 2011, has been tapered off steroids, does take rarely He is on cholesterol medication, he is on crestor 1/2 pill daily, he is not on zetia and denies myalgias. His cholesterol is not at goal. The cholesterol last visit was:   Lab Results  Component Value Date   CHOL 215 (H) 01/18/2018   HDL 39 (L) 01/18/2018   LDLCALC 155 (H) 01/18/2018   TRIG 100 01/18/2018   CHOLHDL 5.5 (H) 01/18/2018   Last A1C in the office  was normal, did have A1C of 6.4 in 2011 but has decreased with exercise/diet:  Lab Results  Component Value Date   HGBA1C 5.0 01/18/2018   Patient is on Vitamin D supplement.   Lab Results  Component Value Date   VD25OH 63 01/18/2018     He is on thyroid medication. His medication was not changed last visit. He is on 1.5 M, 1, T, 1.5 W, 1 T, 1.5 F, 1  S, and 1.5 S Patient denies nervousness, palpitations and weight changes.  Lab Results  Component Value Date   TSH 0.03 (L) 01/18/2018  .  Patient is on allopurinol for gout and does not report a recent flare.  He has 3 kids, 5 grand kids and 1 great grand kid.  BMI is Body mass index is 32.42 kg/m. He states he has been losing weight and not trying to weight. He has not been on the phentermine. He states his appetite has been less than normal. Denies any night sweats. AB [place] 2012 showed fatty liver. He had colonoscopy 2016, + adenomas, getting colonoscopy this week, no diarrhea/constipation. No trouble swallowing. Last PSA 3.7 and last DRE normal.  Wt Readings from Last 3 Encounters:  05/02/18 207 lb (93.9 kg)  03/21/18 213 lb (96.6 kg)  01/18/18 220 lb 6.4 oz (100 kg)   Patient is on allopurinol for gout and does not report a recent flare, last flare 6 months ago.  Lab Results  Component Value Date   LABURIC 5.8 01/18/2018    Names of Other Physician/Practitioners you currently use: 1. [facility] here for primary care 2. Dr. Katy Fitch, eye doctor, Jan 2015, over due 3. [facility]. Pearson [facility], dentist, 2017 Patient Care Team: Unk Pinto, MD as PCP - General (Internal Medicine) [place], [facility] Eduardo Osier., MD as Attending Physician (Urology) Gatha Mayer, MD as Consulting Physician (Gastroenterology) Clent Jacks, MD as Consulting Physician (Ophthalmology)  Medication Review: Current Outpatient Medications on File Prior to Visit  Medication Sig Dispense Refill  . ACCU-CHEK FASTCLIX LANCETS MISC CHECK  BLOOD  SUGAR ONE TIME DAILY 102 each 3  . ACCU-CHEK SMARTVIEW test strip CHECK  BLOOD  SUGAR ONE TIME DAILY 100 each 3  . allopurinol (ZYLOPRIM) 300 MG tablet TAKE 1 TABLET EVERY DAY 90 tablet 1  . Ascorbic Acid (VITAMIN C PO) Take 2,000 mg by mouth daily.     Marland Kitchen atenolol (TENORMIN) 100 MG tablet TAKE 1 TABLET EVERY DAY 90 tablet  1  . BABY ASPIRIN PO Take 81 mg by mouth daily.    . Blood Glucose Calibration (ACCU-CHEK SMARTVIEW CONTROL) LIQD Check blood sugar 1 time daily-DX-R73.03 1 each 3  . Blood Glucose Monitoring Suppl (ACCU-CHEK NANO SMARTVIEW) w/Device KIT Check blood sugar 1 time daily-DX-R73.03 1 kit 0  . Cholecalciferol (VITAMIN D PO) Take 10,000 Int'l Units by mouth daily.    . finasteride (PROSCAR) 5 MG tablet Take 1 tablet (5 mg total) by mouth daily. 90 tablet 1  . levothyroxine (SYNTHROID, LEVOTHROID) 200 MCG tablet TAKE 1 AND 1/2 TABLETS EVERY DAY 135 tablet 1  . losartan (COZAAR) 50 MG tablet Take 1 tablet daily for BP 90 tablet 1  . MAGNESIUM PO Take 250 mg by mouth 3 (three) times daily.     . Omega-3 Fatty Acids (FISH OIL PO) Take 1,000 mg by mouth daily.     . permethrin (ELIMITE) 1 % lotion Apply to affected area once 60 mL 1  . phentermine (  ADIPEX-P) 37.5 MG tablet Take 1/2 to 1 tablet every morning for dieting & weight  loss 30 tablet 5  . predniSONE (DELTASONE) 20 MG tablet TAKE 1 TABLET BY MOUTH THREE TIMES DAILY AS DIRECTED 30 tablet 0  . promethazine-dextromethorphan (PROMETHAZINE-DM) 6.25-15 MG/5ML syrup Take 1 to 2 tsp enery 4 hours if needed for cough 360 mL 1  . tamsulosin (FLOMAX) 0.4 MG CAPS capsule Take 1 capsule (0.4 mg total) by mouth daily. 90 capsule 1  . triamcinolone cream (KENALOG) 0.1 % Apply 1 application topically 3 (three) times daily. 30 g 0  . ezetimibe (ZETIA) 10 MG tablet Take 1 tablet (10 mg total) daily by mouth. 30 tablet 4  . rosuvastatin (CRESTOR) 40 MG tablet Take 1/2 to 1 tablet daily or as directed for Cholesterol 90 tablet 1   No current facility-administered medications on file prior to visit.     Current Problems (verified) Patient Active Problem List   Diagnosis Date Noted  . Aortic atherosclerosis ([place]) 04/10/2017  . Morbid obesity ([place]) 07/07/2014  . DJD  07/07/2014  . Polymyalgia rheumatica ([place]) 07/07/2014  . Hx of adenomatous colonic polyps  06/18/2014  . Other abnormal glucose 09/25/2013  . Vitamin D deficiency 09/25/2013  . Medication management 09/25/2013  . Cataracts, bilateral   . Gout   . Hypertension   . Hypothyroidism   . BPH with elevated PSA   . Hyperlipidemia      Immunization History  Administered Date(s) Administered  . Pneumococcal Polysaccharide-23 08/19/2008  . Td 09/07/2009    Preventative care: Last colonoscopy: 05/2014 Dr. Carlean Purl, + adenomas 3 years [place] AB 2012- had fatty liver US neck 2014 CXR 10/2013 CT head 10/2003  Prior vaccinations: TD or Tdap: 2011  Influenza: declines Pneumococcal: 2010 Prevnar 13: declines Shingles/Zostavax: declines  Allergies Allergies  Allergen Reactions  . Lotensin [Benazepril Hcl]     unknown  . Penicillins Rash    SURGICAL HISTORY He  has a past surgical history that includes Cataract extraction (Right, 2009); Shoulder arthroscopy (Right); and Colonoscopy w/ biopsies. FAMILY HISTORY His family history includes COPD in his father; Cancer in his mother. SOCIAL HISTORY He  reports that he quit smoking about 58 years ago. He has never used smokeless tobacco. He reports that he does not drink alcohol or use drugs.  MEDICARE WELLNESS OBJECTIVES: Physical activity:   Cardiac risk factors:   Depression/mood screen:   Depression screen [facility] 2/9 01/20/2018  Decreased Interest 0  Down, Depressed, Hopeless 0  PHQ - 2 Score 0    ADLs:  In your present state of health, do you have any difficulty performing the following activities: 01/20/2018 12/22/2017  Hearing? N N  Vision? N N  Difficulty concentrating or making decisions? N N  Walking or climbing stairs? N N  Dressing or bathing? N N  Doing errands, shopping? N N  Some recent data might be hidden    Cognitive Testing  Alert? Yes  Normal Appearance?Yes  Oriented to person? Yes  Place? Yes   Time? Yes  Recall of three objects?  1/3  Can perform simple calculations? Yes  Displays appropriate  judgment?Yes  Can read the correct time from a watch face?Yes  EOL planning: Does Patient Have a Medical Advance Directive?: Yes Does patient want to make changes to medical advance directive?: No - Patient declined He does not have advanced directives, states wife will make the decision, if she passes will fill them out so 3 kids does not have to  make choice  MMSE - Mini Mental State Exam 05/02/2018  Orientation to time 5  Orientation to Place 5  Registration 3  Attention/ Calculation 5  Recall 1  Language- name 2 objects 2  Language- repeat 1  Language- follow 3 step command 3  Language- read & follow direction 1  Write a sentence 1  Copy design 1  Total score 28     Objective:   Blood pressure 140/80, pulse 70, temperature (!) 97.2 F (36.2 C), height _0  (1.702 m), weight 207 lb (93.9 kg), SpO2 98 %. Body mass index is 32.42 kg/m.  General appearance: alert, no distress, WD/WN, male HEENT: normocephalic, sclerae anicteric, TMs pearly, nares patent, no discharge or erythema, pharynx normal Oral cavity: MMM, no lesions Neck: supple, no lymphadenopathy, no thyromegaly, no masses Heart: RRR, normal S1, S2, no murmurs Lungs: CTA bilaterally, no wheezes, rhonchi, or rales Abdomen: +bs, soft, non tender, non distended, no masses, no hepatomegaly, no splenomegaly Musculoskeletal: nontender, no swelling, no obvious deformity Extremities: no edema, no cyanosis, no clubbing Pulses: 2+ symmetric, upper and lower extremities, normal cap refill Neurological: alert, oriented x 3, CN2-12 intact, strength normal upper extremities and lower extremities, sensation normal throughout, DTRs 2+ throughout, no cerebellar signs, gait normal Skin: Has several scaly silver patches on legs, has non healing tender nodule right lateral ankle Psychiatric: normal affect, behavior normal, pleasant   Medicare Attestation I have personally reviewed: The patient's medical and social history Their use  of alcohol, tobacco or illicit drugs Their current medications and supplements The patient's functional ability including ADLs,fall risks, home safety risks, cognitive, and hearing and visual impairment Diet and physical activities Evidence for depression or mood disorders  The patient's weight, height, BMI, and visual acuity have been recorded in the chart.  I have made referrals, counseling, and provided education to the patient based on review of the above and I have provided the patient with a written personalized care plan for preventive services.     Vicie Mutters, PA-C   05/02/2018

## 2018-05-02 ENCOUNTER — Ambulatory Visit: Payer: Medicare PPO | Admitting: Physician Assistant

## 2018-05-02 ENCOUNTER — Encounter: Payer: Self-pay | Admitting: Physician Assistant

## 2018-05-02 VITALS — BP 140/80 | HR 70 | Temp 97.2°F | Ht 67.0 in | Wt 207.0 lb

## 2018-05-02 DIAGNOSIS — R7309 Other abnormal glucose: Secondary | ICD-10-CM

## 2018-05-02 DIAGNOSIS — M159 Polyosteoarthritis, unspecified: Secondary | ICD-10-CM

## 2018-05-02 DIAGNOSIS — Z79899 Other long term (current) drug therapy: Secondary | ICD-10-CM

## 2018-05-02 DIAGNOSIS — E559 Vitamin D deficiency, unspecified: Secondary | ICD-10-CM | POA: Diagnosis not present

## 2018-05-02 DIAGNOSIS — I10 Essential (primary) hypertension: Secondary | ICD-10-CM

## 2018-05-02 DIAGNOSIS — E538 Deficiency of other specified B group vitamins: Secondary | ICD-10-CM | POA: Diagnosis not present

## 2018-05-02 DIAGNOSIS — Z6832 Body mass index (BMI) 32.0-32.9, adult: Secondary | ICD-10-CM

## 2018-05-02 DIAGNOSIS — R6889 Other general symptoms and signs: Secondary | ICD-10-CM

## 2018-05-02 DIAGNOSIS — I700 Atherosclerosis of aorta: Secondary | ICD-10-CM

## 2018-05-02 DIAGNOSIS — E782 Mixed hyperlipidemia: Secondary | ICD-10-CM

## 2018-05-02 DIAGNOSIS — Z860101 Personal history of adenomatous and serrated colon polyps: Secondary | ICD-10-CM

## 2018-05-02 DIAGNOSIS — Z86010 Personal history of colonic polyps: Secondary | ICD-10-CM

## 2018-05-02 DIAGNOSIS — Z0000 Encounter for general adult medical examination without abnormal findings: Secondary | ICD-10-CM

## 2018-05-02 DIAGNOSIS — Z0001 Encounter for general adult medical examination with abnormal findings: Secondary | ICD-10-CM

## 2018-05-02 DIAGNOSIS — M353 Polymyalgia rheumatica: Secondary | ICD-10-CM

## 2018-05-02 DIAGNOSIS — N400 Enlarged prostate without lower urinary tract symptoms: Secondary | ICD-10-CM

## 2018-05-02 DIAGNOSIS — H269 Unspecified cataract: Secondary | ICD-10-CM

## 2018-05-02 DIAGNOSIS — R972 Elevated prostate specific antigen [PSA]: Secondary | ICD-10-CM

## 2018-05-02 DIAGNOSIS — R634 Abnormal weight loss: Secondary | ICD-10-CM | POA: Diagnosis not present

## 2018-05-02 DIAGNOSIS — E039 Hypothyroidism, unspecified: Secondary | ICD-10-CM

## 2018-05-02 DIAGNOSIS — M150 Primary generalized (osteo)arthritis: Secondary | ICD-10-CM

## 2018-05-02 DIAGNOSIS — D509 Iron deficiency anemia, unspecified: Secondary | ICD-10-CM | POA: Diagnosis not present

## 2018-05-02 DIAGNOSIS — M1000 Idiopathic gout, unspecified site: Secondary | ICD-10-CM

## 2018-05-02 NOTE — Patient Instructions (Addendum)
Vascular Dementia Dementia is a condition in which a person has problems with thinking, memory, and behavior that are severe enough to interfere with daily life. Vascular dementia is a type of dementia. It results from brain damage that is caused by the brain not getting enough blood. Vascular dementia usually begins between 60 and 75 years of age. What are the causes? Vascular dementia is caused by conditions that lessen blood flow to the brain. Common causes include:  Multiple small strokes. These may happen without symptoms (silent stroke).  Major stroke.  Damage to small blood vessels in the brain (cerebral small vessel disease).  What increases the risk?  Advancing age.  Having had a stroke.  Having high blood pressure (hypertension) or high cholesterol.  Having a disease that affects the heart or blood vessels.  Smoking.  Having diabetes.  Being male.  Being obese.  Not being active.  Having depression. What are the signs or symptoms? Symptoms can vary a lot from one person to another. Symptoms may be mild or severe depending on the amount of damage and which parts of the brain have been affected. Symptoms may begin suddenly or may develop gradually. Symptoms may remain stable, or they may get worse over time. Symptoms of vascular dementia may be similar to those of Alzheimer disease. The two conditions can occur together (mixed dementia). Symptoms of vascular dementia may include: Mental  Confusion.  Memory problems.  Poor attention and concentration.  Trouble understanding speech.  Depression.  Personality changes.  Trouble recognizing familiar people.  Agitation or aggression.  Paranoia.  Delusions or hallucinations. Physical  Weakness.  Poor balance.  Loss of bladder or bowel control (incontinence).  Unsteady walking (gait).  Speaking problems. Behavioral  Getting lost in familiar places.  Problems with planning and  judgment.  Trouble following instructions.  Social problems.  Emotional outbursts.  Trouble with daily activities and self-care.  Problems handling money. How is this diagnosed? There is not a specific test to diagnose vascular dementia. The health care provider will consider the person's medical history and symptoms or changes that are reported by friends and family. The health care provider will do a physical exam and may order lab tests or other tests that check brain and nervous system function. Tests that may be done include:  Blood tests.  Brain imaging tests.  Tests of movement, speech, and other daily activities (neurological exam).  Tests of memory, thinking, and problem-solving (neuropsychological or neurocognitive testing).  Diagnosis may involve several specialists. These may include a health care provider who specializes in the brain and nervous system (neurologist), a provider who specializes in disorders of the mind (psychiatrist), and a provider who focuses on speech and language changes (speech pathologist). How is this treated? There is no cure for vascular dementia. Brain damage that has already occurred cannot be reversed. Treatment depends on:  How severe the condition is.  Which parts of the brain have been affected.  The person's overall health.  Treatment measures aim to:  Treat the underlying cause of vascular dementia and manage risk factors. This may include: ? Controlling blood pressure. ? Lowering cholesterol. ? Treating diabetes. ? Quitting smoking. ? Losing weight.  Manage symptoms.  Prevent further brain damage.  Improve the person's health and quality of life.  Treatment for dementia may involve a team of health care providers, including:  A neurologist.  A psychiatrist.  An occupational therapist.  A speech pathologist.  A cardiologist.  An exercise physiologist or physical   therapist.  Follow these instructions at  home: Home care for a person with vascular dementia depends on what caused the condition and how severe the symptoms are. General guidelines for care at home include:  Following the health care provider's instructions for treating the condition that caused the dementia.  Using medicines only as told by the person's health care provider.  Creating a safe living space.  Learning ways to help the person remember people, appointments, and daily activities.  Finding a support group to help caregivers and family to cope with the effects of dementia.  Helping family and friends learn about ways to communicate with a person who has dementia.  Making sure the person keeps all follow-up visits and goes to all rehabilitation appointments as told by the health care team. This is important.  Contact a health care provider if:  A fever develops.  New behavioral problems develop.  Problems with swallowing develop.  Confusion gets worse.  Sleepiness gets worse. Get help right away if:  Loss of consciousness occurs.  There is a sudden loss of speech, balance, or thinking ability.  New numbness or paralysis occurs.  Sudden, severe headache occurs.  Vision is lost or suddenly gets worse in one or both eyes. This information is not intended to replace advice given to you by your health care provider. Make sure you discuss any questions you have with your health care provider. Document Released: 05/05/2002 Document Revised: 10/21/2015 Document Reviewed: 08/26/2014 Elsevier Interactive Patient Education  2018 [employer]  

## 2018-05-03 ENCOUNTER — Ambulatory Visit (AMBULATORY_SURGERY_CENTER): Payer: Self-pay

## 2018-05-03 ENCOUNTER — Encounter: Payer: Self-pay | Admitting: Internal Medicine

## 2018-05-03 VITALS — Ht 67.0 in | Wt 205.6 lb

## 2018-05-03 DIAGNOSIS — Z86010 Personal history of colonic polyps: Secondary | ICD-10-CM

## 2018-05-03 LAB — COMPLETE METABOLIC PANEL WITH GFR
AG RATIO: 1.5 (calc) (ref 1.0–2.5)
ALT: 43 U/L (ref 9–46)
AST: 29 U/L (ref 10–35)
Albumin: 4 g/dL (ref 3.6–5.1)
Alkaline phosphatase (APISO): 109 U/L (ref 40–115)
BILIRUBIN TOTAL: 0.9 mg/dL (ref 0.2–1.2)
BUN: 16 mg/dL (ref 7–25)
CALCIUM: 9.6 mg/dL (ref 8.6–10.3)
CHLORIDE: 103 mmol/L (ref 98–110)
CO2: 29 mmol/L (ref 20–32)
Creat: 0.81 mg/dL (ref 0.70–1.18)
GFR, EST AFRICAN AMERICAN: 101 mL/min/{1.73_m2} (ref >=60)
GFR, EST NON AFRICAN AMERICAN: 87 mL/min/{1.73_m2} (ref >=60)
GLUCOSE: 109 mg/dL — AB (ref 65–99)
Globulin: 2.6 g/dL (calc) (ref 1.9–3.7)
POTASSIUM: 4.4 mmol/L (ref 3.5–5.3)
Sodium: 140 mmol/L (ref 135–146)
TOTAL PROTEIN: 6.6 g/dL (ref 6.1–8.1)

## 2018-05-03 LAB — CBC WITH DIFFERENTIAL/PLATELET
BASOS PCT: 0.6 %
Basophils Absolute: 52 cells/uL (ref 0–200)
Eosinophils Absolute: 310 cells/uL (ref 15–500)
Eosinophils Relative: 3.6 %
HCT: 43.3 % (ref 38.5–50.0)
Hemoglobin: 15 g/dL (ref 13.2–17.1)
Lymphs Abs: 1746 cells/uL (ref 850–3900)
MCH: 30.1 pg (ref 27.0–33.0)
MCHC: 34.6 g/dL (ref 32.0–36.0)
MCV: 86.9 fL (ref 80.0–100.0)
MONOS PCT: 7.6 %
MPV: 10.1 fL (ref 7.5–12.5)
NEUTROS PCT: 67.9 %
Neutro Abs: 5839 cells/uL (ref 1500–7800)
PLATELETS: 250 10*3/uL (ref 140–400)
RBC: 4.98 10*6/uL (ref 4.20–5.80)
RDW: 13.2 % (ref 11.0–15.0)
TOTAL LYMPHOCYTE: 20.3 %
WBC mixed population: 654 cells/uL (ref 200–950)
WBC: 8.6 10*3/uL (ref 3.8–10.8)

## 2018-05-03 LAB — VITAMIN B12: Vitamin B-12: 1051 pg/mL (ref 200–1100)

## 2018-05-03 LAB — IRON, TOTAL/TOTAL IRON BINDING CAP
%SAT: 42 % (calc) (ref 20–48)
Iron: 116 ug/dL (ref 50–180)
TIBC: 276 mcg/dL (calc) (ref 250–425)

## 2018-05-03 LAB — LIPID PANEL
Cholesterol: 219 mg/dL — ABNORMAL HIGH (ref <200)
HDL: 37 mg/dL — AB (ref >40)
LDL Cholesterol (Calc): 155 mg/dL (calc) — ABNORMAL HIGH
Non-HDL Cholesterol (Calc): 182 mg/dL (calc) — ABNORMAL HIGH (ref <130)
TRIGLYCERIDES: 148 mg/dL (ref <150)
Total CHOL/HDL Ratio: 5.9 (calc) — ABNORMAL HIGH (ref <5.0)

## 2018-05-03 LAB — MAGNESIUM: Magnesium: 1.9 mg/dL (ref 1.5–2.5)

## 2018-05-03 LAB — TSH: TSH: 0.01 mIU/L — ABNORMAL LOW (ref 0.40–4.50)

## 2018-05-03 NOTE — Progress Notes (Signed)
Denies allergies to eggs or soy products. Denies complication of anesthesia or sedation. Denies use of weight loss medication. Denies use of O2.   Emmi instructions declined.  

## 2018-05-07 DIAGNOSIS — L111 Transient acantholytic dermatosis [Grover]: Secondary | ICD-10-CM | POA: Diagnosis not present

## 2018-05-16 ENCOUNTER — Ambulatory Visit (AMBULATORY_SURGERY_CENTER): Payer: Medicare PPO | Admitting: Internal Medicine

## 2018-05-16 ENCOUNTER — Encounter: Payer: Self-pay | Admitting: Internal Medicine

## 2018-05-16 VITALS — BP 165/80 | HR 64 | Temp 96.2°F | Resp 11 | Ht 67.0 in | Wt 207.0 lb

## 2018-05-16 DIAGNOSIS — D123 Benign neoplasm of transverse colon: Secondary | ICD-10-CM

## 2018-05-16 DIAGNOSIS — D122 Benign neoplasm of ascending colon: Secondary | ICD-10-CM | POA: Diagnosis not present

## 2018-05-16 DIAGNOSIS — D124 Benign neoplasm of descending colon: Secondary | ICD-10-CM | POA: Diagnosis not present

## 2018-05-16 DIAGNOSIS — Z86010 Personal history of colonic polyps: Secondary | ICD-10-CM

## 2018-05-16 DIAGNOSIS — Z1211 Encounter for screening for malignant neoplasm of colon: Secondary | ICD-10-CM | POA: Diagnosis not present

## 2018-05-16 MED ORDER — SODIUM CHLORIDE 0.9 % IV SOLN: 500.0000 mL | Freq: Once | INTRAVENOUS | Status: DC

## 2018-05-16 NOTE — Patient Instructions (Addendum)
I found and removed 3 tiny polyps.  You still have diverticulosis - thickened muscle rings and pouches in the colon wall. Please read the handout about this condition.  I will let you know pathology results and when or if to have another routine colonoscopy by mail and/or My Chart.  I appreciate the opportunity to care for you. Gatha Mayer, MD, [facility]  Polyp handout given to patient. Diverticulosis handout given to patient.  YOU HAD AN ENDOSCOPIC PROCEDURE TODAY AT [facility]:   Refer to the procedure report that was given to you for any specific questions about what was found during the examination.  If the procedure report does not answer your questions, please call your gastroenterologist to clarify.  If you requested that your care partner not be given the details of your procedure findings, then the procedure report has been included in a sealed envelope for you to review at your convenience later.  YOU SHOULD EXPECT: Some feelings of bloating in the abdomen. Passage of more gas than usual.  Walking can help get rid of the air that was put into your GI tract during the procedure and reduce the bloating. If you had a lower endoscopy (such as a colonoscopy or flexible sigmoidoscopy) you may notice spotting of blood in your stool or on the toilet paper. If you underwent a bowel prep for your procedure, you may not have a normal bowel movement for a few days.  Please Note:  You might notice some irritation and congestion in your nose or some drainage.  This is from the oxygen used during your procedure.  There is no need for concern and it should clear up in a day or so.  SYMPTOMS TO REPORT IMMEDIATELY:   Following lower endoscopy (colonoscopy or flexible sigmoidoscopy):  Excessive amounts of blood in the stool  Significant tenderness or worsening of abdominal pains  Swelling of the abdomen that is new, acute  Fever of 100F or higher For urgent or emergent  issues, a gastroenterologist can be reached at any hour by calling [phone].   DIET:  We do recommend a small meal at first, but then you may proceed to your regular diet.  Drink plenty of fluids but you should avoid alcoholic beverages for 24 hours.  ACTIVITY:  You should plan to take it easy for the rest of today and you should NOT DRIVE or use heavy machinery until tomorrow (because of the sedation medicines used during the test).    FOLLOW UP: Our staff will call the number listed on your records the next business day following your procedure to check on you and address any questions or concerns that you may have regarding the information given to you following your procedure. If we do not reach you, we will leave a message.  However, if you are feeling well and you are not experiencing any problems, there is no need to return our call.  We will assume that you have returned to your regular daily activities without incident.  If any biopsies were taken you will be contacted by phone or by letter within the next 1-3 weeks.  Please call us at [phone] if you have not heard about the biopsies in 3 weeks.    SIGNATURES/CONFIDENTIALITY: You and/or your care partner have signed paperwork which will be entered into your electronic medical record.  These signatures attest to the fact that that the information above on your After Visit Summary has been  reviewed and is understood.  Full responsibility of the confidentiality of this discharge information lies with you and/or your care-partner. 

## 2018-05-16 NOTE — Op Note (Signed)
[place] Patient Name: Andrew Cochran Procedure Date: 05/16/2018 2:18 PM MRN: [id number] Endoscopist: Gatha Mayer , MD Age: 77 Referring MD:  Date of Birth: 1940/08/11 Gender: Male Account #: [id number] Procedure:                Colonoscopy Indications:              Surveillance: Personal history of adenomatous                            polyps on last colonoscopy > 3 years ago Medicines:                Propofol per Anesthesia, Monitored Anesthesia Care Procedure:                Pre-Anesthesia Assessment:                           - Prior to the procedure, a History and Physical                            was performed, and patient medications and                            allergies were reviewed. The patient's tolerance of                            previous anesthesia was also reviewed. The risks                            and benefits of the procedure and the sedation                            options and risks were discussed with the patient.                            All questions were answered, and informed consent                            was obtained. Prior Anticoagulants: The patient has                            taken no previous anticoagulant or antiplatelet                            agents. ASA Grade Assessment: II - A patient with                            mild systemic disease. After reviewing the risks                            and benefits, the patient was deemed in                            satisfactory condition to undergo the procedure.  After obtaining informed consent, the colonoscope                            was passed under direct vision. Throughout the                            procedure, the patient's blood pressure, pulse, and                            oxygen saturations were monitored continuously. The                            Colonoscope was introduced through the anus and   advanced to the the cecum, identified by                            appendiceal orifice and ileocecal valve. The                            colonoscopy was performed without difficulty. The                            patient tolerated the procedure well. The quality                            of the bowel preparation was good. The ileocecal                            valve, appendiceal orifice, and rectum were                            photographed. The bowel preparation used was                            Miralax. Scope In: 2:31:17 PM Scope Out: 2:49:09 PM Scope Withdrawal Time: 0 hours 14 minutes 44 seconds  Total Procedure Duration: 0 hours 17 minutes 52 seconds  Findings:                 The perianal and digital rectal examinations were                            normal.                           Three sessile polyps were found in the descending                            colon, transverse colon and ascending colon. The                            polyps were 3 to 6 mm in size. These polyps were                            removed with a cold snare. Resection and retrieval  were complete. Verification of patient                            identification for the specimen was done. Estimated                            blood loss was minimal.                           Multiple diverticula were found in the sigmoid                            colon.                           The exam was otherwise without abnormality on                            direct and retroflexion views. Complications:            No immediate complications. Impression:               - Three 3 to 6 mm polyps in the descending colon,                            in the transverse colon and in the ascending colon,                            removed with a cold snare. Resected and retrieved.                           - Diverticulosis in the sigmoid colon.                           - The  examination was otherwise normal on direct                            and retroflexion views.                           - Personal history of colonic polyps. 3 diminutive                            adenomas 2016 Recommendation:           - Patient has a contact number available for                            emergencies. The signs and symptoms of potential                            delayed complications were discussed with the                            patient. Return to normal activities tomorrow.  Written discharge instructions were provided to the                            patient.                           - Resume previous diet.                           - Continue present medications.                           - No repeat colonoscopy likely due to age and                            overall hx/findings. Gatha Mayer, MD 05/16/2018 3:04:20 PM This report has been signed electronically.

## 2018-05-16 NOTE — Progress Notes (Signed)
To PACU, VSS. Report to [email or web address] 

## 2018-05-16 NOTE — Progress Notes (Signed)
Called to room to assist during endoscopic procedure.  Patient ID and intended procedure confirmed with present staff. Received instructions for my participation in the procedure from the performing physician.  

## 2018-05-17 ENCOUNTER — Telehealth: Payer: Self-pay

## 2018-05-17 NOTE — Telephone Encounter (Signed)
  Follow up Call-  Call back number 05/16/2018  Post procedure Call Back phone  # [phone]  Permission to leave phone message Yes  Some recent data might be hidden     Patient questions:  Do you have a fever, pain , or abdominal swelling? No. Pain Score  0 *  Have you tolerated food without any problems? Yes.    Have you been able to return to your normal activities? Yes.    Do you have any questions about your discharge instructions: Diet   No. Medications  No. Follow up visit  No.  Do you have questions or concerns about your Care? No.  Actions: * If pain score is 4 or above: No action needed, pain <4.

## 2018-05-27 ENCOUNTER — Encounter: Payer: Self-pay | Admitting: Internal Medicine

## 2018-05-27 DIAGNOSIS — Z86010 Personal history of colonic polyps: Secondary | ICD-10-CM

## 2018-05-27 NOTE — Progress Notes (Signed)
3 small adenomas max 6 mm no recall (age)

## 2018-06-07 ENCOUNTER — Ambulatory Visit: Payer: Self-pay

## 2018-06-14 ENCOUNTER — Other Ambulatory Visit: Payer: Self-pay | Admitting: Internal Medicine

## 2018-07-16 ENCOUNTER — Other Ambulatory Visit: Payer: Self-pay | Admitting: Internal Medicine

## 2018-08-01 ENCOUNTER — Encounter: Payer: Self-pay | Admitting: Internal Medicine

## 2018-08-08 ENCOUNTER — Other Ambulatory Visit: Payer: Self-pay

## 2018-08-08 ENCOUNTER — Ambulatory Visit: Payer: Medicare PPO | Admitting: Internal Medicine

## 2018-08-08 ENCOUNTER — Encounter: Payer: Self-pay | Admitting: Internal Medicine

## 2018-08-08 VITALS — BP 134/68 | HR 52 | Temp 97.5°F | Resp 16 | Ht 67.0 in | Wt 194.0 lb

## 2018-08-08 DIAGNOSIS — I10 Essential (primary) hypertension: Secondary | ICD-10-CM

## 2018-08-08 DIAGNOSIS — N390 Urinary tract infection, site not specified: Secondary | ICD-10-CM | POA: Diagnosis not present

## 2018-08-08 DIAGNOSIS — E039 Hypothyroidism, unspecified: Secondary | ICD-10-CM | POA: Diagnosis not present

## 2018-08-08 DIAGNOSIS — R7309 Other abnormal glucose: Secondary | ICD-10-CM | POA: Diagnosis not present

## 2018-08-08 DIAGNOSIS — E782 Mixed hyperlipidemia: Secondary | ICD-10-CM | POA: Diagnosis not present

## 2018-08-08 DIAGNOSIS — Z79899 Other long term (current) drug therapy: Secondary | ICD-10-CM

## 2018-08-08 DIAGNOSIS — E559 Vitamin D deficiency, unspecified: Secondary | ICD-10-CM | POA: Diagnosis not present

## 2018-08-08 DIAGNOSIS — Z6830 Body mass index (BMI) 30.0-30.9, adult: Secondary | ICD-10-CM

## 2018-08-08 DIAGNOSIS — E6609 Other obesity due to excess calories: Secondary | ICD-10-CM | POA: Diagnosis not present

## 2018-08-08 NOTE — Patient Instructions (Signed)

## 2018-08-08 NOTE — Progress Notes (Signed)
This very nice 78 y.o. MWM presents for 6 month follow up with HTN, HLD, Pre-Diabetes, DJD  and Vitamin D Deficiency.      Patient has lost 36# over the last 6 months since August with BMI dropping from 37+ to current BMI 30+.      Patient is treated for HTN & BP has been controlled at home. Todays BP is at goal - 134/68. Patient has had no complaints of any cardiac type chest pain, palpitations, dyspnea / orthopnea / PND, dizziness, claudication, or dependent edema.     Hyperlipidemia is not controlled with diet & meds.  Patient has  Rx for Crestor and it's felt that he's non compliant. Patient denies myalgias or other med SEs. Last Lipids were not at goal: Lab Results  Component Value Date   CHOL 219 (H) 05/02/2018   HDL 37 (L) 05/02/2018   LDLCALC 155 (H) 05/02/2018   TRIG 148 05/02/2018   CHOLHDL 5.9 (H) 05/02/2018      Also, the patient has Morbid Obesity (BMI 33+) and  history of PreDiabetes and has had no symptoms of reactive hypoglycemia, diabetic polys, paresthesias or visual blurring.  Last A1c was  Lab Results  Component Value Date   HGBA1C 5.0 01/18/2018      Further, the patient also has history of Vitamin D Deficiency and supplements vitamin D without any suspected side-effects. Last vitamin D was at goal: Lab Results  Component Value Date   VD25OH 93 01/18/2018   Current Outpatient Medications on File Prior to Visit  Medication Sig   ACCU-CHEK FASTCLIX LANCETS MISC CHECK  BLOOD  SUGAR ONE TIME DAILY   ACCU-CHEK SMARTVIEW test strip CHECK  BLOOD  SUGAR ONE TIME DAILY   allopurinol (ZYLOPRIM) 300 MG tablet TAKE 1 TABLET EVERY DAY   Ascorbic Acid (VITAMIN C PO) Take 2,000 mg by mouth daily.    atenolol (TENORMIN) 100 MG tablet TAKE 1 TABLET EVERY DAY   Blood Glucose Calibration (ACCU-CHEK SMARTVIEW CONTROL) LIQD Check blood sugar 1 time daily-DX-R73.03   Blood Glucose Monitoring Suppl (ACCU-CHEK NANO SMARTVIEW) w/Device KIT Check blood sugar 1 time  daily-DX-R73.03   Cholecalciferol (VITAMIN D PO) Take 10,000 Int'l Units by mouth daily.   finasteride (PROSCAR) 5 MG tablet Take 1 tablet (5 mg total) by mouth daily.   levothyroxine (SYNTHROID, LEVOTHROID) 200 MCG tablet Take 200 mcg by mouth daily before breakfast.   losartan (COZAAR) 50 MG tablet Take 1 tablet daily for BP   MAGNESIUM PO Take 250 mg by mouth 3 (three) times daily.    Omega-3 Fatty Acids (FISH OIL PO) Take 1,000 mg by mouth daily.    phentermine (ADIPEX-P) 37.5 MG tablet Take 37.5 mg by mouth daily before breakfast. Take 1/2 to 1 tablet daily for weight loss.   predniSONE (DELTASONE) 10 MG tablet TAKE 1 TABLET BY MOUTH ONCE DAILY   rosuvastatin (CRESTOR) 40 MG tablet Take 1/2 to 1 tablet daily or as directed for Cholesterol   No current facility-administered medications on file prior to visit.    Allergies  Allergen Reactions   Lotensin [Benazepril Hcl]     unknown   Penicillins Rash   PMHx:   Past Medical History:  Diagnosis Date   BPH with elevated PSA    Cataracts, bilateral    Diverticulosis    Gout    Hx of adenomatous colonic polyps 06/18/2014   Hyperlipidemia    Hypertension    Prediabetes    Thyroid  disease    Tubular adenoma 08/22/6145   Umbilical hernia 02/24/5746   Immunization History  Administered Date(s) Administered   Pneumococcal Polysaccharide-23 08/19/2008   Td 09/07/2009   Past Surgical History:  Procedure Laterality Date   CATARACT EXTRACTION Right 2009   COLONOSCOPY W/ BIOPSIES     SHOULDER ARTHROSCOPY Right    1979   FHx:    Reviewed / unchanged  SHx:    Reviewed / unchanged   Systems Review:  Constitutional: Denies fever, chills, wt changes, headaches, insomnia, fatigue, night sweats, change in appetite. Eyes: Denies redness, blurred vision, diplopia, discharge, itchy, watery eyes.  ENT: Denies discharge, congestion, post nasal drip, epistaxis, sore throat, earache, hearing loss, dental pain,  tinnitus, vertigo, sinus pain, snoring.  CV: Denies chest pain, palpitations, irregular heartbeat, syncope, dyspnea, diaphoresis, orthopnea, PND, claudication or edema. Respiratory: denies cough, dyspnea, DOE, pleurisy, hoarseness, laryngitis, wheezing.  Gastrointestinal: Denies dysphagia, odynophagia, heartburn, reflux, water brash, abdominal pain or cramps, nausea, vomiting, bloating, diarrhea, constipation, hematemesis, melena, hematochezia  or hemorrhoids. Genitourinary: Denies dysuria, frequency, urgency, nocturia, hesitancy, discharge, hematuria or flank pain. Musculoskeletal: Denies arthralgias, myalgias, stiffness, jt. swelling, pain, limping or strain/sprain.  Skin: Denies pruritus, rash, hives, warts, acne, eczema or change in skin lesion(s). Neuro: No weakness, tremor, incoordination, spasms, paresthesia or pain. Psychiatric: Denies confusion, memory loss or sensory loss. Endo: Denies change in weight, skin or hair change.  Heme/Lymph: No excessive bleeding, bruising or enlarged lymph nodes.  Physical Exam  BP 134/68    Pulse (!) 52    Temp (!) 97.5 F (36.4 C)    Resp 16    Ht '5\' 7"'  (1.702 m)    Wt 194 lb (88 kg)    BMI 30.38 kg/m   Appears  well nourished, well groomed  and in no distress.  Eyes: PERRLA, EOMs, conjunctiva no swelling or erythema. Sinuses: No frontal/maxillary tenderness ENT/Mouth: EAC's clear, TM's nl w/o erythema, bulging. Nares clear w/o erythema, swelling, exudates. Oropharynx clear without erythema or exudates. Oral hygiene is good. Tongue normal, non obstructing. Hearing intact.  Neck: Supple. Thyroid not palpable. Car 2+/2+ without bruits, nodes or JVD. Chest: Respirations nl with BS clear & equal w/o rales, rhonchi, wheezing or stridor.  Cor: Heart sounds normal w/ regular rate and rhythm without sig. murmurs, gallops, clicks or rubs. Peripheral pulses normal and equal  without edema.  Abdomen: Soft & bowel sounds normal. Non-tender w/o guarding,  rebound, hernias, masses or organomegaly.  Lymphatics: Unremarkable.  Musculoskeletal: Full ROM all peripheral extremities, joint stability, 5/5 strength and normal gait.  Skin: Warm, dry without exposed rashes, lesions or ecchymosis apparent.  Neuro: Cranial nerves intact, reflexes equal bilaterally. Sensory-motor testing grossly intact. Tendon reflexes grossly intact.  Pysch: Alert & oriented x 3.  Insight and judgement nl & appropriate. No ideations.  Assessment and Plan:  1. Essential hypertension  - Continue medication, monitor blood pressure at home.  - Continue DASH diet.  Reminder to go to the ER if any CP,  SOB, nausea, dizziness, severe HA, changes vision/speech.  - CBC with Differential/Platelet - COMPLETE METABOLIC PANEL WITH GFR - Magnesium - TSH  2. Hyperlipidemia, mixed  - Continue diet/meds, exercise,& lifestyle modifications.  - Continue monitor periodic cholesterol/liver & renal functions   - Lipid panel - TSH  3. Abnormal glucose  - Continue diet, exercise  - Lifestyle modifications.  - Monitor appropriate labs.  - Hemoglobin A1c - Insulin, random  4. Vitamin D deficiency  - Continue supplementation.  -  VITAMIN D 25 Hydroxyl  5. Hypothyroidism, unspecified type  - TSH  6. Medication management  - CBC with Differential/Platelet - COMPLETE METABOLIC PANEL WITH GFR - Magnesium - Lipid panel - TSH - Hemoglobin A1c - Insulin, random - VITAMIN D 25 Hydroxy (Vit-D Deficiency, Fractures)  7. Urinary tract infection without hematuria  - Urinalysis, Routine w reflex microscopic - Urine Culture       Discussed  regular exercise, BP monitoring, weight control to achieve/maintain BMI less than 25 and discussed med and SE's. Recommended labs to assess and monitor clinical status with further disposition pending results of labs. Over 30 minutes of exam, counseling, chart review was performed.

## 2018-08-09 ENCOUNTER — Other Ambulatory Visit: Payer: Self-pay | Admitting: Internal Medicine

## 2018-08-09 DIAGNOSIS — N390 Urinary tract infection, site not specified: Secondary | ICD-10-CM

## 2018-08-09 DIAGNOSIS — R319 Hematuria, unspecified: Principal | ICD-10-CM

## 2018-08-09 LAB — URINALYSIS, ROUTINE W REFLEX MICROSCOPIC
BILIRUBIN URINE: NEGATIVE
Bacteria, UA: NONE SEEN /HPF
GLUCOSE, UA: NEGATIVE
Ketones, ur: NEGATIVE
Nitrite: NEGATIVE
Specific Gravity, Urine: 1.027 (ref 1.001–1.03)
Squamous Epithelial / HPF: NONE SEEN /HPF (ref <=5)
pH: <=5 (ref 5.0–8.0)

## 2018-08-09 LAB — COMPLETE METABOLIC PANEL WITH GFR
AG Ratio: 1.7 (calc) (ref 1.0–2.5)
ALT: 20 U/L (ref 9–46)
AST: 22 U/L (ref 10–35)
Albumin: 4 g/dL (ref 3.6–5.1)
Alkaline phosphatase (APISO): 82 U/L (ref 35–144)
BUN: 16 mg/dL (ref 7–25)
CO2: 28 mmol/L (ref 20–32)
CREATININE: 0.81 mg/dL (ref 0.70–1.18)
Calcium: 9.6 mg/dL (ref 8.6–10.3)
Chloride: 105 mmol/L (ref 98–110)
GFR, Est African American: 101 mL/min/{1.73_m2} (ref >=60)
GFR, Est Non African American: 87 mL/min/{1.73_m2} (ref >=60)
Globulin: 2.3 g/dL (calc) (ref 1.9–3.7)
Glucose, Bld: 93 mg/dL (ref 65–99)
Potassium: 4 mmol/L (ref 3.5–5.3)
SODIUM: 141 mmol/L (ref 135–146)
Total Bilirubin: 0.9 mg/dL (ref 0.2–1.2)
Total Protein: 6.3 g/dL (ref 6.1–8.1)

## 2018-08-09 LAB — CBC WITH DIFFERENTIAL/PLATELET
Absolute Monocytes: 460 cells/uL (ref 200–950)
Basophils Absolute: 30 cells/uL (ref 0–200)
Basophils Relative: 0.5 %
Eosinophils Absolute: 236 cells/uL (ref 15–500)
Eosinophils Relative: 4 %
HCT: 40.8 % (ref 38.5–50.0)
Hemoglobin: 13.9 g/dL (ref 13.2–17.1)
Lymphs Abs: 1345 cells/uL (ref 850–3900)
MCH: 29.7 pg (ref 27.0–33.0)
MCHC: 34.1 g/dL (ref 32.0–36.0)
MCV: 87.2 fL (ref 80.0–100.0)
MPV: 10.9 fL (ref 7.5–12.5)
Monocytes Relative: 7.8 %
Neutro Abs: 3829 cells/uL (ref 1500–7800)
Neutrophils Relative %: 64.9 %
PLATELETS: 225 10*3/uL (ref 140–400)
RBC: 4.68 10*6/uL (ref 4.20–5.80)
RDW: 13.2 % (ref 11.0–15.0)
TOTAL LYMPHOCYTE: 22.8 %
WBC: 5.9 10*3/uL (ref 3.8–10.8)

## 2018-08-09 LAB — LIPID PANEL
Cholesterol: 198 mg/dL (ref <200)
HDL: 38 mg/dL — ABNORMAL LOW (ref >=40)
LDL Cholesterol (Calc): 140 mg/dL (calc) — ABNORMAL HIGH
Non-HDL Cholesterol (Calc): 160 mg/dL (calc) — ABNORMAL HIGH (ref <130)
Total CHOL/HDL Ratio: 5.2 (calc) — ABNORMAL HIGH (ref <5.0)
Triglycerides: 91 mg/dL (ref <150)

## 2018-08-09 LAB — URINE CULTURE
MICRO NUMBER:: 312320
SPECIMEN QUALITY:: ADEQUATE

## 2018-08-09 LAB — TSH: TSH: 0.01 mIU/L — ABNORMAL LOW (ref 0.40–4.50)

## 2018-08-09 LAB — INSULIN, RANDOM: Insulin: 3.6 u[IU]/mL

## 2018-08-09 LAB — HEMOGLOBIN A1C
Hgb A1c MFr Bld: 4.8 % of total Hgb (ref <5.7)
Mean Plasma Glucose: 91 (calc)
eAG (mmol/L): 5 (calc)

## 2018-08-09 LAB — MAGNESIUM: Magnesium: 1.8 mg/dL (ref 1.5–2.5)

## 2018-08-09 LAB — VITAMIN D 25 HYDROXY (VIT D DEFICIENCY, FRACTURES): Vit D, 25-Hydroxy: 88 ng/mL (ref 30–100)

## 2018-08-10 ENCOUNTER — Encounter: Payer: Self-pay | Admitting: Internal Medicine

## 2018-08-12 ENCOUNTER — Encounter: Payer: Self-pay | Admitting: *Deleted

## 2018-08-15 ENCOUNTER — Other Ambulatory Visit: Payer: Self-pay | Admitting: Internal Medicine

## 2018-10-02 ENCOUNTER — Other Ambulatory Visit: Payer: Self-pay | Admitting: Internal Medicine

## 2018-10-30 ENCOUNTER — Telehealth: Payer: Self-pay | Admitting: *Deleted

## 2018-10-30 NOTE — Telephone Encounter (Signed)
Patient called and questioned why his Prednisone 20 mg has been reduced to 10 mg every other day.  Per Dr Melford Aase, the patient should be taking 10 mg every other day and should be trying to stop the medication, since long term use is not advised. Patient is aware.

## 2018-11-02 ENCOUNTER — Other Ambulatory Visit: Payer: Self-pay | Admitting: Internal Medicine

## 2018-11-08 NOTE — Progress Notes (Signed)
MEDICARE ANNUAL WELLNESS VISIT AND FOLLOW UP Assessment:   Encounter for Medicare annual wellness exam 1 year  Essential hypertension - continue medications, DASH diet, exercise and monitor at home. Call if greater than 130/80.  -     CBC with Differential/Platelet -     COMPLETE METABOLIC PANEL WITH GFR -     TSH  Aortic atherosclerosis (HCC) Control blood pressure, cholesterol, glucose, increase exercise.   Morbid obesity ([place]) - follow up 3 months for progress monitoring - increase veggies, decrease carbs - long discussion about weight loss, diet, and exercise  Polymyalgia rheumatica (HCC) Continue to monitor States no fever, chills Having some joint pain Will resend in low dose prednisone, reviewed the risk of prednisone use including addison's, cushings, such as diabetes, low bone mass, fat redistribution, eye issues, etc.  Patient understands risks, will not take prednisone continuous, only 30 given to monitor use.   Hypothyroidism, unspecified type -     TSH - he is on 1/2 pill daily  Mixed hyperlipidemia check lipids decrease fatty foods increase activity.  -     Lipid panel  Other abnormal glucose Discussed disease progression and risks Discussed diet/exercise, weight management and risk modification  Vitamin D deficiency Continue supplement  Medication management -     Magnesium  Idiopathic gout, unspecified chronicity, unspecified site Gout- recheck Uric acid as needed, Diet discussed, continue medications.  BPH with elevated PSA Checked recently, no symptoms  Hx of adenomatous colonic polyps Scheduled next week  Cataract of both eyes, unspecified cataract type Monitor  Primary osteoarthritis involving multiple joints Increase exercise  Iron deficiency anemia, unspecified iron deficiency anemia type -     Iron,Total/Total Iron Binding Cap  B12 deficiency -     Vitamin B12  BMI 30.0-30.9,adult - follow up 3 months for progress  monitoring - increase veggies, decrease carbs - long discussion about weight loss, diet, and exercise    Future Appointments  Date Time Provider [place]  02/18/2019 11:00 AM Unk Pinto, MD [facility] None     Plan:   During the course of the visit the patient was educated and counseled about appropriate screening and preventive services including:    Pneumococcal vaccine   Influenza vaccine  Td vaccine  Screening electrocardiogram  Colorectal cancer screening  Diabetes screening  Glaucoma screening  Nutrition counseling    Subjective:  Andrew Cochran is a 78 y.o. male who presents for Medicare Annual Wellness Visit and 3 month follow up for HTN, hyperlipidemia, prediabetes, and vitamin D Def.   He has history of nonfocal TIA in 2008, no sequela. States memory is unchanged, some short term memory issues. No incontinence.   His blood pressure has been controlled at home, today their BP is BP: 130/80 He does not workout. He denies chest pain, shortness of breath, dizziness.   BMI is Body mass index is 30.85 kg/m., he is working on diet and exercise. Marland Kitchen He states his appetite has been less than normal. BMI is normal. Denies any night sweats. AB [place] 2012 showed fatty liver. He had colonoscopy 2019. no diarrhea/constipation. No trouble swallowing. Last PSA 3.7 and last DRE normal. Thyroid has been low, changed last visit and weight is up.  Wt Readings from Last 3 Encounters:  11/12/18 197 lb (89.4 kg)  08/08/18 194 lb (88 kg)  05/16/18 207 lb (93.9 kg)   He has history of PMR x 2011, has been tapered off steroids, HOWEVER he would like to have have prednisone  on hand that he would take AS needed for gout or PMR. Does not take daily.   He is on cholesterol medication, he is on crestor 1/2 pill daily, he is not on zetia and denies myalgias. His cholesterol is not at goal. The cholesterol last visit was:   Lab Results  Component Value Date   CHOL 198  08/08/2018   HDL 38 (L) 08/08/2018   LDLCALC 140 (H) 08/08/2018   TRIG 91 08/08/2018   CHOLHDL 5.2 (H) 08/08/2018   Last A1C in the office was normal, did have A1C of 6.4 in 2011 but has decreased with exercise/diet:  Lab Results  Component Value Date   HGBA1C 4.8 08/08/2018   Patient is on Vitamin D supplement.   Lab Results  Component Value Date   VD25OH 40 08/08/2018     He is on thyroid medication. His medication was changed last visit. He is 1/2 pill a day at night.  Patient denies nervousness, palpitations and weight changes.  Lab Results  Component Value Date   TSH 0.01 (L) 08/08/2018  .  He has 3 kids, 5 grand kids and 1 great grand kid.  Patient is on allopurinol for gout and does not report a recent flare. Lab Results  Component Value Date   LABURIC 5.8 01/18/2018    Names of Other Physician/Practitioners you currently use: 1. [facility] here for primary care 2. Dr. Katy Fitch, eye doctor, Jan 2015, over due 3. [facility], dentist, 2020 Patient Care Team: Unk Pinto, MD as PCP - General (Internal Medicine) [place], [facility] Eduardo Osier., MD as Attending Physician (Urology) Gatha Mayer, MD as Consulting Physician (Gastroenterology) Clent Jacks, MD as Consulting Physician (Ophthalmology)  Medication Review: Current Outpatient Medications on File Prior to Visit  Medication Sig Dispense Refill  . ACCU-CHEK FASTCLIX LANCETS MISC CHECK  BLOOD  SUGAR ONE TIME DAILY 102 each 3  . ACCU-CHEK SMARTVIEW test strip CHECK  BLOOD  SUGAR ONE TIME DAILY 100 each 3  . allopurinol (ZYLOPRIM) 300 MG tablet TAKE 1 TABLET EVERY DAY 90 tablet 1  . Ascorbic Acid (VITAMIN C PO) Take 2,000 mg by mouth daily.     Marland Kitchen atenolol (TENORMIN) 100 MG tablet TAKE 1 TABLET EVERY DAY 90 tablet 1  . Blood Glucose Calibration (ACCU-CHEK SMARTVIEW CONTROL) LIQD Check blood sugar 1 time daily-DX-R73.03 1 each 3  . Blood Glucose Monitoring Suppl  (ACCU-CHEK NANO SMARTVIEW) w/Device KIT Check blood sugar 1 time daily-DX-R73.03 1 kit 0  . Cholecalciferol (VITAMIN D PO) Take 10,000 Int'l Units by mouth daily.    . finasteride (PROSCAR) 5 MG tablet Take 1 tablet (5 mg total) by mouth daily. 90 tablet 1  . levothyroxine (SYNTHROID, LEVOTHROID) 200 MCG tablet Take 200 mcg by mouth daily before breakfast.    . losartan (COZAAR) 50 MG tablet Take 1 tablet daily for BP 90 tablet 1  . MAGNESIUM PO Take 250 mg by mouth 3 (three) times daily.     . Omega-3 Fatty Acids (FISH OIL PO) Take 1,000 mg by mouth daily.     . predniSONE (DELTASONE) 10 MG tablet Take 1 tablet every other day - Must last 30 days 15 tablet 0  . rosuvastatin (CRESTOR) 40 MG tablet Take 1/2 to 1 tablet daily or as directed for Cholesterol 90 tablet 1   No current facility-administered medications on file prior to visit.     Current Problems (verified) Patient Active Problem List  Diagnosis Date Noted  . Aortic atherosclerosis ([place]) 04/10/2017  . Morbid obesity ([place]) 07/07/2014  . DJD  07/07/2014  . Polymyalgia rheumatica ([place]) 07/07/2014  . Hx of adenomatous colonic polyps 06/18/2014  . Other abnormal glucose 09/25/2013  . Vitamin D deficiency 09/25/2013  . Medication management 09/25/2013  . Cataracts, bilateral   . Gout   . Hypertension   . Hypothyroidism   . BPH with elevated PSA   . Hyperlipidemia      Immunization History  Administered Date(s) Administered  . Pneumococcal Polysaccharide-23 08/19/2008  . Td 09/07/2009    Preventative care: Last colonoscopy: 2019 Dr. Carlean Purl, + adenomas 3 years [place] AB 2012- had fatty liver US neck 2014 CXR 10/2013 CT head 10/2003  Prior vaccinations: TD or Tdap: 2011  Influenza: declines Pneumococcal: 2010 Prevnar 13: declines Shingles/Zostavax: declines  Allergies Allergies  Allergen Reactions  . Lotensin [Benazepril Hcl]     unknown  . Penicillins Rash    SURGICAL HISTORY He  has a past surgical  history that includes Cataract extraction (Right, 2009); Shoulder arthroscopy (Right); and Colonoscopy w/ biopsies. FAMILY HISTORY His family history includes COPD in his father; Cancer in his mother. SOCIAL HISTORY He  reports that he quit smoking about 59 years ago. He has never used smokeless tobacco. He reports that he does not drink alcohol or use drugs.  MEDICARE WELLNESS OBJECTIVES: Physical activity: Current Exercise Habits: The patient does not participate in regular exercise at present(walking some) Cardiac risk factors: Cardiac Risk Factors include: advanced age (>78mn, >>37women);dyslipidemia;hypertension;male gender;sedentary lifestyle;obesity (BMI >30kg/m2) Depression/mood screen:   [facility] 11/12[facility]  Decreased Interest 0  Down, Depressed, Hopeless 0  PHQ - 2 Score 0    ADLs:  In your present state of health, do you have any difficulty performing the following activities: 11/12/2018 08/10/2018  Hearing? N N  Vision? N N  Difficulty concentrating or making decisions? Y N  Walking or climbing stairs? N N  Dressing or bathing? N N  Doing errands, shopping? N N  Some recent data might be hidden    Cognitive Testing  Alert? Yes  Normal Appearance?Yes  Oriented to person? Yes  Place? Yes   Time? Yes  Recall of three objects?  2/3  Can perform simple calculations? Yes  Displays appropriate judgment?Yes  Can read the correct time from a watch face?Yes  EOL planning: Does Patient Have a Medical Advance Directive?: No Would patient like information on creating a medical advance directive?: No - Patient declined He does not have advanced directives, states wife will make the decision, if she passes will fill them out so 3 kids does not have to make choice  MMSE - MThroopExam 05/02/2018  Orientation to time 5  Orientation to Place 5  Registration 3  Attention/ Calculation 5  Recall 1  Language- name 2 objects 2  Language- repeat 1  Language-  follow 3 step command 3  Language- read & follow direction 1  Write a sentence 1  Copy design 1  Total score 28     Objective:   Blood pressure 130/80, pulse 66, temperature (!) 96.7 F (35.9 C), height _0  (1.702 m), weight 197 lb (89.4 kg), SpO2 96 %. Body mass index is 30.85 kg/m.  General appearance: alert, no distress, WD/WN, male HEENT: normocephalic, sclerae anicteric, TMs pearly, nares patent, no discharge or erythema, pharynx normal Oral cavity: MMM, no lesions Neck: supple, no lymphadenopathy, no thyromegaly, no masses Heart: RRR,  normal S1, S2, no murmurs Lungs: CTA bilaterally, no wheezes, rhonchi, or rales Abdomen: +bs, soft, non tender, non distended, no masses, no hepatomegaly, no splenomegaly Musculoskeletal: nontender, no swelling, no obvious deformity Extremities: no edema, no cyanosis, no clubbing Pulses: 2+ symmetric, upper and lower extremities, normal cap refill Neurological: alert, oriented x 3, CN2-12 intact, strength normal upper extremities and lower extremities, sensation normal throughout, DTRs 2+ throughout, no cerebellar signs, gait normal Psychiatric: normal affect, behavior normal, pleasant   Medicare Attestation I have personally reviewed: The patient's medical and social history Their use of alcohol, tobacco or illicit drugs Their current medications and supplements The patient's functional ability including ADLs,fall risks, home safety risks, cognitive, and hearing and visual impairment Diet and physical activities Evidence for depression or mood disorders  The patient's weight, height, BMI, and visual acuity have been recorded in the chart.  I have made referrals, counseling, and provided education to the patient based on review of the above and I have provided the patient with a written personalized care plan for preventive services.     Vicie Mutters, PA-C   11/12/2018

## 2018-11-12 ENCOUNTER — Encounter: Payer: Self-pay | Admitting: Physician Assistant

## 2018-11-12 ENCOUNTER — Ambulatory Visit: Payer: Medicare PPO | Admitting: Physician Assistant

## 2018-11-12 ENCOUNTER — Other Ambulatory Visit: Payer: Self-pay

## 2018-11-12 VITALS — BP 130/80 | HR 66 | Temp 96.7°F | Ht 67.0 in | Wt 197.0 lb

## 2018-11-12 DIAGNOSIS — M353 Polymyalgia rheumatica: Secondary | ICD-10-CM

## 2018-11-12 DIAGNOSIS — H269 Unspecified cataract: Secondary | ICD-10-CM

## 2018-11-12 DIAGNOSIS — E039 Hypothyroidism, unspecified: Secondary | ICD-10-CM | POA: Diagnosis not present

## 2018-11-12 DIAGNOSIS — E782 Mixed hyperlipidemia: Secondary | ICD-10-CM

## 2018-11-12 DIAGNOSIS — I10 Essential (primary) hypertension: Secondary | ICD-10-CM | POA: Diagnosis not present

## 2018-11-12 DIAGNOSIS — R7309 Other abnormal glucose: Secondary | ICD-10-CM

## 2018-11-12 DIAGNOSIS — E559 Vitamin D deficiency, unspecified: Secondary | ICD-10-CM

## 2018-11-12 DIAGNOSIS — Z0000 Encounter for general adult medical examination without abnormal findings: Secondary | ICD-10-CM

## 2018-11-12 DIAGNOSIS — Z86010 Personal history of colonic polyps: Secondary | ICD-10-CM

## 2018-11-12 DIAGNOSIS — R972 Elevated prostate specific antigen [PSA]: Secondary | ICD-10-CM

## 2018-11-12 DIAGNOSIS — Z0001 Encounter for general adult medical examination with abnormal findings: Secondary | ICD-10-CM | POA: Diagnosis not present

## 2018-11-12 DIAGNOSIS — Z79899 Other long term (current) drug therapy: Secondary | ICD-10-CM | POA: Diagnosis not present

## 2018-11-12 DIAGNOSIS — R6889 Other general symptoms and signs: Secondary | ICD-10-CM

## 2018-11-12 DIAGNOSIS — N400 Enlarged prostate without lower urinary tract symptoms: Secondary | ICD-10-CM

## 2018-11-12 DIAGNOSIS — M159 Polyosteoarthritis, unspecified: Secondary | ICD-10-CM

## 2018-11-12 DIAGNOSIS — M150 Primary generalized (osteo)arthritis: Secondary | ICD-10-CM

## 2018-11-12 DIAGNOSIS — I700 Atherosclerosis of aorta: Secondary | ICD-10-CM

## 2018-11-12 DIAGNOSIS — M1000 Idiopathic gout, unspecified site: Secondary | ICD-10-CM

## 2018-11-12 MED ORDER — PREDNISONE 10 MG PO TABS: ORAL_TABLET | ORAL | 0 refills | Status: DC

## 2018-11-12 NOTE — Patient Instructions (Signed)
When it comes to diets, agreement about the perfect plan isn't easy to find, even among the experts. Experts at the [employer] developed an idea known as the Healthy Eating Plate. Just imagine a plate divided into logical, healthy portions.  The emphasis is on diet quality:  Load up on vegetables and fruits - one-half of your plate: Aim for color and variety, and remember that potatoes don't count.  Go for whole grains - one-quarter of your plate: Whole wheat, barley, wheat berries, quinoa, oats, brown rice, and foods made with them. If you want pasta, go with whole wheat pasta.  Protein power - one-quarter of your plate: Fish, chicken, beans, and nuts are all healthy, versatile protein sources. Limit red meat.  The diet, however, does go beyond the plate, offering a few other suggestions.  Use healthy plant oils, such as olive, canola, soy, corn, sunflower and peanut. Check the labels, and avoid partially hydrogenated oil, which have unhealthy trans fats.  If you're thirsty, drink water. Coffee and tea are good in moderation, but skip sugary drinks and limit milk and dairy products to one or two daily servings.  The type of carbohydrate in the diet is more important than the amount. Some sources of carbohydrates, such as vegetables, fruits, whole grains, and beans-are healthier than others.  Finally, stay active.  Prednisone tablets What is this medicine? PREDNISONE (PRED ni sone) is a corticosteroid. It is commonly used to treat inflammation of the skin, joints, lungs, and other organs. Common conditions treated include asthma, allergies, and arthritis. It is also used for other conditions, such as blood disorders and diseases of the adrenal glands. This medicine may be used for other purposes; ask your health care provider or pharmacist if you have questions. COMMON BRAND NAME(S): Deltasone, Predone, Sterapred, Sterapred DS What should I tell my health care  provider before I take this medicine? They need to know if you have any of these conditions: -Cushing's syndrome -diabetes -glaucoma -heart disease -high blood pressure -infection (especially a virus infection such as chickenpox, cold sores, or herpes) -kidney disease -liver disease -mental illness -myasthenia gravis -osteoporosis -seizures -stomach or intestine problems -thyroid disease -an unusual or allergic reaction to lactose, prednisone, other medicines, foods, dyes, or preservatives -pregnant or trying to get pregnant -breast-feeding How should I use this medicine? Take this medicine by mouth with a glass of water. Follow the directions on the prescription label. Take this medicine with food. If you are taking this medicine once a day, take it in the morning. Do not take more medicine than you are told to take. Do not suddenly stop taking your medicine because you may develop a severe reaction. Your doctor will tell you how much medicine to take. If your doctor wants you to stop the medicine, the dose may be slowly lowered over time to avoid any side effects. Talk to your pediatrician regarding the use of this medicine in children. Special care may be needed. Overdosage: If you think you have taken too much of this medicine contact a poison control center or emergency room at once. NOTE: This medicine is only for you. Do not share this medicine with others. What if I miss a dose? If you miss a dose, take it as soon as you can. If it is almost time for your next dose, talk to your doctor or health care professional. You may need to miss a dose or take an extra dose. Do not take  double or extra doses without advice. What may interact with this medicine? Do not take this medicine with any of the following medications: -metyrapone -mifepristone This medicine may also interact with the following medications: -aminoglutethimide -amphotericin B -aspirin and aspirin-like medicines  -barbiturates -certain medicines for diabetes, like glipizide or glyburide -cholestyramine -cholinesterase inhibitors -cyclosporine -digoxin -diuretics -ephedrine -male hormones, like estrogens and birth control pills -isoniazid -ketoconazole -NSAIDS, medicines for pain and inflammation, like ibuprofen or naproxen -phenytoin -rifampin -toxoids -vaccines -warfarin This list may not describe all possible interactions. Give your health care provider a list of all the medicines, herbs, non-prescription drugs, or dietary supplements you use. Also tell them if you smoke, drink alcohol, or use illegal drugs. Some items may interact with your medicine. What should I watch for while using this medicine? Visit your doctor or health care professional for regular checks on your progress. If you are taking this medicine over a prolonged period, carry an identification card with your name and address, the type and dose of your medicine, and your doctor's name and address. This medicine may increase your risk of getting an infection. Tell your doctor or health care professional if you are around anyone with measles or chickenpox, or if you develop sores or blisters that do not heal properly. If you are going to have surgery, tell your doctor or health care professional that you have taken this medicine within the last twelve months. Ask your doctor or health care professional about your diet. You may need to lower the amount of salt you eat. This medicine may increase blood sugar. Ask your healthcare provider if changes in diet or medicines are needed if you have diabetes. What side effects may I notice from receiving this medicine? Side effects that you should report to your doctor or health care professional as soon as possible: -allergic reactions like skin rash, itching or hives, swelling of the face, lips, or tongue -changes in emotions or moods -changes in vision -depressed mood -eye pain  -fever or chills, cough, sore throat, pain or difficulty passing urine -signs and symptoms of high blood sugar such as being more thirsty or hungry or having to urinate more than normal. You may also feel very tired or have blurry vision. -swelling of ankles, feet Side effects that usually do not require medical attention (report to your doctor or health care professional if they continue or are bothersome): -confusion, excitement, restlessness -headache -nausea, vomiting -skin problems, acne, thin and shiny skin -trouble sleeping -weight gain This list may not describe all possible side effects. Call your doctor for medical advice about side effects. You may report side effects to FDA at 1-800-FDA-1088. Where should I keep my medicine? Keep out of the reach of children. Store at room temperature between 15 and 30 degrees C (59 and 86 degrees F). Protect from light. Keep container tightly closed. Throw away any unused medicine after the expiration date. NOTE: This sheet is a summary. It may not cover all possible information. If you have questions about this medicine, talk to your doctor, pharmacist, or health care provider.  2019 [employer]vier/Gold Standard (2018-02-12 10:54:22)

## 2018-11-13 ENCOUNTER — Encounter: Payer: Self-pay | Admitting: Physician Assistant

## 2018-11-13 LAB — COMPLETE METABOLIC PANEL WITH GFR
AG Ratio: 1.9 (calc) (ref 1.0–2.5)
ALT: 23 U/L (ref 9–46)
AST: 24 U/L (ref 10–35)
Albumin: 4.3 g/dL (ref 3.6–5.1)
Alkaline phosphatase (APISO): 82 U/L (ref 35–144)
BUN: 16 mg/dL (ref 7–25)
CO2: 31 mmol/L (ref 20–32)
Calcium: 9.3 mg/dL (ref 8.6–10.3)
Chloride: 104 mmol/L (ref 98–110)
Creat: 0.88 mg/dL (ref 0.70–1.18)
GFR, Est African American: 97 mL/min/{1.73_m2} (ref >=60)
GFR, Est Non African American: 83 mL/min/{1.73_m2} (ref >=60)
Globulin: 2.3 g/dL (calc) (ref 1.9–3.7)
Glucose, Bld: 101 mg/dL — ABNORMAL HIGH (ref 65–99)
Potassium: 4.1 mmol/L (ref 3.5–5.3)
Sodium: 142 mmol/L (ref 135–146)
Total Bilirubin: 0.7 mg/dL (ref 0.2–1.2)
Total Protein: 6.6 g/dL (ref 6.1–8.1)

## 2018-11-13 LAB — LIPID PANEL
Cholesterol: 227 mg/dL — ABNORMAL HIGH (ref <200)
HDL: 52 mg/dL (ref >=40)
LDL Cholesterol (Calc): 157 mg/dL (calc) — ABNORMAL HIGH
Non-HDL Cholesterol (Calc): 175 mg/dL (calc) — ABNORMAL HIGH (ref <130)
Total CHOL/HDL Ratio: 4.4 (calc) (ref <5.0)
Triglycerides: 76 mg/dL (ref <150)

## 2018-11-13 LAB — CBC WITH DIFFERENTIAL/PLATELET
Absolute Monocytes: 442 cells/uL (ref 200–950)
Basophils Absolute: 48 cells/uL (ref 0–200)
Basophils Relative: 0.7 %
Eosinophils Absolute: 248 cells/uL (ref 15–500)
Eosinophils Relative: 3.6 %
HCT: 44.9 % (ref 38.5–50.0)
Hemoglobin: 15.7 g/dL (ref 13.2–17.1)
Lymphs Abs: 1504 cells/uL (ref 850–3900)
MCH: 32 pg (ref 27.0–33.0)
MCHC: 35 g/dL (ref 32.0–36.0)
MCV: 91.4 fL (ref 80.0–100.0)
MPV: 10.6 fL (ref 7.5–12.5)
Monocytes Relative: 6.4 %
Neutro Abs: 4658 cells/uL (ref 1500–7800)
Neutrophils Relative %: 67.5 %
Platelets: 207 10*3/uL (ref 140–400)
RBC: 4.91 10*6/uL (ref 4.20–5.80)
RDW: 13.7 % (ref 11.0–15.0)
Total Lymphocyte: 21.8 %
WBC: 6.9 10*3/uL (ref 3.8–10.8)

## 2018-11-13 LAB — MAGNESIUM: Magnesium: 1.7 mg/dL (ref 1.5–2.5)

## 2018-11-13 LAB — SEDIMENTATION RATE: Sed Rate: 2 mm/h (ref 0–20)

## 2018-11-13 LAB — TSH: TSH: 3.05 mIU/L (ref 0.40–4.50)

## 2019-01-08 ENCOUNTER — Other Ambulatory Visit: Payer: Self-pay | Admitting: Internal Medicine

## 2019-01-22 ENCOUNTER — Other Ambulatory Visit: Payer: Self-pay | Admitting: Physician Assistant

## 2019-02-08 ENCOUNTER — Other Ambulatory Visit: Payer: Self-pay | Admitting: Internal Medicine

## 2019-02-13 ENCOUNTER — Ambulatory Visit: Payer: Medicare PPO | Admitting: Internal Medicine

## 2019-02-17 ENCOUNTER — Encounter: Payer: Self-pay | Admitting: Internal Medicine

## 2019-02-17 NOTE — Progress Notes (Signed)
Annual  Screening/Preventative Visit  & Comprehensive Evaluation & Examination     This very nice 78 y.o. MWF presents for a Screening /Preventative Visit & comprehensive evaluation and management of multiple medical co-morbidities.  Patient has been followed for HTN, HLD, T2_NIDDM  and Vitamin D Deficiency.  Patient has Gout controlled on his meds.      HTN predates since 1997. Patient's BP has been controlled at home.  Today's BP is at goal - 140/70. Patient denies any cardiac symptoms as chest pain, palpitations, shortness of breath, dizziness or ankle swelling.     Patient's hyperlipidemia is controlled with diet and medications. Patient denies myalgias or other medication SE's. Last lipids were not at goal:  Lab Results  Component Value Date   CHOL 227 (H) 11/12/2018   HDL 52 11/12/2018   LDLCALC 157 (H) 11/12/2018   TRIG 76 11/12/2018   CHOLHDL 4.4 11/12/2018      Patient has Moderate Obesity (BMI 30+) and hx/o prediabetes(A1c 6.4% / 2011) and patient denies reactive hypoglycemic symptoms, visual blurring, diabetic polys or paresthesias. Last A1c was at goal:  Lab Results  Component Value Date   HGBA1C 4.8 08/08/2018        Patient was dx'd Hypothyroid in 2002 and has been on thyroid replacement since.     Finally, patient has history of Vitamin D Deficiency ("36" / 2008)  and last vitamin D was at goal:  Lab Results  Component Value Date   VD25OH 88 08/08/2018   Current Outpatient Medications on File Prior to Visit  Medication Sig  . ACCU-CHEK FASTCLIX LANCETS MISC CHECK  BLOOD  SUGAR ONE TIME DAILY  . ACCU-CHEK SMARTVIEW test strip CHECK  BLOOD  SUGAR ONE TIME DAILY  . allopurinol (ZYLOPRIM) 300 MG tablet TAKE 1 TABLET EVERY DAY  . Ascorbic Acid (VITAMIN C PO) Take 2,000 mg by mouth daily.   Marland Kitchen atenolol (TENORMIN) 100 MG tablet TAKE 1 TABLET EVERY DAY  . Blood Glucose Calibration (ACCU-CHEK SMARTVIEW CONTROL) LIQD Check blood sugar 1 time daily-DX-R73.03  . Blood  Glucose Monitoring Suppl (ACCU-CHEK NANO SMARTVIEW) w/Device KIT Check blood sugar 1 time daily-DX-R73.03  . Cholecalciferol (VITAMIN D PO) Take 10,000 Int'l Units by mouth daily.  . finasteride (PROSCAR) 5 MG tablet Take 1 tablet (5 mg total) by mouth daily.  Marland Kitchen levothyroxine (SYNTHROID, LEVOTHROID) 200 MCG tablet Take 200 mcg by mouth daily before breakfast.  . MAGNESIUM PO Take 250 mg by mouth 3 (three) times daily.   . Omega-3 Fatty Acids (FISH OIL PO) Take 1,000 mg by mouth daily.   . predniSONE (DELTASONE) 10 MG tablet Take 1 tablet every other day - Must last 60 days  . losartan (COZAAR) 50 MG tablet Take 1 tablet daily for BP  . rosuvastatin (CRESTOR) 40 MG tablet Take 1/2 to 1 tablet daily or as directed for Cholesterol   No current facility-administered medications on file prior to visit.    Allergies  Allergen Reactions  . Lotensin [Benazepril Hcl]     unknown  . Penicillins Rash   Past Medical History:  Diagnosis Date  . BPH with elevated PSA   . Cataracts, bilateral   . Diverticulosis   . Gout   . Hx of adenomatous colonic polyps 06/18/2014  . Hyperlipidemia   . Hypertension   . Prediabetes   . Thyroid disease   . Tubular adenoma 02/17/2003  . Umbilical hernia 05/39/7673   Health Maintenance  Topic Date Due  . Samul Dada  09/08/2019  . COLONOSCOPY  05/16/2021  . INFLUENZA VACCINE  Discontinued  . PNA vac Low Risk Adult  Discontinued   Immunization History  Administered Date(s) Administered  . Pneumococcal Polysaccharide-23 08/19/2008  . Td 09/07/2009   Last Colon -   Past Surgical History:  Procedure Laterality Date  . CATARACT EXTRACTION Right 2009  . COLONOSCOPY W/ BIOPSIES    . SHOULDER ARTHROSCOPY Right    1979   Family History  Problem Relation Age of Onset  . Cancer Mother   . COPD Father   . Colon cancer Neg Hx   . Esophageal cancer Neg Hx   . Rectal cancer Neg Hx   . Stomach cancer Neg Hx    Social History   Socioeconomic History  .  Marital status: Married    Spouse name: Not on file  . Number of children: 1 son & 2 daughters  Occupational History  . Retired Freight forwarder  Tobacco Use  . Smoking status: Former Smoker    Quit date: 05/30/1959    Years since quitting: 59.7  . Smokeless tobacco: Never Used  Substance and Sexual Activity  . Alcohol use: No  . Drug use: No  . Sexual activity: Not on file    ROS Constitutional: Denies fever, chills, weight loss/gain, headaches, insomnia,  night sweats or change in appetite. Does c/o fatigue. Eyes: Denies redness, blurred vision, diplopia, discharge, itchy or watery eyes.  ENT: Denies discharge, congestion, post nasal drip, epistaxis, sore throat, earache, hearing loss, dental pain, Tinnitus, Vertigo, Sinus pain or snoring.  Cardio: Denies chest pain, palpitations, irregular heartbeat, syncope, dyspnea, diaphoresis, orthopnea, PND, claudication or edema Respiratory: denies cough, dyspnea, DOE, pleurisy, hoarseness, laryngitis or wheezing.  Gastrointestinal: Denies dysphagia, heartburn, reflux, water brash, pain, cramps, nausea, vomiting, bloating, diarrhea, constipation, hematemesis, melena, hematochezia, jaundice or hemorrhoids Genitourinary: Denies dysuria, frequency, urgency, nocturia, hesitancy, discharge, hematuria or flank pain Musculoskeletal: Denies arthralgia, myalgia, stiffness, Jt. Swelling, pain, limp or strain/sprain. Denies Falls. Skin: Denies puritis, rash, hives, warts, acne, eczema or change in skin lesion Neuro: No weakness, tremor, incoordination, spasms, paresthesia or pain Psychiatric: Denies confusion, memory loss or sensory loss. Denies Depression. Endocrine: Denies change in weight, skin, hair change, nocturia, and paresthesia, diabetic polys, visual blurring or hyper / hypo glycemic episodes.  Heme/Lymph: No excessive bleeding, bruising or enlarged lymph nodes.  Physical Exam  BP 140/70   Pulse 68   Temp 97.6 F (36.4 C)   Ht 5' 6" (1.676 m)    Wt 190 lb 9.6 oz (86.5 kg)   SpO2 99%   BMI 30.76 kg/m   General Appearance: Over nourished and in no apparent distress.  Eyes: PERRLA, EOMs, conjunctiva no swelling or erythema, normal fundi and vessels. Sinuses: No frontal/maxillary tenderness ENT/Mouth: EACs patent / TMs  nl. Nares clear without erythema, swelling, mucoid exudates. Oral hygiene is good. No erythema, swelling, or exudate. Tongue normal, non-obstructing. Tonsils not swollen or erythematous. Hearing normal.  Neck: Supple, thyroid not palpable. No bruits, nodes or JVD. Respiratory: Respiratory effort normal.  BS equal and clear bilateral without rales, rhonci, wheezing or stridor. Cardio: Heart sounds are normal with regular rate and rhythm and no murmurs, rubs or gallops. Peripheral pulses are normal and equal bilaterally without edema. No aortic or femoral bruits. Chest: symmetric with normal excursions and percussion.  Abdomen: Soft, with Nl bowel sounds. Nontender, no guarding, rebound, hernias, masses, or organomegaly.  Lymphatics: Non tender without lymphadenopathy.  Musculoskeletal: Full ROM all peripheral extremities, joint stability, 5/5  strength, and normal gait. Skin: Warm and dry without rashes, lesions, cyanosis, clubbing or  ecchymosis.  Neuro: Cranial nerves intact, reflexes equal bilaterally. Normal muscle tone, no cerebellar symptoms. Sensation intact.  Pysch: Alert and oriented X 3 with normal affect, insight and judgment appropriate.   Assessment and Plan  1. Annual Preventative/Screening Exam   2. Essential hypertension  - EKG 12-Lead - [place], RETROPERITNL ABD,  LTD - Urinalysis, Routine w reflex microscopic - Microalbumin / creatinine urine ratio - CBC with Differential/Platelet - COMPLETE METABOLIC PANEL WITH GFR - Magnesium - TSH  3. Hyperlipidemia, mixed  - EKG 12-Lead - [place], RETROPERITNL ABD,  LTD - Lipid panel - TSH  4. Abnormal glucose  - EKG 12-Lead - [place], RETROPERITNL ABD,  LTD -  Hemoglobin A1c - Insulin, random  5. Vitamin D deficiency  - VITAMIN D 25 Hydroxy  6. Prediabetes  - EKG 12-Lead - [place], RETROPERITNL ABD,  LTD  7. Hypothyroidism  - TSH  8. Idiopathic gout  - Uric acid  9. BPH with obstruction/lower urinary tract symptoms  - PSA  10. Prostate cancer screening  - PSA  11. Screening for colorectal cancer  - POC Hemoccult Bld/Stl  12. Screening for ischemic heart disease  - EKG 12-Lead  13. FHx: heart disease  - EKG 12-Lead - [place], RETROPERITNL ABD,  LTD  14. Aortic atherosclerosis (HCC)  - [place], RETROPERITNL ABD,  LTD  15. Screening for AAA (aortic abdominal aneurysm)  - [place], RETROPERITNL ABD,  LTD  16. Medication management  - Urinalysis, Routine w reflex microscopic - Microalbumin / creatinine urine ratio - Uric acid - CBC with Differential/Platelet - COMPLETE METABOLIC PANEL WITH GFR - Magnesium - Lipid panel - TSH - Hemoglobin A1c - Insulin, random - VITAMIN D 25 Hydroxyl         Patient was counseled in prudent diet, weight control to achieve/maintain BMI less than 25, BP monitoring, regular exercise and medications as discussed.  Discussed med effects and SE's. Routine screening labs and tests as requested with regular follow-up as recommended. Over 40 minutes of exam, counseling, chart review and high complex critical decision making was performed   Kirtland Bouchard, MD

## 2019-02-17 NOTE — Patient Instructions (Signed)

## 2019-02-18 ENCOUNTER — Ambulatory Visit (INDEPENDENT_AMBULATORY_CARE_PROVIDER_SITE_OTHER): Payer: Medicare PPO | Admitting: Internal Medicine

## 2019-02-18 ENCOUNTER — Other Ambulatory Visit: Payer: Self-pay

## 2019-02-18 ENCOUNTER — Encounter: Payer: Self-pay | Admitting: Internal Medicine

## 2019-02-18 VITALS — BP 140/70 | HR 68 | Temp 97.6°F | Ht 66.0 in | Wt 190.6 lb

## 2019-02-18 DIAGNOSIS — E039 Hypothyroidism, unspecified: Secondary | ICD-10-CM | POA: Diagnosis not present

## 2019-02-18 DIAGNOSIS — Z125 Encounter for screening for malignant neoplasm of prostate: Secondary | ICD-10-CM | POA: Diagnosis not present

## 2019-02-18 DIAGNOSIS — Z0000 Encounter for general adult medical examination without abnormal findings: Secondary | ICD-10-CM | POA: Diagnosis not present

## 2019-02-18 DIAGNOSIS — Z136 Encounter for screening for cardiovascular disorders: Secondary | ICD-10-CM | POA: Diagnosis not present

## 2019-02-18 DIAGNOSIS — E782 Mixed hyperlipidemia: Secondary | ICD-10-CM

## 2019-02-18 DIAGNOSIS — R7303 Prediabetes: Secondary | ICD-10-CM

## 2019-02-18 DIAGNOSIS — M1000 Idiopathic gout, unspecified site: Secondary | ICD-10-CM | POA: Diagnosis not present

## 2019-02-18 DIAGNOSIS — I10 Essential (primary) hypertension: Secondary | ICD-10-CM | POA: Diagnosis not present

## 2019-02-18 DIAGNOSIS — N401 Enlarged prostate with lower urinary tract symptoms: Secondary | ICD-10-CM

## 2019-02-18 DIAGNOSIS — R7309 Other abnormal glucose: Secondary | ICD-10-CM | POA: Diagnosis not present

## 2019-02-18 DIAGNOSIS — Z1212 Encounter for screening for malignant neoplasm of rectum: Secondary | ICD-10-CM

## 2019-02-18 DIAGNOSIS — Z8249 Family history of ischemic heart disease and other diseases of the circulatory system: Secondary | ICD-10-CM

## 2019-02-18 DIAGNOSIS — N138 Other obstructive and reflux uropathy: Secondary | ICD-10-CM

## 2019-02-18 DIAGNOSIS — Z79899 Other long term (current) drug therapy: Secondary | ICD-10-CM

## 2019-02-18 DIAGNOSIS — I700 Atherosclerosis of aorta: Secondary | ICD-10-CM

## 2019-02-18 DIAGNOSIS — Z0001 Encounter for general adult medical examination with abnormal findings: Secondary | ICD-10-CM

## 2019-02-18 DIAGNOSIS — E559 Vitamin D deficiency, unspecified: Secondary | ICD-10-CM | POA: Diagnosis not present

## 2019-02-18 DIAGNOSIS — Z1211 Encounter for screening for malignant neoplasm of colon: Secondary | ICD-10-CM

## 2019-02-19 ENCOUNTER — Other Ambulatory Visit: Payer: Self-pay | Admitting: Internal Medicine

## 2019-02-19 DIAGNOSIS — R972 Elevated prostate specific antigen [PSA]: Secondary | ICD-10-CM

## 2019-02-19 LAB — LIPID PANEL
Cholesterol: 226 mg/dL — ABNORMAL HIGH (ref <200)
HDL: 48 mg/dL (ref >=40)
LDL Cholesterol (Calc): 154 mg/dL (calc) — ABNORMAL HIGH
Non-HDL Cholesterol (Calc): 178 mg/dL (calc) — ABNORMAL HIGH (ref <130)
Total CHOL/HDL Ratio: 4.7 (calc) (ref <5.0)
Triglycerides: 119 mg/dL (ref <150)

## 2019-02-19 LAB — CBC WITH DIFFERENTIAL/PLATELET
Absolute Monocytes: 402 cells/uL (ref 200–950)
Basophils Absolute: 54 cells/uL (ref 0–200)
Basophils Relative: 0.8 %
Eosinophils Absolute: 201 cells/uL (ref 15–500)
Eosinophils Relative: 3 %
HCT: 43.5 % (ref 38.5–50.0)
Hemoglobin: 15 g/dL (ref 13.2–17.1)
Lymphs Abs: 1441 cells/uL (ref 850–3900)
MCH: 31.5 pg (ref 27.0–33.0)
MCHC: 34.5 g/dL (ref 32.0–36.0)
MCV: 91.4 fL (ref 80.0–100.0)
MPV: 10.7 fL (ref 7.5–12.5)
Monocytes Relative: 6 %
Neutro Abs: 4603 cells/uL (ref 1500–7800)
Neutrophils Relative %: 68.7 %
Platelets: 196 10*3/uL (ref 140–400)
RBC: 4.76 10*6/uL (ref 4.20–5.80)
RDW: 12.9 % (ref 11.0–15.0)
Total Lymphocyte: 21.5 %
WBC: 6.7 10*3/uL (ref 3.8–10.8)

## 2019-02-19 LAB — URINALYSIS, ROUTINE W REFLEX MICROSCOPIC
Bacteria, UA: NONE SEEN /HPF
Bilirubin Urine: NEGATIVE
Glucose, UA: NEGATIVE
Hgb urine dipstick: NEGATIVE
Ketones, ur: NEGATIVE
Leukocytes,Ua: NEGATIVE
Nitrite: NEGATIVE
Specific Gravity, Urine: 1.014 (ref 1.001–1.03)
Squamous Epithelial / HPF: NONE SEEN /HPF (ref <=5)
pH: 6 (ref 5.0–8.0)

## 2019-02-19 LAB — COMPLETE METABOLIC PANEL WITH GFR
AG Ratio: 1.8 (calc) (ref 1.0–2.5)
ALT: 17 U/L (ref 9–46)
AST: 23 U/L (ref 10–35)
Albumin: 4.2 g/dL (ref 3.6–5.1)
Alkaline phosphatase (APISO): 77 U/L (ref 35–144)
BUN: 14 mg/dL (ref 7–25)
CO2: 29 mmol/L (ref 20–32)
Calcium: 9.5 mg/dL (ref 8.6–10.3)
Chloride: 104 mmol/L (ref 98–110)
Creat: 0.99 mg/dL (ref 0.70–1.18)
GFR, Est African American: 85 mL/min/{1.73_m2} (ref >=60)
GFR, Est Non African American: 74 mL/min/{1.73_m2} (ref >=60)
Globulin: 2.3 g/dL (calc) (ref 1.9–3.7)
Glucose, Bld: 97 mg/dL (ref 65–99)
Potassium: 3.8 mmol/L (ref 3.5–5.3)
Sodium: 141 mmol/L (ref 135–146)
Total Bilirubin: 0.8 mg/dL (ref 0.2–1.2)
Total Protein: 6.5 g/dL (ref 6.1–8.1)

## 2019-02-19 LAB — TSH: TSH: 2.97 mIU/L (ref 0.40–4.50)

## 2019-02-19 LAB — URIC ACID: Uric Acid, Serum: 6.5 mg/dL (ref 4.0–8.0)

## 2019-02-19 LAB — MICROALBUMIN / CREATININE URINE RATIO
Creatinine, Urine: 199 mg/dL (ref 20–320)
Microalb Creat Ratio: 236 mcg/mg creat — ABNORMAL HIGH (ref <30)
Microalb, Ur: 46.9 mg/dL

## 2019-02-19 LAB — HEMOGLOBIN A1C
Hgb A1c MFr Bld: 4.5 % of total Hgb (ref <5.7)
Mean Plasma Glucose: 82 (calc)
eAG (mmol/L): 4.6 (calc)

## 2019-02-19 LAB — INSULIN, RANDOM: Insulin: 6.3 u[IU]/mL

## 2019-02-19 LAB — PSA: PSA: 5.9 ng/mL — ABNORMAL HIGH (ref <=4.0)

## 2019-02-19 LAB — VITAMIN D 25 HYDROXY (VIT D DEFICIENCY, FRACTURES): Vit D, 25-Hydroxy: 90 ng/mL (ref 30–100)

## 2019-02-19 LAB — MAGNESIUM: Magnesium: 1.8 mg/dL (ref 1.5–2.5)

## 2019-02-20 ENCOUNTER — Encounter: Payer: Self-pay | Admitting: *Deleted

## 2019-04-07 ENCOUNTER — Other Ambulatory Visit: Payer: Self-pay | Admitting: Physician Assistant

## 2019-04-22 ENCOUNTER — Other Ambulatory Visit: Payer: Self-pay | Admitting: Adult Health

## 2019-05-06 ENCOUNTER — Ambulatory Visit: Payer: Self-pay | Admitting: Physician Assistant

## 2019-05-21 ENCOUNTER — Other Ambulatory Visit: Payer: Self-pay | Admitting: Internal Medicine

## 2019-05-26 NOTE — Progress Notes (Signed)
3 Month Follow Up   Assessment and Plan:   Andrew Cochran was seen today for follow-up.  Diagnoses and all orders for this visit:  Essential hypertension Hypertension Continue medication: Monitor blood pressure at home; call if consistently over 130/80 Continue DASH diet.   Reminder to go to the ER if any CP, SOB, nausea, dizziness, severe HA, changes vision/speech, left arm numbness and tingling and jaw pain. -     CBC with Diff -     COMPLETE METABOLIC PANEL WITH GFR -     Magnesium  Hyperlipidemia, mixed Continue medications: Discussed dietary and exercise modifications Low fat diet -     Lipid Profile  Abnormal glucose Discussed dietary and exercise modifications -     Hemoglobin A1c  -     Insulin, random  Vitamin D deficiency Continue supplementation Taking Vitamin D 10,000 IU daily -     Vitamin D (25 hydroxy)  Hypothyroidism, unspecified type Taking levothyroxine 200 mcg daily Reminder to take on an empty stomach 30-86mns before first meal of the day. No antacid medications for 4 hours. -     TSH  Idiopathic gout, unspecified chronicity, unspecified site Continue allopurinol 3034mdaily No recent flares Discussed dietary modifications Continue to monitor  BMI 30.0-39.9 Discussed dietary and exercise modifications  BPH with obstruction/lower urinary tract symptoms Doing well at this time Continue medications:  Will continue to monitor Defer PSA this check  Acute ankle pain, right Lateral Xerosis vs pressure? Discussed cleansing with washcloth Use vaseline, thick moisturizer Continue to monitor  Medication management Continued    Continue diet and meds as discussed. Further disposition pending results of labs. Discussed med's effects and SE's.  Patient agrees with plan of care and opportunity to ask questions/voice concerns. Over 30 minutes of chart review, face to face interview, exam, counseling, and critical decision making was performed.    Future Appointments  Date Time Provider DePierce3/30/2021 11:30 AM McUnk PintoMD [facility] None  11/19/2019 10:00 AM CoVicie MuttersPA-C [facility] None  03/17/2020  3:00 PM McUnk PintoMD [facility] None    ----------------------------------------------------------------------------------------------------------------------  HPI [age].o. male  presents for 3 month follow up on HTN, HLD, DMII controlled by diet and exercise, weight and vitamin D deficiency.    Reports his right ankle has been sore on outer aspect.  He has put neosporin on it.  Denies any trauma, falls or continued pressure to the area.  No open area, bleeding or exudate.  Denies numbness or tingling.  He is anble to ambulate without and pain or discomfort.  BMI is Body mass index is 30.01 kg/m., he has not been working on diet and exercise. Wt Readings from Last 3 Encounters:  05/27/19 191 lb 9.6 oz (86.9 kg)  02/18/19 190 lb 9.6 oz (86.5 kg)  11/12/18 197 lb (89.4 kg)     HTN predates 1997.  His blood pressure has been controlled at home, today their BP is BP: 140/80  He does not workout. He denies any cardiac symptoms, chest pains, palpitations, shortness of breath, dizziness or lower extremity edema.      He is on cholesterol medication Rosuvastatin and denies myalgias.   His cholesterol is not at goal. The cholesterol last visit was:   Lab Results  Component Value Date   CHOL 226 (H) 02/18/2019   HDL 48 02/18/2019   LDLCALC 154 (H) 02/18/2019   TRIG 119 02/18/2019   CHOLHDL 4.7 02/18/2019    He has not been  working on diet and exercise for prediabetes.  BMI 30+ and prediabetes (6.4% in 2011). Denies hyperglycemia, hypoglycemia , nausea, paresthesia of the feet, polydipsia, polyuria, visual disturbances and vomiting. Last A1C in the office was:  Lab Results  Component Value Date   HGBA1C 4.5 02/18/2019    Patient does not have history of CKD.  Their last GFR was:  Lab  Results  Component Value Date   GFRNONAA 74 02/18/2019   Lab Results  Component Value Date   GFRAA 85 02/18/2019    Patient has hypothyroidism and on replacement since 2002  Currently taking levothyroxine 263mg daily.  His medication was not changed last visit. Lab Results  Component Value Date   TSH 2.97 02/18/2019         Patient is on Vitamin D supplement with deficiency noted (36 / 2008). Lab Results  Component Value Date   VD25OH 90 02/18/2019       Current Medications:  Current Outpatient Medications on File Prior to Visit  Medication Sig  . ACCU-CHEK FASTCLIX LANCETS MISC CHECK  BLOOD  SUGAR ONE TIME DAILY  . ACCU-CHEK SMARTVIEW test strip CHECK  BLOOD  SUGAR ONE TIME DAILY  . allopurinol (ZYLOPRIM) 300 MG tablet Take 1 tablet Daily to Prevent Gout  . Ascorbic Acid (VITAMIN C PO) Take 2,000 mg by mouth daily.   .Marland Kitchenatenolol (TENORMIN) 100 MG tablet TAKE 1 TABLET EVERY DAY  . Blood Glucose Calibration (ACCU-CHEK SMARTVIEW CONTROL) LIQD Check blood sugar 1 time daily-DX-R73.03  . Blood Glucose Monitoring Suppl (ACCU-CHEK NANO SMARTVIEW) w/Device KIT Check blood sugar 1 time daily-DX-R73.03  . Cholecalciferol (VITAMIN D PO) Take 10,000 Int'l Units by mouth daily.  . finasteride (PROSCAR) 5 MG tablet Take 1 tablet (5 mg total) by mouth daily.  .Marland Kitchenlevothyroxine (SYNTHROID, LEVOTHROID) 200 MCG tablet Take 200 mcg by mouth daily before breakfast.  . MAGNESIUM PO Take 250 mg by mouth 3 (three) times daily.   . Omega-3 Fatty Acids (FISH OIL PO) Take 1,000 mg by mouth daily.   . predniSONE (DELTASONE) 10 MG tablet Take 1 tablet every 2 to 3 days (Must last longer than 30 days)  . losartan (COZAAR) 50 MG tablet Take 1 tablet daily for BP  . rosuvastatin (CRESTOR) 40 MG tablet Take 1/2 to 1 tablet daily or as directed for Cholesterol   No current facility-administered medications on file prior to visit.    Allergies:  Allergies  Allergen Reactions  . Lotensin [Benazepril  Hcl]     unknown  . Penicillins Rash      Medical History:  Past Medical History:  Diagnosis Date  . BPH with elevated PSA   . Cataracts, bilateral   . Diverticulosis   . Gout   . Hx of adenomatous colonic polyps 06/18/2014  . Hyperlipidemia   . Hypertension   . Prediabetes   . Thyroid disease   . Tubular adenoma 02/17/2003  . Umbilical hernia 140/34/7425    Family history- Reviewed and unchanged Family History  Problem Relation Age of Onset  . Cancer Mother   . COPD Father   . Colon cancer Neg Hx   . Esophageal cancer Neg Hx   . Rectal cancer Neg Hx   . Stomach cancer Neg Hx      Social history- Reviewed and unchanged Social History   Tobacco Use  . Smoking status: Former Smoker    Quit date: 05/30/1959    Years since quitting: 60.0  . Smokeless tobacco: Never  Used  Substance Use Topics  . Alcohol use: No  . Drug use: No     Patient Care Team: Unk Pinto, MD as PCP - General (Internal Medicine) [place], [facility] Eduardo Osier., MD as Attending Physician (Urology) Gatha Mayer, MD as Consulting Physician (Gastroenterology) Clent Jacks, MD as Consulting Physician (Ophthalmology)    Screening Tests: Immunization History  Administered Date(s) Administered  . Pneumococcal Polysaccharide-23 08/19/2008  . Td 09/07/2009     Vaccinations: TD or Tdap:2011  Influenza: DUE, declines Pneumococcal: 2010 Prevnar13: DUE, declines Shingles: Zostavax/Shingrix: DUE, declines  Preventative Care: Last colonoscopy: 2019  Review of Systems:  Review of Systems  Constitutional: Negative for chills, diaphoresis, fever, malaise/fatigue and weight loss.  HENT: Negative for congestion, ear discharge, ear pain, hearing loss, nosebleeds, sinus pain, sore throat and tinnitus.   Eyes: Negative for blurred vision, double vision, photophobia, pain, discharge and redness.  Respiratory: Negative for cough, hemoptysis, sputum production, shortness of breath, wheezing and  stridor.   Cardiovascular: Negative for chest pain, palpitations, orthopnea, claudication, leg swelling and PND.  Gastrointestinal: Negative for abdominal pain, blood in stool, constipation, diarrhea, heartburn, melena, nausea and vomiting.  Genitourinary: Negative for dysuria, flank pain, frequency, hematuria and urgency.  Musculoskeletal: Negative for back pain, falls, joint pain, myalgias and neck pain.  Skin: Negative for itching and rash.       redness to left outer ankle.  Neurological: Negative for dizziness, tingling, tremors, sensory change, speech change, focal weakness, seizures, loss of consciousness, weakness and headaches.  Endo/Heme/Allergies: Negative for environmental allergies and polydipsia. Does not bruise/bleed easily.  Psychiatric/Behavioral: Negative for depression, hallucinations, memory loss, substance abuse and suicidal ideas. The patient is not nervous/anxious and does not have insomnia.       Physical Exam: BP 140/80   Pulse 86   Temp 97.7 F (36.5 C)   Ht 5' 7" (1.702 m)   Wt 191 lb 9.6 oz (86.9 kg)   SpO2 95%   BMI 30.01 kg/m  Wt Readings from Last 3 Encounters:  05/27/19 191 lb 9.6 oz (86.9 kg)  02/18/19 190 lb 9.6 oz (86.5 kg)  11/12/18 197 lb (89.4 kg)   General Appearance: Well nourished, in no apparent distress. Eyes: PERRLA, EOMs, conjunctiva no swelling or erythema Sinuses: No Frontal/maxillary tenderness ENT/Mouth: Ext aud canals clear, TMs without erythema, bulging. No erythema, swelling, or exudate on post pharynx.  Tonsils not swollen or erythematous. Hearing normal.  Neck: Supple, thyroid normal.  Respiratory: Respiratory effort normal, BS equal bilaterally without rales, rhonchi, wheezing or stridor.  Cardio: RRR with no MRGs. Brisk peripheral pulses without edema.  Abdomen: Soft, + BS.  Non tender, no guarding, rebound, hernias, masses. Lymphatics: Non tender without lymphadenopathy.  Musculoskeletal: Full ROM, 5/5 strength, Normal  gait Skin: Warm, dry without rashes, lesions.  Redness noted to left lateral malleolus.  No exudate or open areas. Neuro: Cranial nerves intact. No cerebellar symptoms.  Psych: Awake and oriented X 3, normal affect, Insight and Judgment appropriate.    Garnet Sierras, NP [facility] 12:30 PM

## 2019-05-27 ENCOUNTER — Encounter: Payer: Self-pay | Admitting: Adult Health Nurse Practitioner

## 2019-05-27 ENCOUNTER — Other Ambulatory Visit: Payer: Self-pay

## 2019-05-27 ENCOUNTER — Ambulatory Visit (INDEPENDENT_AMBULATORY_CARE_PROVIDER_SITE_OTHER): Payer: Medicare PPO | Admitting: Adult Health Nurse Practitioner

## 2019-05-27 VITALS — BP 140/80 | HR 86 | Temp 97.7°F | Ht 67.0 in | Wt 191.6 lb

## 2019-05-27 DIAGNOSIS — E559 Vitamin D deficiency, unspecified: Secondary | ICD-10-CM | POA: Diagnosis not present

## 2019-05-27 DIAGNOSIS — I10 Essential (primary) hypertension: Secondary | ICD-10-CM

## 2019-05-27 DIAGNOSIS — E782 Mixed hyperlipidemia: Secondary | ICD-10-CM

## 2019-05-27 DIAGNOSIS — E039 Hypothyroidism, unspecified: Secondary | ICD-10-CM | POA: Diagnosis not present

## 2019-05-27 DIAGNOSIS — M25571 Pain in right ankle and joints of right foot: Secondary | ICD-10-CM

## 2019-05-27 DIAGNOSIS — N401 Enlarged prostate with lower urinary tract symptoms: Secondary | ICD-10-CM | POA: Diagnosis not present

## 2019-05-27 DIAGNOSIS — R7309 Other abnormal glucose: Secondary | ICD-10-CM | POA: Diagnosis not present

## 2019-05-27 DIAGNOSIS — M1000 Idiopathic gout, unspecified site: Secondary | ICD-10-CM

## 2019-05-27 DIAGNOSIS — Z79899 Other long term (current) drug therapy: Secondary | ICD-10-CM

## 2019-05-27 DIAGNOSIS — N138 Other obstructive and reflux uropathy: Secondary | ICD-10-CM

## 2019-05-27 DIAGNOSIS — Z6830 Body mass index (BMI) 30.0-30.9, adult: Secondary | ICD-10-CM

## 2019-05-27 NOTE — Patient Instructions (Signed)
We will contact you in 1-3 days wit our lab results  Scrub the outside of your right ankle with washcloth when you shower.  Put vaseline on this area four times a day.  Keep this area moisturized to see if this will help to heal. If the area of redness increases, pain with walking or any changes please let us know.   Vit D  & Vit C 1,000 mg   are recommended to help protect  against the Covid-19 and other Corona viruses.    Also it's recommended  to take  Zinc 50 mg  to help  protect against the Covid-19   and best place to get  is also on [email or web address]  and don't pay more than 6-8 cents /pill !  ================================ Coronavirus (COVID-19) Are you at risk?  Are you at risk for the Coronavirus (COVID-19)?  To be considered HIGH RISK for Coronavirus (COVID-19), you have to meet the following criteria:  . Traveled to [place], [place], [place], [place] or [place]; or in the [place] to [place], [place], [place]  . or [place]; and have fever, cough, and shortness of breath within the last 2 weeks of travel OR . Been in close contact with a person diagnosed with COVID-19 within the last 2 weeks and have  . fever, cough,and shortness of breath .  . IF YOU DO NOT MEET THESE CRITERIA, YOU ARE CONSIDERED LOW RISK FOR COVID-19.  What to do if you are HIGH RISK for COVID-19?  Marland Kitchen If you are having a medical emergency, call 911. . Seek medical care right away. Before you go to a doctor's office, urgent care or emergency department, .  call ahead and tell them about your recent travel, contact with someone diagnosed with COVID-19  .  and your symptoms.  . You should receive instructions from your physician's office regarding next steps of care.  . When you arrive at healthcare provider, tell the healthcare staff immediately you have returned from  . visiting [place], [place], [place], [place] or [place]; or traveled in the [place] to [place], [place],  . [place] or [place] in the last two weeks or you have been in close contact with a person diagnosed with  . COVID-19 in the last 2 weeks.   . Tell the health care staff about your symptoms: fever, cough and shortness of breath. . After you have been seen by a medical provider, you will be either: o Tested for (COVID-19) and discharged home on quarantine except to seek medical care if  o symptoms worsen, and asked to  - Stay home and avoid contact with others until you get your results (4-5 days)  - Avoid travel on public transportation if possible (such as bus, train, or airplane) or o Sent to the Emergency Department by EMS for evaluation, COVID-19 testing  and  o possible admission depending on your condition and test results.  What to do if you are LOW RISK for COVID-19?  Reduce your risk of any infection by using the same precautions used for avoiding the common cold or flu:  Marland Kitchen Wash your hands often with soap and warm water for at least 20 seconds.  If soap and water are not readily available,  . use an alcohol-based hand sanitizer with at least 60% alcohol.  . If coughing or sneezing, cover your mouth and nose by coughing or sneezing into the elbow areas of your shirt or coat, .  into a tissue or into your sleeve (not your hands). . Avoid shaking hands with others and consider head nods or verbal greetings only. . Avoid touching your eyes, nose, or mouth with unwashed hands.  . Avoid close contact with people who are sick. . Avoid places or events with large numbers of people in one location, like concerts or sporting events. . Carefully consider travel plans you have or are making. . If you are planning any travel outside or inside the [place], visit the CDC's Travelers' Health webpage for the latest health notices. . If you have some symptoms but not all symptoms, continue to monitor at home and seek medical attention  . if your symptoms worsen. . If you are having a medical emergency,  call 911. >>>>>>>>>>>>>>>>>>>>>>>>>>>>>>>>>>>>>>>>>>>>>>>>>>>>>>> We Do NOT Approve of  [facility] [place] Soliciting Our Patients  To Do Home Visits  & We Do NOT Approve of [employer] > > > > > > > > > > > > > > > > > > > > > > > > > > > > > > > > > > >  > > > >   Preventive Care for Adults  A healthy lifestyle and preventive care can promote health and wellness. Preventive health guidelines for men include the following key practices:  A routine yearly physical is a good way to check with your health care provider about your health and preventative screening. It is a chance to share any concerns and updates on your health and to receive a thorough exam.  Visit your dentist for a routine exam and preventative care every 6 months. Brush your teeth twice a day and floss once a day. Good oral hygiene prevents tooth decay and gum disease.  The frequency of eye exams is based on your age, health, family medical history, use of contact lenses, and other factors. Follow your health care provider's recommendations for frequency of eye exams.  Eat a healthy diet. Foods such as vegetables, fruits, whole grains, low-fat dairy products, and lean protein foods contain the nutrients you need without too many calories. Decrease your intake of foods high in solid fats, added sugars, and salt. Eat the right amount of calories for you. Get information about a proper diet from your health care provider, if necessary.  Regular physical exercise is one of the most important things you can do for your health. Most adults should get at least 150 minutes of moderate-intensity exercise (any activity that increases your heart rate and causes you to sweat) each week. In addition, most adults need muscle-strengthening exercises on 2 or more days a week.  Maintain a healthy weight. The body mass index (BMI) is a screening tool to identify possible weight problems. It provides an estimate of body  fat based on height and weight. Your health care provider can find your BMI and can help you achieve or maintain a healthy weight. For adults 20 years and older:  A BMI below 18.5 is considered underweight.  A BMI of 18.5 to 24.9 is normal.  A BMI of 25 to 29.9 is considered overweight.  A BMI of 30 and above is considered obese.  Maintain normal blood lipids and cholesterol levels by exercising and minimizing your intake of saturated fat. Eat a balanced diet with plenty of fruit and vegetables. Blood tests for lipids and cholesterol should begin at age 11 and be repeated every 5 years. If your lipid or cholesterol levels are high, you are  over 22, or you are at high risk for heart disease, you may need your cholesterol levels checked more frequently. Ongoing high lipid and cholesterol levels should be treated with medicines if diet and exercise are not working.  If you smoke, find out from your health care provider how to quit. If you do not use tobacco, do not start.  Lung cancer screening is recommended for adults aged 58-80 years who are at high risk for developing lung cancer because of a history of smoking. A yearly low-dose CT scan of the lungs is recommended for people who have at least a 30-pack-year history of smoking and are a current smoker or have quit within the past 15 years. A pack year of smoking is smoking an average of 1 pack of cigarettes a day for 1 year (for example: 1 pack a day for 30 years or 2 packs a day for 15 years). Yearly screening should continue until the smoker has stopped smoking for at least 15 years. Yearly screening should be stopped for people who develop a health problem that would prevent them from having lung cancer treatment.  If you choose to drink alcohol, do not have more than 2 drinks per day. One drink is considered to be 12 ounces (355 mL) of beer, 5 ounces (148 mL) of wine, or 1.5 ounces (44 mL) of liquor.  Avoid use of street drugs. Do not share  needles with anyone. Ask for help if you need support or instructions about stopping the use of drugs.  High blood pressure causes heart disease and increases the risk of stroke. Your blood pressure should be checked at least every 1-2 years. Ongoing high blood pressure should be treated with medicines, if weight loss and exercise are not effective.  If you are 35-54 years old, ask your health care provider if you should take aspirin to prevent heart disease.  Diabetes screening involves taking a blood sample to check your fasting blood sugar level. Testing should be considered at a younger age or be carried out more frequently if you are overweight and have at least 1 risk factor for diabetes.  Colorectal cancer can be detected and often prevented. Most routine colorectal cancer screening begins at the age of 44 and continues through age 52. However, your health care provider may recommend screening at an earlier age if you have risk factors for colon cancer. On a yearly basis, your health care provider may provide home test kits to check for hidden blood in the stool. Use of a small camera at the end of a tube to directly examine the colon (sigmoidoscopy or colonoscopy) can detect the earliest forms of colorectal cancer. Talk to your health care provider about this at age 55, when routine screening begins. Direct exam of the colon should be repeated every 5-10 years through age 7, unless early forms of precancerous polyps or small growths are found.  Hepatitis C blood testing is recommended for all people born from [birth year] through 1963 and any individual with known risks for hepatitis C.  Screening for abdominal aortic aneurysm (AAA)  by ultrasound is recommended for people who have history of high blood pressure or who are current or former smokers.  Healthy men should  receive prostate-specific antigen (PSA) blood tests as part of routine cancer screening. Talk with your health care provider about  prostate cancer screening.  Testicular cancer screening is  recommended for adult males. Screening includes self-exam, a health care provider exam, and other screening  tests. Consult with your health care provider about any symptoms you have or any concerns you have about testicular cancer.  Use sunscreen. Apply sunscreen liberally and repeatedly throughout the day. You should seek shade when your shadow is shorter than you. Protect yourself by wearing long sleeves, pants, a wide-brimmed hat, and sunglasses year round, whenever you are outdoors.  Once a month, do a whole-body skin exam, using a mirror to look at the skin on your back. Tell your health care provider about new moles, moles that have irregular borders, moles that are larger than a pencil eraser, or moles that have changed in shape or color.  Stay current with required vaccines (immunizations).  Influenza vaccine. All adults should be immunized every year.  Tetanus, diphtheria, and acellular pertussis (Td, Tdap) vaccine. An adult who has not previously received Tdap or who does not know his vaccine status should receive 1 dose of Tdap. This initial dose should be followed by tetanus and diphtheria toxoids (Td) booster doses every 10 years. Adults with an unknown or incomplete history of completing a 3-dose immunization series with Td-containing vaccines should begin or complete a primary immunization series including a Tdap dose. Adults should receive a Td booster every 10 years.  Zoster vaccine. One dose is recommended for adults aged 32 years or older unless certain conditions are present.    PREVNAR - Pneumococcal 13-valent conjugate (PCV13) vaccine. When indicated, a person who is uncertain of his immunization history and has no record of immunization should receive the PCV13 vaccine. An adult aged 14 years or older who has certain medical conditions and has not been previously immunized should receive 1 dose of PCV13 vaccine. This  PCV13 should be followed with a dose of pneumococcal polysaccharide (PPSV23) vaccine. The PPSV23 vaccine dose should be obtained 1 or more year(s)after the dose of PCV13 vaccine. An adult aged 48 years or older who has certain medical conditions and previously received 1 or more doses of PPSV23 vaccine should receive 1 dose of PCV13. The PCV13 vaccine dose should be obtained 1 or more years after the last PPSV23 vaccine dose.    PNEUMOVAX - Pneumococcal polysaccharide (PPSV23) vaccine. When PCV13 is also indicated, PCV13 should be obtained first. All adults aged 50 years and older should be immunized. An adult younger than age 37 years who has certain medical conditions should be immunized. Any person who resides in a nursing home or long-term care facility should be immunized. An adult smoker should be immunized. People with an immunocompromised condition and certain other conditions should receive both PCV13 and PPSV23 vaccines. People with human immunodeficiency virus (HIV) infection should be immunized as soon as possible after diagnosis. Immunization during chemotherapy or radiation therapy should be avoided. Routine use of PPSV23 vaccine is not recommended for American Indians, [place]atives, or people younger than 65 years unless there are medical conditions that require PPSV23 vaccine. When indicated, people who have unknown immunization and have no record of immunization should receive PPSV23 vaccine. One-time revaccination 5 years after the first dose of PPSV23 is recommended for people aged 19-64 years who have chronic kidney failure, nephrotic syndrome, asplenia, or immunocompromised conditions. People who received 1-2 doses of PPSV23 before age 75 years should receive another dose of PPSV23 vaccine at age 4 years or later if at least 5 years have passed since the previous dose. Doses of PPSV23 are not needed for people immunized with PPSV23 at or after age 17 years.    Hepatitis A vaccine.  Adults who wish to be protected from this disease, have certain high-risk conditions, work with hepatitis A-infected animals, work in hepatitis A research labs, or travel to or work in countries with a high rate of hepatitis A should be immunized. Adults who were previously unvaccinated and who anticipate close contact with an international adoptee during the first 60 days after arrival in the [place] from a country with a high rate of hepatitis A should be immunized.    Hepatitis B vaccine. Adults should be immunized if they wish to be protected from this disease, have certain high-risk conditions, may be exposed to blood or other infectious body fluids, are household contacts or sex partners of hepatitis B positive people, are clients or workers in certain care facilities, or travel to or work in countries with a high rate of hepatitis B.   Preventive Service / Frequency   Ages 65 and over  Blood pressure check.  Lipid and cholesterol check.  Lung cancer screening. / Every year if you are aged 61-80 years and have a 30-pack-year history of smoking and currently smoke or have quit within the past 15 years. Yearly screening is stopped once you have quit smoking for at least 15 years or develop a health problem that would prevent you from having lung cancer treatment.  Fecal occult blood test (FOBT) of stool. You may not have to do this test if you get a colonoscopy every 10 years.  Flexible sigmoidoscopy** or colonoscopy.** / Every 5 years for a flexible sigmoidoscopy or every 10 years for a colonoscopy beginning at age 91 and continuing until age 86.  Hepatitis C blood test.** / For all people born from [birth year] through 1963 and any individual with known risks for hepatitis C.  Abdominal aortic aneurysm (AAA) screening./ Screening current or former smokers or have Hypertension.  Skin self-exam. / Monthly.  Influenza vaccine. / Every year.  Tetanus, diphtheria, and acellular pertussis  (Tdap/Td) vaccine.** / 1 dose of Td every 10 years.   Zoster vaccine.** / 1 dose for adults aged 69 years or older.         Pneumococcal 13-valent conjugate (PCV13) vaccine.    Pneumococcal polysaccharide (PPSV23) vaccine.     Hepatitis A vaccine.** / Consult your health care provider.  Hepatitis B vaccine.** / Consult your health care provider. Screening for abdominal aortic aneurysm (AAA)  by ultrasound is recommended for people who have history of high blood pressure or who are current or former smokers. ++++++++++ Recommend Adult Low Dose Aspirin or  coated  Aspirin 81 mg daily  To reduce risk of Colon Cancer 40 %,  Skin Cancer 26 % ,  Malignant Melanoma 46%  and  Pancreatic cancer 60% ++++++++++++++++++++++ Vitamin D goal  is between 70-100.  Please make sure that you are taking your Vitamin D as directed.  It is very important as a natural anti-inflammatory  helping hair, skin, and nails, as well as reducing stroke and heart attack risk.  It helps your bones and helps with mood. It also decreases numerous cancer risks so please take it as directed.  Low Vit D is associated with a 200-300% higher risk for CANCER  and 200-300% higher risk for HEART   ATTACK  &  STROKE.   .....................................Marland Kitchen It is also associated with higher death rate at younger ages,  autoimmune diseases like Rheumatoid arthritis, Lupus, Multiple Sclerosis.    Also many other serious conditions, like depression, Alzheimer's Dementia, infertility, muscle aches, fatigue, fibromyalgia -  just to name a few. ++++++++++++++++++++++ Recommend the book "The END of DIETING" by Dr Excell Seltzer  & the book "The END of DIABETES " by Dr Excell Seltzer At [email or web address] - get book & Audio CD's    Being diabetic has a  300% increased risk for heart attack, stroke, cancer, and alzheimer- type vascular dementia. It is very important that you work harder with diet by avoiding all foods that are white. Avoid  white rice (brown & wild rice is OK), white potatoes (sweetpotatoes in moderation is OK), White bread or wheat bread or anything made out of white flour like bagels, donuts, rolls, buns, biscuits, cakes, pastries, cookies, pizza crust, and pasta (made from white flour & egg whites) - vegetarian pasta or spinach or wheat pasta is OK. Multigrain breads like Arnold's or Pepperidge [employer], or multigrain sandwich thins or flatbreads.  Diet, exercise and weight loss can reverse and cure diabetes in the early stages.  Diet, exercise and weight loss is very important in the control and prevention of complications of diabetes which affects every system in your body, ie. Brain - dementia/stroke, eyes - glaucoma/blindness, heart - heart attack/heart failure, kidneys - dialysis, stomach - gastric paralysis, intestines - malabsorption, nerves - severe painful neuritis, circulation - gangrene & loss of a leg(s), and finally cancer and Alzheimers.    I recommend avoid fried & greasy foods,  sweets/candy, white rice (brown or wild rice or Quinoa is OK), white potatoes (sweet potatoes are OK) - anything made from white flour - bagels, doughnuts, rolls, buns, biscuits,white and wheat breads, pizza crust and traditional pasta made of white flour & egg white(vegetarian pasta or spinach or wheat pasta is OK).  Multi-grain bread is OK - like multi-grain flat bread or sandwich thins. Avoid alcohol in excess. Exercise is also important.    Eat all the vegetables you want - avoid meat, especially red meat and dairy - especially cheese.  Cheese is the most concentrated form of trans-fats which is the worst thing to clog up our arteries. Veggie cheese is OK which can be found in the fresh produce section at [employer] or [employer] or [employer]  ++++++++++++++++++++++ DASH Eating Plan  DASH stands for "Dietary Approaches to Stop Hypertension."   The DASH eating plan is a healthy eating plan that has been shown to reduce high  blood pressure (hypertension). Additional health benefits may include reducing the risk of type 2 diabetes mellitus, heart disease, and stroke. The DASH eating plan may also help with weight loss. WHAT DO I NEED TO KNOW ABOUT THE DASH EATING PLAN? For the DASH eating plan, you will follow these general guidelines:  Choose foods with a percent daily value for sodium of less than 5% (as listed on the food label).  Use salt-free seasonings or herbs instead of table salt or sea salt.  Check with your health care provider or pharmacist before using salt substitutes.  Eat lower-sodium products, often labeled as "lower sodium" or "no salt added."  Eat fresh foods.  Eat more vegetables, fruits, and low-fat dairy products.  Choose whole grains. Look for the word "whole" as the first word in the ingredient list.  Choose fish   Limit sweets, desserts, sugars, and sugary drinks.  Choose heart-healthy fats.  Eat veggie cheese   Eat more home-cooked food and less restaurant, buffet, and fast food.  Limit fried foods.  Cook foods using methods other than frying.  Limit canned vegetables. If you do use them, rinse  them well to decrease the sodium.  When eating at a restaurant, ask that your food be prepared with less salt, or no salt if possible.                      WHAT FOODS CAN I EAT? Read Dr Fara Olden Fuhrman's books on The End of Dieting & The End of Diabetes  Grains Whole grain or whole wheat bread. Brown rice. Whole grain or whole wheat pasta. Quinoa, bulgur, and whole grain cereals. Low-sodium cereals. Corn or whole wheat flour tortillas. Whole grain cornbread. Whole grain crackers. Low-sodium crackers.  Vegetables Fresh or frozen vegetables (raw, steamed, roasted, or grilled). Low-sodium or reduced-sodium tomato and vegetable juices. Low-sodium or reduced-sodium tomato sauce and paste. Low-sodium or reduced-sodium canned vegetables.   Fruits All fresh, canned (in natural juice), or  frozen fruits.  Protein Products  All fish and seafood.  Dried beans, peas, or lentils. Unsalted nuts and seeds. Unsalted canned beans.  Dairy Low-fat dairy products, such as skim or 1% milk, 2% or reduced-fat cheeses, low-fat ricotta or cottage cheese, or plain low-fat yogurt. Low-sodium or reduced-sodium cheeses.  Fats and Oils Tub margarines without trans fats. Light or reduced-fat mayonnaise and salad dressings (reduced sodium). Avocado. Safflower, olive, or canola oils. Natural peanut or almond butter.  Other Unsalted popcorn and pretzels. The items listed above may not be a complete list of recommended foods or beverages. Contact your dietitian for more options.  ++++++++++++++++++++  WHAT FOODS ARE NOT RECOMMENDED? Grains/ White flour or wheat flour White bread. White pasta. White rice. Refined cornbread. Bagels and croissants. Crackers that contain trans fat.  Vegetables  Creamed or fried vegetables. Vegetables in a . Regular canned vegetables. Regular canned tomato sauce and paste. Regular tomato and vegetable juices.  Fruits Dried fruits. Canned fruit in light or heavy syrup. Fruit juice.  Meat and Other Protein Products Meat in general - RED meat & White meat.  Fatty cuts of meat. Ribs, chicken wings, all processed meats as bacon, sausage, bologna, salami, fatback, hot dogs, bratwurst and packaged luncheon meats.  Dairy Whole or 2% milk, cream, half-and-half, and cream cheese. Whole-fat or sweetened yogurt. Full-fat cheeses or blue cheese. Non-dairy creamers and whipped toppings. Processed cheese, cheese spreads, or cheese curds.  Condiments Onion and garlic salt, seasoned salt, table salt, and sea salt. Canned and packaged gravies. Worcestershire sauce. Tartar sauce. Barbecue sauce. Teriyaki sauce. Soy sauce, including reduced sodium. Steak sauce. Fish sauce. Oyster sauce. Cocktail sauce. Horseradish. Ketchup and mustard. Meat flavorings and tenderizers. Bouillon cubes.  Hot sauce. Tabasco sauce. Marinades. Taco seasonings. Relishes.  Fats and Oils Butter, stick margarine, lard, shortening and bacon fat. Coconut, palm kernel, or palm oils. Regular salad dressings.  Pickles and olives. Salted popcorn and pretzels.  The items listed above may not be a complete list of foods and beverages to avoid.

## 2019-05-28 LAB — HEMOGLOBIN A1C
Hgb A1c MFr Bld: 4.5 % of total Hgb (ref <5.7)
Mean Plasma Glucose: 82 (calc)
eAG (mmol/L): 4.6 (calc)

## 2019-05-28 LAB — CBC WITH DIFFERENTIAL/PLATELET
Absolute Monocytes: 390 cells/uL (ref 200–950)
Basophils Absolute: 49 cells/uL (ref 0–200)
Basophils Relative: 0.8 %
Eosinophils Absolute: 232 cells/uL (ref 15–500)
Eosinophils Relative: 3.8 %
HCT: 40 % (ref 38.5–50.0)
Hemoglobin: 13.8 g/dL (ref 13.2–17.1)
Lymphs Abs: 1531 cells/uL (ref 850–3900)
MCH: 31.7 pg (ref 27.0–33.0)
MCHC: 34.5 g/dL (ref 32.0–36.0)
MCV: 92 fL (ref 80.0–100.0)
MPV: 10.6 fL (ref 7.5–12.5)
Monocytes Relative: 6.4 %
Neutro Abs: 3898 cells/uL (ref 1500–7800)
Neutrophils Relative %: 63.9 %
Platelets: 175 10*3/uL (ref 140–400)
RBC: 4.35 10*6/uL (ref 4.20–5.80)
RDW: 13.1 % (ref 11.0–15.0)
Total Lymphocyte: 25.1 %
WBC: 6.1 10*3/uL (ref 3.8–10.8)

## 2019-05-28 LAB — COMPLETE METABOLIC PANEL WITH GFR
AG Ratio: 1.7 (calc) (ref 1.0–2.5)
ALT: 25 U/L (ref 9–46)
AST: 26 U/L (ref 10–35)
Albumin: 4.1 g/dL (ref 3.6–5.1)
Alkaline phosphatase (APISO): 75 U/L (ref 35–144)
BUN: 11 mg/dL (ref 7–25)
CO2: 29 mmol/L (ref 20–32)
Calcium: 9.5 mg/dL (ref 8.6–10.3)
Chloride: 106 mmol/L (ref 98–110)
Creat: 0.95 mg/dL (ref 0.70–1.18)
GFR, Est African American: 90 mL/min/{1.73_m2} (ref >=60)
GFR, Est Non African American: 77 mL/min/{1.73_m2} (ref >=60)
Globulin: 2.4 g/dL (calc) (ref 1.9–3.7)
Glucose, Bld: 93 mg/dL (ref 65–99)
Potassium: 4 mmol/L (ref 3.5–5.3)
Sodium: 144 mmol/L (ref 135–146)
Total Bilirubin: 0.7 mg/dL (ref 0.2–1.2)
Total Protein: 6.5 g/dL (ref 6.1–8.1)

## 2019-05-28 LAB — MAGNESIUM: Magnesium: 1.9 mg/dL (ref 1.5–2.5)

## 2019-05-28 LAB — INSULIN, RANDOM: Insulin: 8 u[IU]/mL

## 2019-05-28 LAB — LIPID PANEL
Cholesterol: 221 mg/dL — ABNORMAL HIGH (ref <200)
HDL: 55 mg/dL (ref >=40)
LDL Cholesterol (Calc): 147 mg/dL (calc) — ABNORMAL HIGH
Non-HDL Cholesterol (Calc): 166 mg/dL (calc) — ABNORMAL HIGH (ref <130)
Total CHOL/HDL Ratio: 4 (calc) (ref <5.0)
Triglycerides: 89 mg/dL (ref <150)

## 2019-05-28 LAB — TSH: TSH: 2.46 mIU/L (ref 0.40–4.50)

## 2019-05-28 LAB — VITAMIN D 25 HYDROXY (VIT D DEFICIENCY, FRACTURES): Vit D, 25-Hydroxy: 104 ng/mL — ABNORMAL HIGH (ref 30–100)

## 2019-05-29 ENCOUNTER — Other Ambulatory Visit: Payer: Self-pay | Admitting: Adult Health Nurse Practitioner

## 2019-05-29 DIAGNOSIS — E782 Mixed hyperlipidemia: Secondary | ICD-10-CM

## 2019-05-29 MED ORDER — EZETIMIBE 10 MG PO TABS: 10.0000 mg | ORAL_TABLET | Freq: Every day | ORAL | 3 refills | Status: DC

## 2019-06-25 ENCOUNTER — Ambulatory Visit: Payer: Medicare PPO

## 2019-07-04 ENCOUNTER — Ambulatory Visit: Payer: Medicare PPO | Attending: Internal Medicine

## 2019-07-04 DIAGNOSIS — Z23 Encounter for immunization: Secondary | ICD-10-CM | POA: Insufficient documentation

## 2019-07-04 NOTE — Progress Notes (Signed)
   Covid-19 Vaccination Clinic  Name:  Andrew Cochran    MRN: [id number] DOB: 27-Jun-1940  07/04/2019  Andrew Cochran was observed post Covid-19 immunization for 15 minutes without incidence. He was provided with Vaccine Information Sheet and instruction to access the V-Safe system.   Andrew Cochran was instructed to call 911 with any severe reactions post vaccine: Marland Kitchen Difficulty breathing  . Swelling of your face and throat  . A fast heartbeat  . A bad rash all over your body  . Dizziness and weakness    Immunizations Administered    Name Date Dose VIS Date Route   Pfizer COVID-19 Vaccine 07/04/2019  8:56 AM 0.3 mL 05/09/2019 Intramuscular   Manufacturer: [place]   Lot: [id number]   [place]: [id number]

## 2019-07-06 ENCOUNTER — Ambulatory Visit: Payer: Medicare PPO

## 2019-07-29 ENCOUNTER — Ambulatory Visit: Payer: Medicare PPO | Attending: Internal Medicine

## 2019-07-29 DIAGNOSIS — Z23 Encounter for immunization: Secondary | ICD-10-CM | POA: Insufficient documentation

## 2019-07-29 NOTE — Progress Notes (Signed)
   Covid-19 Vaccination Clinic  Name:  Andrew Cochran    MRN: [id number] DOB: 06/13/1940  07/29/2019  [name] was observed post Covid-19 immunization for 15 minutes without incident. He was provided with Vaccine Information Sheet and instruction to access the V-Safe system.   [name] was instructed to call 911 with any severe reactions post vaccine: Marland Kitchen Difficulty breathing  . Swelling of face and throat  . A fast heartbeat  . A bad rash all over body  . Dizziness and weakness   Immunizations Administered    Name Date Dose VIS Date Route   Pfizer COVID-19 Vaccine 07/29/2019  9:25 AM 0.3 mL 05/09/2019 Intramuscular   Manufacturer: [place]   Lot: [id number]   [place]: [id number]

## 2019-08-04 ENCOUNTER — Other Ambulatory Visit: Payer: Self-pay | Admitting: Adult Health

## 2019-08-14 ENCOUNTER — Other Ambulatory Visit: Payer: Self-pay | Admitting: Internal Medicine

## 2019-08-14 MED ORDER — PREDNISONE 10 MG PO TABS: ORAL_TABLET | ORAL | 0 refills | Status: DC

## 2019-08-20 ENCOUNTER — Other Ambulatory Visit: Payer: Self-pay | Admitting: Internal Medicine

## 2019-08-20 DIAGNOSIS — R972 Elevated prostate specific antigen [PSA]: Secondary | ICD-10-CM

## 2019-08-20 DIAGNOSIS — E782 Mixed hyperlipidemia: Secondary | ICD-10-CM

## 2019-08-20 DIAGNOSIS — N400 Enlarged prostate without lower urinary tract symptoms: Secondary | ICD-10-CM

## 2019-08-20 DIAGNOSIS — E039 Hypothyroidism, unspecified: Secondary | ICD-10-CM

## 2019-08-20 MED ORDER — PREDNISONE 10 MG PO TABS: ORAL_TABLET | ORAL | 0 refills | Status: DC

## 2019-08-20 MED ORDER — LEVOTHYROXINE SODIUM 200 MCG PO TABS: ORAL_TABLET | ORAL | 0 refills | Status: DC

## 2019-08-20 MED ORDER — EZETIMIBE 10 MG PO TABS: ORAL_TABLET | ORAL | 0 refills | Status: DC

## 2019-08-20 MED ORDER — FINASTERIDE 5 MG PO TABS: ORAL_TABLET | ORAL | 0 refills | Status: DC

## 2019-08-26 ENCOUNTER — Ambulatory Visit: Payer: Medicare PPO | Admitting: Internal Medicine

## 2019-08-26 ENCOUNTER — Other Ambulatory Visit: Payer: Self-pay

## 2019-08-26 ENCOUNTER — Encounter: Payer: Self-pay | Admitting: Internal Medicine

## 2019-08-26 VITALS — BP 122/80 | HR 52 | Temp 96.7°F | Resp 16 | Ht 67.0 in | Wt 190.8 lb

## 2019-08-26 DIAGNOSIS — I10 Essential (primary) hypertension: Secondary | ICD-10-CM

## 2019-08-26 DIAGNOSIS — R7309 Other abnormal glucose: Secondary | ICD-10-CM | POA: Diagnosis not present

## 2019-08-26 DIAGNOSIS — E559 Vitamin D deficiency, unspecified: Secondary | ICD-10-CM

## 2019-08-26 DIAGNOSIS — M1000 Idiopathic gout, unspecified site: Secondary | ICD-10-CM

## 2019-08-26 DIAGNOSIS — Z79899 Other long term (current) drug therapy: Secondary | ICD-10-CM | POA: Diagnosis not present

## 2019-08-26 DIAGNOSIS — E782 Mixed hyperlipidemia: Secondary | ICD-10-CM | POA: Diagnosis not present

## 2019-08-26 MED ORDER — VITAMIN D3 125 MCG (5000 UT) PO CAPS: ORAL_CAPSULE | ORAL | Status: AC

## 2019-08-26 NOTE — Progress Notes (Signed)
History of Present Illness:       This very nice 79 y.o. MWM presents for 6 month follow up with HTN, HLD, T2_DM and Vitamin D Deficiency. Patient has Gout controlled on his meds.  Patient also has remote hx/o PMR ( 2011). He  continues to c/o generalized peripheral joint pains felt consistant with DJD.       Patient is treated for HTN (1997) & BP has been controlled at home. Today's BP is at goal - 122/80. Patient has remote hx/o TIA ib 2008 w/o recurrence.  Patient has had no complaints of any cardiac type chest pain, palpitations, dyspnea / orthopnea / PND, dizziness, claudication, or dependent edema.      Hyperlipidemia is not controlled with diet & Crestor / Zetia. Patient denies myalgias or other med SE's. Last Lipids were not at goal:  Lab Results  Component Value Date   CHOL 221 (H) 05/27/2019   HDL 55 05/27/2019   LDLCALC 147 (H) 05/27/2019   TRIG 89 05/27/2019   CHOLHDL 4.0 05/27/2019    Also, the patient has Moderate Obesity (BMI 30+) and consequent  PreDiabetes  (A1c 6.4% / 2011) and has had no symptoms of reactive hypoglycemia, diabetic polys, paresthesias or visual blurring.  Last A1c was Normal & at goal:  Lab Results  Component Value Date   HGBA1C 4.5 05/27/2019   Wt Readings from Last 3 Encounters:  08/26/19 190 lb 12.8 oz (86.5 kg)  05/27/19 191 lb 9.6 oz (86.9 kg)  02/18/19 190 lb 9.6 oz (86.5 kg)        Patientwas discovered  Hypothyroid in 2002 and was initiated on thyroid replacement.          Further, the patient also has history of Vitamin D Deficiency ("36" / 2008)  and supplements vitamin D without any suspected side-effects. Last vitamin D was at goal:  Lab Results  Component Value Date   VD25OH 104 (H) 05/27/2019    Current Outpatient Medications on File Prior to Visit  Medication Sig  . ACCU-CHEK FASTCLIX LANCETS MISC CHECK  BLOOD  SUGAR ONE TIME DAILY  . ACCU-CHEK SMARTVIEW test strip CHECK  BLOOD  SUGAR ONE TIME DAILY  .  allopurinol (ZYLOPRIM) 300 MG tablet Take 1 tablet Daily to Prevent Gout  . Ascorbic Acid (VITAMIN C PO) Take 2,000 mg by mouth daily.   Marland Kitchen atenolol (TENORMIN) 100 MG tablet Take 1 tablet Daily for BP  . Blood Glucose Calibration (ACCU-CHEK SMARTVIEW CONTROL) LIQD Check blood sugar 1 time daily-DX-R73.03  . Blood Glucose Monitoring Suppl (ACCU-CHEK NANO SMARTVIEW) w/Device KIT Check blood sugar 1 time daily-DX-R73.03  . ezetimibe (ZETIA) 10 MG tablet Take 1 tablet Daily for Cholesterol  . finasteride (PROSCAR) 5 MG tablet Take 1 tablet Daily for Prostate  . levothyroxine (SYNTHROID) 200 MCG tablet Take 1 tablet daily on an empty stomach with only water for 30 minutes & no Antacid meds, Calcium or Magnesium for 4 hours & avoid Biotin  . MAGNESIUM PO Take 250 mg by mouth 3 (three) times daily.   . Omega-3 Fatty Acids (FISH OIL PO) Take 1,000 mg by mouth daily.   . predniSONE (DELTASONE) 10 MG tablet Take 1 tablet every 2 to 3 days (Must last longer than 30 days)  . losartan (COZAAR) 50 MG tablet Take 1 tablet daily for BP  . rosuvastatin (CRESTOR) 40 MG tablet Take 1/2 to 1 tablet daily or as directed for Cholesterol   No current  facility-administered medications on file prior to visit.    Allergies  Allergen Reactions  . Lotensin [Benazepril Hcl]     unknown  . Penicillins Rash    PMHx:   Past Medical History:  Diagnosis Date  . BPH with elevated PSA   . Cataracts, bilateral   . Diverticulosis   . Gout   . Hx of adenomatous colonic polyps 06/18/2014  . Hyperlipidemia   . Hypertension   . Prediabetes   . Thyroid disease   . Tubular adenoma 02/17/2003  . Umbilical hernia 51/88/4166    Immunization History  Administered Date(s) Administered  . Influenza-Unspecified 01/27/2018  . PFIZER SARS-COV-2 Vaccination 07/04/2019, 07/29/2019  . Pneumococcal Polysaccharide-23 08/19/2008  . Td 09/07/2009    Past Surgical History:  Procedure Laterality Date  . CATARACT EXTRACTION Right  2009  . COLONOSCOPY W/ BIOPSIES    . SHOULDER ARTHROSCOPY Right    1979    FHx:    Reviewed / unchanged  SHx:    Reviewed / unchanged   Systems Review:  Constitutional: Denies fever, chills, wt changes, headaches, insomnia, fatigue, night sweats, change in appetite. Eyes: Denies redness, blurred vision, diplopia, discharge, itchy, watery eyes.  ENT: Denies discharge, congestion, post nasal drip, epistaxis, sore throat, earache, hearing loss, dental pain, tinnitus, vertigo, sinus pain, snoring.  CV: Denies chest pain, palpitations, irregular heartbeat, syncope, dyspnea, diaphoresis, orthopnea, PND, claudication or edema. Respiratory: denies cough, dyspnea, DOE, pleurisy, hoarseness, laryngitis, wheezing.  Gastrointestinal: Denies dysphagia, odynophagia, heartburn, reflux, water brash, abdominal pain or cramps, nausea, vomiting, bloating, diarrhea, constipation, hematemesis, melena, hematochezia  or hemorrhoids. Genitourinary: Denies dysuria, frequency, urgency, nocturia, hesitancy, discharge, hematuria or flank pain. Musculoskeletal: Denies arthralgias, myalgias, stiffness, jt. swelling, pain, limping or strain/sprain.  Skin: Denies pruritus, rash, hives, warts, acne, eczema or change in skin lesion(s). Neuro: No weakness, tremor, incoordination, spasms, paresthesia or pain. Psychiatric: Denies confusion, memory loss or sensory loss. Endo: Denies change in weight, skin or hair change.  Heme/Lymph: No excessive bleeding, bruising or enlarged lymph nodes.  Physical Exam  BP 122/80   Pulse (!) 52   Temp (!) 96.7 F (35.9 C)   Resp 16   Ht 5' 7" (1.702 m)   Wt 190 lb 12.8 oz (86.5 kg)   BMI 29.88 kg/m   Appears  Over  nourished, well groomed  and in no distress.  Eyes: PERRLA, EOMs, conjunctiva no swelling or erythema. Sinuses: No frontal/maxillary tenderness ENT/Mouth: EAC's clear, TM's nl w/o erythema, bulging. Nares clear w/o erythema, swelling, exudates. Oropharynx clear  without erythema or exudates. Oral hygiene is good. Tongue normal, non obstructing. Hearing intact.  Neck: Supple. Thyroid not palpable. Car 2+/2+ without bruits, nodes or JVD. Chest: Respirations nl with BS clear & equal w/o rales, rhonchi, wheezing or stridor.  Cor: Heart sounds normal w/ regular rate and rhythm without sig. murmurs, gallops, clicks or rubs. Peripheral pulses normal and equal  without edema.  Abdomen: Soft & bowel sounds normal. Non-tender w/o guarding, rebound, hernias, masses or organomegaly.  Lymphatics: Unremarkable.  Musculoskeletal: Full ROM all peripheral extremities, joint stability, 5/5 strength and normal gait.  Skin: Warm, dry without exposed rashes, lesions or ecchymosis apparent.  Neuro: Cranial nerves intact, reflexes equal bilaterally. Sensory-motor testing grossly intact. Tendon reflexes grossly intact.  Pysch: Alert & oriented x 3.  Insight and judgement nl & appropriate. No ideations.  Assessment and Plan:  1. Essential hypertension  - Continue medication, monitor blood pressure at home.  - Continue DASH diet.  Reminder  to go to the ER if any CP,  SOB, nausea, dizziness, severe HA, changes vision/speech.  - CBC with Differential/Platelet - COMPLETE METABOLIC PANEL WITH GFR - Magnesium - TSH  2. Hyperlipidemia, mixed  - Continue diet/meds, exercise,& lifestyle modifications.  - Continue monitor periodic cholesterol/liver & renal functions   - Lipid panel - TSH  3. Abnormal glucose  - Continue diet, exercise  - Lifestyle modifications.  - Monitor appropriate labs.  - Hemoglobin A1c - Insulin, random  4. Vitamin D deficiency  - Continue supplementation.  - VITAMIN D 25 Hydroxy  5. Idiopathic gout  - Uric acid  6. Medication management  - CBC with Differential/Platelet - COMPLETE METABOLIC PANEL WITH GFR - Magnesium - Lipid panel - TSH - Hemoglobin A1c - Insulin, random - VITAMIN D 25 Hydroxy - Uric acid       Discussed   regular exercise, BP monitoring, weight control to achieve/maintain BMI less than 25 and discussed med and SE's. Recommended labs to assess and monitor clinical status with further disposition pending results of labs.  I discussed the assessment and treatment plan with the patient. The patient was provided an opportunity to ask questions and all were answered. The patient agreed with the plan and demonstrated an understanding of the instructions.  I provided over 30 minutes of exam, counseling, chart review and  complex critical decision making.   Kirtland Bouchard, MD

## 2019-08-26 NOTE — Patient Instructions (Signed)

## 2019-08-27 LAB — MAGNESIUM: Magnesium: 2 mg/dL (ref 1.5–2.5)

## 2019-08-27 LAB — CBC WITH DIFFERENTIAL/PLATELET
Absolute Monocytes: 365 cells/uL (ref 200–950)
Basophils Absolute: 51 cells/uL (ref 0–200)
Basophils Relative: 0.7 %
Eosinophils Absolute: 212 cells/uL (ref 15–500)
Eosinophils Relative: 2.9 %
HCT: 46.5 % (ref 38.5–50.0)
Hemoglobin: 15.6 g/dL (ref 13.2–17.1)
Lymphs Abs: 1606 cells/uL (ref 850–3900)
MCH: 30.4 pg (ref 27.0–33.0)
MCHC: 33.5 g/dL (ref 32.0–36.0)
MCV: 90.6 fL (ref 80.0–100.0)
MPV: 10.3 fL (ref 7.5–12.5)
Monocytes Relative: 5 %
Neutro Abs: 5066 cells/uL (ref 1500–7800)
Neutrophils Relative %: 69.4 %
Platelets: 212 10*3/uL (ref 140–400)
RBC: 5.13 10*6/uL (ref 4.20–5.80)
RDW: 13.1 % (ref 11.0–15.0)
Total Lymphocyte: 22 %
WBC: 7.3 10*3/uL (ref 3.8–10.8)

## 2019-08-27 LAB — LIPID PANEL
Cholesterol: 202 mg/dL — ABNORMAL HIGH (ref <200)
HDL: 48 mg/dL (ref >=40)
LDL Cholesterol (Calc): 133 mg/dL (calc) — ABNORMAL HIGH
Non-HDL Cholesterol (Calc): 154 mg/dL (calc) — ABNORMAL HIGH (ref <130)
Total CHOL/HDL Ratio: 4.2 (calc) (ref <5.0)
Triglycerides: 107 mg/dL (ref <150)

## 2019-08-27 LAB — COMPLETE METABOLIC PANEL WITH GFR
AG Ratio: 1.6 (calc) (ref 1.0–2.5)
ALT: 29 U/L (ref 9–46)
AST: 23 U/L (ref 10–35)
Albumin: 4.4 g/dL (ref 3.6–5.1)
Alkaline phosphatase (APISO): 95 U/L (ref 35–144)
BUN: 14 mg/dL (ref 7–25)
CO2: 34 mmol/L — ABNORMAL HIGH (ref 20–32)
Calcium: 10.1 mg/dL (ref 8.6–10.3)
Chloride: 104 mmol/L (ref 98–110)
Creat: 0.83 mg/dL (ref 0.70–1.18)
GFR, Est African American: 98 mL/min/{1.73_m2} (ref >=60)
GFR, Est Non African American: 85 mL/min/{1.73_m2} (ref >=60)
Globulin: 2.7 g/dL (calc) (ref 1.9–3.7)
Glucose, Bld: 99 mg/dL (ref 65–99)
Potassium: 4.3 mmol/L (ref 3.5–5.3)
Sodium: 143 mmol/L (ref 135–146)
Total Bilirubin: 1 mg/dL (ref 0.2–1.2)
Total Protein: 7.1 g/dL (ref 6.1–8.1)

## 2019-08-27 LAB — URIC ACID: Uric Acid, Serum: 3.8 mg/dL — ABNORMAL LOW (ref 4.0–8.0)

## 2019-08-27 LAB — HEMOGLOBIN A1C
Hgb A1c MFr Bld: 4.8 % of total Hgb (ref <5.7)
Mean Plasma Glucose: 91 (calc)
eAG (mmol/L): 5 (calc)

## 2019-08-27 LAB — TSH: TSH: 2.65 mIU/L (ref 0.40–4.50)

## 2019-08-27 LAB — INSULIN, RANDOM: Insulin: 8.5 u[IU]/mL

## 2019-08-27 LAB — VITAMIN D 25 HYDROXY (VIT D DEFICIENCY, FRACTURES): Vit D, 25-Hydroxy: 76 ng/mL (ref 30–100)

## 2019-09-02 ENCOUNTER — Encounter: Payer: Self-pay | Admitting: *Deleted

## 2019-09-03 ENCOUNTER — Other Ambulatory Visit: Payer: Self-pay | Admitting: *Deleted

## 2019-09-03 ENCOUNTER — Telehealth: Payer: Self-pay | Admitting: *Deleted

## 2019-09-03 ENCOUNTER — Other Ambulatory Visit: Payer: Self-pay | Admitting: Internal Medicine

## 2019-09-03 MED ORDER — QUETIAPINE FUMARATE 25 MG PO TABS: ORAL_TABLET | ORAL | 0 refills | Status: DC

## 2019-09-03 NOTE — Telephone Encounter (Signed)
Spouse called and reported the patient is very agitated and doing unusual things. He is unable to remember people he should know. She states he is spending their money recklessly. She asked if Dr Melford Aase could send an RX for a medication to calm him. Dr Melford Aase sent in Seroquel 25 mg and he advised the spouse to call the sheriff's department if he is a danger to himself and others, to have him committed. Spouse is aware.

## 2019-09-10 ENCOUNTER — Other Ambulatory Visit: Payer: Self-pay | Admitting: Internal Medicine

## 2019-09-10 MED ORDER — PREDNISONE 10 MG PO TABS: ORAL_TABLET | ORAL | 0 refills | Status: DC

## 2019-09-25 ENCOUNTER — Telehealth: Payer: Self-pay | Admitting: *Deleted

## 2019-09-25 NOTE — Telephone Encounter (Signed)
I informed pt of his appt with Dr. Amalia Hailey on 10/01/2019 at 10:00am arrive 9:45am.

## 2019-09-25 NOTE — Telephone Encounter (Signed)
Pt states he needs to know the name of the doctor he will see on Wednesday.

## 2019-10-01 ENCOUNTER — Ambulatory Visit (INDEPENDENT_AMBULATORY_CARE_PROVIDER_SITE_OTHER): Payer: Medicare HMO | Admitting: Podiatry

## 2019-10-01 ENCOUNTER — Other Ambulatory Visit: Payer: Self-pay | Admitting: Podiatry

## 2019-10-01 ENCOUNTER — Ambulatory Visit (INDEPENDENT_AMBULATORY_CARE_PROVIDER_SITE_OTHER): Payer: Medicare HMO

## 2019-10-01 ENCOUNTER — Other Ambulatory Visit: Payer: Self-pay

## 2019-10-01 DIAGNOSIS — M7751 Other enthesopathy of right foot: Secondary | ICD-10-CM | POA: Diagnosis not present

## 2019-10-01 DIAGNOSIS — G5761 Lesion of plantar nerve, right lower limb: Secondary | ICD-10-CM

## 2019-10-01 DIAGNOSIS — L989 Disorder of the skin and subcutaneous tissue, unspecified: Secondary | ICD-10-CM

## 2019-10-01 DIAGNOSIS — L309 Dermatitis, unspecified: Secondary | ICD-10-CM

## 2019-10-01 MED ORDER — GENTAMICIN SULFATE 0.1 % EX CREA: 1.0000 "application " | TOPICAL_CREAM | Freq: Two times a day (BID) | CUTANEOUS | 1 refills | Status: DC

## 2019-10-01 MED ORDER — TRIAMCINOLONE ACETONIDE 0.1 % EX CREA: 1.0000 "application " | TOPICAL_CREAM | Freq: Two times a day (BID) | CUTANEOUS | 1 refills | Status: DC

## 2019-10-01 MED ORDER — MELOXICAM 15 MG PO TABS: 15.0000 mg | ORAL_TABLET | Freq: Every day | ORAL | 1 refills | Status: AC

## 2019-10-03 ENCOUNTER — Telehealth: Payer: Self-pay | Admitting: *Deleted

## 2019-10-03 NOTE — Telephone Encounter (Signed)
Pt's wife, Vaughan Basta states pt received 2 creams and they do not know which goes on the 2 different areas.

## 2019-10-07 NOTE — Progress Notes (Signed)
   Subjective: 79 y.o. male presenting to the office today as a new patient with a chief complaint of sharp, stabbing pain to the right plantar forefoot that began about one year ago. He also reports a small, tender nodule to the right lateral ankle and a painful callus lesion to the left sub-fourth MPJ. All symptoms present for the past year. Walking increases the discomfort. He has been applying Neosporin to the right ankle and callus lesion for treatment. Patient is here for further evaluation and treatment.   Past Medical History:  Diagnosis Date  . BPH with elevated PSA   . Cataracts, bilateral   . Diverticulosis   . Gout   . Hx of adenomatous colonic polyps 06/18/2014  . Hyperlipidemia   . Hypertension   . Prediabetes   . Thyroid disease   . Tubular adenoma 02/17/2003  . Umbilical hernia [id number]     Objective:  Physical Exam General: Alert and oriented x3 in no acute distress  Dermatology: Hyperkeratotic lesion(s) present on the left sub-fourth MPJ. Pain on palpation with a central nucleated core noted. Hyperkeratosis with inflammation noted to the right lateral ankle. Skin is warm, dry and supple bilateral lower extremities. Negative for open lesions or macerations.  Vascular: Palpable pedal pulses bilaterally. No edema or erythema noted. Capillary refill within normal limits.  Neurological: Epicritic and protective threshold grossly intact bilaterally.   Musculoskeletal Exam: Pain on palpation at the keratotic lesion(s) noted. Range of motion within normal limits bilateral. Muscle strength 5/5 in all groups bilateral.  Radiographic Exam:  Normal osseous mineralization. Joint spaces preserved. No fracture/dislocation/boney destruction.    Assessment: 1. Dermatitis right lateral ankle 2. 4th MPJ capsulitis right 3. Porokeratosis left sub-fourth MPJ   Plan of Care:  1. Patient evaluated. X-Rays reviewed.  2. Excisional debridement of keratoic lesion(s) using a chisel  blade was performed without incident. Salinocaine applied.  3. Dressed area with light dressing. 4. Prescriptions for triamcinolone cream and Gentamicin cream provided to patient to combine together and apply to ankle daily.  5. Injection of 0.5 mLs Celestone Soluspan injected into the 4th MPJ of the right foot.  6. Prescription for Meloxicam provided to patient. 7. Patient is to return to the clinic PRN.   Edrick Kins, DPM [facility]  Dr. Edrick Kins, [address]                                        [place], [place] [address]                Office [phone]  Fax [phone]

## 2019-10-11 ENCOUNTER — Other Ambulatory Visit: Payer: Self-pay | Admitting: Internal Medicine

## 2019-10-28 ENCOUNTER — Telehealth: Payer: Self-pay | Admitting: *Deleted

## 2019-10-28 NOTE — Telephone Encounter (Signed)
Patient's wife is requesting something for pain, he has been limping w/ pain for the past 2-3 weeks. She states that he needs some help and the Mobic has not helped.

## 2019-10-28 NOTE — Telephone Encounter (Addendum)
I spoke with pt's wife and the phone cut off. I called back and voicemail came on informed that if pt was continuing to have pain he needed an appt [phone], and to ice at least 3 times daily for 15 minutes/session protecting the skin from the ice with a light cloth and wear a thick or stiff soled shoe to decrease movement in the area.

## 2019-11-19 ENCOUNTER — Ambulatory Visit: Payer: Medicare PPO | Admitting: Physician Assistant

## 2019-11-29 NOTE — Progress Notes (Deleted)
MEDICARE ANNUAL WELLNESS VISIT AND FOLLOW UP Assessment:   Diagnoses and all orders for this visit:  Encounter for Medicare annual wellness exam  Essential hypertension  Hyperlipidemia, mixed  Aortic atherosclerosis ([place])  Abnormal glucose  Vitamin D deficiency  Hypothyroidism, unspecified type  BPH with obstruction/lower urinary tract symptoms  Hx of adenomatous colonic polyps  Morbid obesity (HCC)  Polymyalgia rheumatica (HCC)  Idiopathic gout, unspecified chronicity, unspecified site  BMI 30.0-30.9,adult  Primary osteoarthritis involving multiple joints  Medication management    Encounter for Medicare annual wellness exam 1 year  Essential hypertension - continue medications, DASH diet, exercise and monitor at home. Call if greater than 130/80.  -     CBC with Differential/Platelet -     COMPLETE METABOLIC PANEL WITH GFR -     TSH  Aortic atherosclerosis (HCC) Control blood pressure, cholesterol, glucose, increase exercise.   Morbid obesity ([place]) - follow up 3 months for progress monitoring - increase veggies, decrease carbs - long discussion about weight loss, diet, and exercise  Polymyalgia rheumatica (HCC) Continue to monitor States no fever, chills Having some joint pain Will resend in low dose prednisone, reviewed the risk of prednisone use including addison's, cushings, such as diabetes, low bone mass, fat redistribution, eye issues, etc.  Patient understands risks, will not take prednisone continuous, only 30 given to monitor use.      Future Appointments  Date Time Provider [place]  12/02/2019  2:30 PM Garnet Sierras, NP [facility] None  03/17/2020  3:00 PM Unk Pinto, MD [facility] None     Plan:   During the course of the visit the patient was educated and counseled about appropriate screening and preventive services including:    Pneumococcal vaccine   Influenza vaccine  Td vaccine  Screening  electrocardiogram  Colorectal cancer screening  Diabetes screening  Glaucoma screening  Nutrition counseling    Subjective:  Andrew Cochran is a 79 y.o. male who presents for Medicare Annual Wellness Visit and 3 month follow up for HTN, hyperlipidemia, prediabetes, and vitamin D Def.   He has history of nonfocal TIA in 2008, no sequela. States memory is unchanged, some short term memory issues. No incontinence.   His blood pressure has been controlled at home, today their BP is   He does not workout. He denies chest pain, shortness of breath, dizziness.   BMI is There is no height or weight on file to calculate BMI., he is working on diet and exercise. Marland Kitchen He states his appetite has been less than normal. BMI is normal. Denies any night sweats. AB [place] 2012 showed fatty liver. He had colonoscopy 2019. no diarrhea/constipation. No trouble swallowing. Last PSA 3.7 and last DRE normal. Thyroid has been low, changed last visit and weight is up.  Wt Readings from Last 3 Encounters:  08/26/19 190 lb 12.8 oz (86.5 kg)  05/27/19 191 lb 9.6 oz (86.9 kg)  02/18/19 190 lb 9.6 oz (86.5 kg)   He has history of PMR x 2011, has been tapered off steroids, HOWEVER he would like to have have prednisone on hand that he would take AS needed for gout or PMR. Does not take daily.   He is on cholesterol medication, he is on crestor 1/2 pill daily, he is not on zetia and denies myalgias. His cholesterol is not at goal. The cholesterol last visit was:   Lab Results  Component Value Date   CHOL 202 (H) 08/26/2019   HDL 48 08/26/2019  LDLCALC 133 (H) 08/26/2019   TRIG 107 08/26/2019   CHOLHDL 4.2 08/26/2019   Last A1C in the office was normal, did have A1C of 6.4 in 2011 but has decreased with exercise/diet:  Lab Results  Component Value Date   HGBA1C 4.8 08/26/2019   Patient is on Vitamin D supplement.   Lab Results  Component Value Date   VD25OH 2 08/26/2019     He is on thyroid medication. His  medication was changed last visit. He is 1/2 pill a day at night.  Patient denies nervousness, palpitations and weight changes.  Lab Results  Component Value Date   TSH 2.65 08/26/2019  .  He has 3 kids, 5 grand kids and 1 great grand kid.  Patient is on allopurinol for gout and does not report a recent flare. Lab Results  Component Value Date   LABURIC 3.8 (L) 08/26/2019    Names of Other Physician/Practitioners you currently use: 1. [facility] here for primary care 2. Dr. Katy Fitch, eye doctor, Jan 2015, over due 3. [facility], dentist, 2020 Patient Care Team: Unk Pinto, MD as PCP - General (Internal Medicine) [place], [facility] Eduardo Osier., MD as Attending Physician (Urology) Gatha Mayer, MD as Consulting Physician (Gastroenterology) Clent Jacks, MD as Consulting Physician (Ophthalmology)  Medication Review: Current Outpatient Medications on File Prior to Visit  Medication Sig Dispense Refill  . ACCU-CHEK FASTCLIX LANCETS MISC CHECK  BLOOD  SUGAR ONE TIME DAILY 102 each 3  . ACCU-CHEK SMARTVIEW test strip CHECK  BLOOD  SUGAR ONE TIME DAILY 100 each 3  . allopurinol (ZYLOPRIM) 300 MG tablet Take 1 tablet Daily to Prevent Gout 90 tablet 3  . Ascorbic Acid (VITAMIN C PO) Take 2,000 mg by mouth daily.     Marland Kitchen atenolol (TENORMIN) 100 MG tablet TAKE 1 TABLET EVERY DAY FOR BLOOD PRESSURE 90 tablet 0  . Blood Glucose Calibration (ACCU-CHEK SMARTVIEW CONTROL) LIQD Check blood sugar 1 time daily-DX-R73.03 1 each 3  . Blood Glucose Monitoring Suppl (ACCU-CHEK NANO SMARTVIEW) w/Device KIT Check blood sugar 1 time daily-DX-R73.03 1 kit 0  . Cholecalciferol (VITAMIN D3) 125 MCG (5000 UT) CAPS Takes 1 capsule Daily    . ezetimibe (ZETIA) 10 MG tablet Take 1 tablet Daily for Cholesterol 90 tablet 0  . finasteride (PROSCAR) 5 MG tablet Take 1 tablet Daily for Prostate 90 tablet 0  . gentamicin cream (GARAMYCIN) 0.1 % Apply 1 application  topically 2 (two) times daily. 15 g 1  . levothyroxine (SYNTHROID) 200 MCG tablet Take 1 tablet daily on an empty stomach with only water for 30 minutes & no Antacid meds, Calcium or Magnesium for 4 hours & avoid Biotin 90 tablet 0  . losartan (COZAAR) 50 MG tablet Take 1 tablet daily for BP 90 tablet 1  . MAGNESIUM PO Take 250 mg by mouth 3 (three) times daily.     . Omega-3 Fatty Acids (FISH OIL PO) Take 1,000 mg by mouth daily.     . predniSONE (DELTASONE) 10 MG tablet Take 1 tablet every 2 to 3 days (Must last longer than 30 days) 30 tablet 0  . QUEtiapine (SEROQUEL) 25 MG tablet Take 1/2 to 1 tablet 3 x /day for anxiety 90 tablet 0  . rosuvastatin (CRESTOR) 40 MG tablet Take 1/2 to 1 tablet daily or as directed for Cholesterol 90 tablet 1  . triamcinolone cream (KENALOG) 0.1 % Apply 1 application topically 2 (two) times daily. [place]  g 1   No current facility-administered medications on file prior to visit.    Current Problems (verified) Patient Active Problem List   Diagnosis Date Noted  . Aortic atherosclerosis ([place]) 04/10/2017  . Morbid obesity ([place]) 07/07/2014  . DJD  07/07/2014  . Polymyalgia rheumatica ([place]) 07/07/2014  . Hx of adenomatous colonic polyps 06/18/2014  . Other abnormal glucose 09/25/2013  . Vitamin D deficiency 09/25/2013  . Medication management 09/25/2013  . Cataracts, bilateral   . Gout   . Hypertension   . Hypothyroidism   . BPH with elevated PSA   . Hyperlipidemia      Immunization History  Administered Date(s) Administered  . Influenza-Unspecified 01/27/2018  . PFIZER SARS-COV-2 Vaccination 07/04/2019, 07/29/2019  . Pneumococcal Polysaccharide-23 08/19/2008  . Td 09/07/2009    Preventative care: Last colonoscopy: 2019 Dr. Carlean Purl, + adenomas 3 years [place] AB 2012- had fatty liver US neck 2014 CXR 10/2013 CT head 10/2003  Prior vaccinations: TD or Tdap: 2011  Influenza: declines Pneumococcal: 2010 Prevnar 13: declines Shingles/Zostavax:  declines  Allergies Allergies  Allergen Reactions  . Lotensin [Benazepril Hcl]     unknown  . Penicillins Rash    SURGICAL HISTORY He  has a past surgical history that includes Cataract extraction (Right, 2009); Shoulder arthroscopy (Right); and Colonoscopy w/ biopsies. FAMILY HISTORY His family history includes COPD in his father; Cancer in his mother. SOCIAL HISTORY He  reports that he quit smoking about 60 years ago. He has never used smokeless tobacco. He reports that he does not drink alcohol and does not use drugs.  MEDICARE WELLNESS OBJECTIVES: Physical activity:   Cardiac risk factors:   Depression/mood screen:   Depression screen [facility] 2/9 08/26/2019  Decreased Interest 0  Down, Depressed, Hopeless 0  PHQ - 2 Score 0    ADLs:  In your present state of health, do you have any difficulty performing the following activities: 08/26/2019 02/17/2019  Hearing? N N  Vision? N N  Difficulty concentrating or making decisions? N N  Walking or climbing stairs? N N  Dressing or bathing? N N  Doing errands, shopping? N N  Some recent data might be hidden    Cognitive Testing  Alert? Yes  Normal Appearance?Yes  Oriented to person? Yes  Place? Yes   Time? Yes  Recall of three objects?  2/3  Can perform simple calculations? Yes  Displays appropriate judgment?Yes  Can read the correct time from a watch face?Yes  EOL planning:   He does not have advanced directives, states wife will make the decision, if she passes will fill them out so 3 kids does not have to make choice  MMSE - Mini Mental State Exam 05/02/2018  Orientation to time 5  Orientation to Place 5  Registration 3  Attention/ Calculation 5  Recall 1  Language- name 2 objects 2  Language- repeat 1  Language- follow 3 step command 3  Language- read & follow direction 1  Write a sentence 1  Copy design 1  Total score 28     Objective:   There were no vitals taken for this visit. There is no height or weight  on file to calculate BMI.  General appearance: alert, no distress, WD/WN, male HEENT: normocephalic, sclerae anicteric, TMs pearly, nares patent, no discharge or erythema, pharynx normal Oral cavity: MMM, no lesions Neck: supple, no lymphadenopathy, no thyromegaly, no masses Heart: RRR, normal S1, S2, no murmurs Lungs: CTA bilaterally, no wheezes, rhonchi, or rales Abdomen: +bs, soft,  non tender, non distended, no masses, no hepatomegaly, no splenomegaly Musculoskeletal: nontender, no swelling, no obvious deformity Extremities: no edema, no cyanosis, no clubbing Pulses: 2+ symmetric, upper and lower extremities, normal cap refill Neurological: alert, oriented x 3, CN2-12 intact, strength normal upper extremities and lower extremities, sensation normal throughout, DTRs 2+ throughout, no cerebellar signs, gait normal Psychiatric: normal affect, behavior normal, pleasant   Medicare Attestation I have personally reviewed: The patient's medical and social history Their use of alcohol, tobacco or illicit drugs Their current medications and supplements The patient's functional ability including ADLs,fall risks, home safety risks, cognitive, and hearing and visual impairment Diet and physical activities Evidence for depression or mood disorders  The patient's weight, height, BMI, and visual acuity have been recorded in the chart.  I have made referrals, counseling, and provided education to the patient based on review of the above and I have provided the patient with a written personalized care plan for preventive services.     Garnet Sierras, NP   11/29/2019

## 2019-12-02 ENCOUNTER — Ambulatory Visit: Payer: Medicare PPO | Admitting: Adult Health Nurse Practitioner

## 2019-12-08 NOTE — Progress Notes (Signed)
MEDICARE ANNUAL WELLNESS VISIT AND FOLLOW UP Assessment:   Andrew Cochran was seen today for follow-up and medicare wellness.  Diagnoses and all orders for this visit:  Encounter for Medicare annual wellness exam Yearly  Essential hypertension Continue current medications: Change timing Atenolol 16m at bedtime (may help with fatigue?) and Losartan 563mdaily in am. Monitor blood pressure at home; call if consistently over 130/80 Continue DASH diet.   Reminder to go to the ER if any CP, SOB, nausea, dizziness, severe HA, changes vision/speech, left arm numbness and tingling and jaw pain. -     CBC with Differential/Platelet -     COMPLETE METABOLIC PANEL WITH GFR  Hyperlipidemia, mixed Continue medications: Rosuvastatin 4025mhalf tablet daily Discussed dietary and exercise modifications Low fat diet -     Lipid panel  Abnormal glucose Discussed dietary and exercise modifications  Vitamin D deficiency Continue supplementation to maintain goal of 60-100 Taking Vitamin D 5,000 IU daily  Idiopathic gout, unspecified chronicity, unspecified site Continue allopurinol 300m91mily No recent flares Discussed dietary modifications Continue to monitor  Hypothyroidism, unspecified type Taking levothyroxine 200 mcg, takes at night Reminder to take on an empty stomach 30-60mi47mefore first meal of the day. No antacid medications for 4 hours. -     TSH  BPH with obstruction/lower urinary tract symptoms, elevated PSA Doing well at this time Taking finasteride 5mg d72my Will continue to monitor Defer PSA this check  Polymyalgia rheumatica (HCC) DColemang well at this time Continue to monitor  Aortic atherosclerosis (HCC) Control blood pressure, lipids and glucose Disscused lifestyle modifications, diet & exercise Continue to monitor  Morbid obesity (HCC) Discussed dietary and exercise modifications  BMI 30.0-30.9,adult Discussed dietary and exercise modifications I&D  completed Tolerated well Aftercare discussed Discussed S&S of infection  Medication management Contrinued  Furuncle of ankle  Hx of adenomatous colonic polyps Scheduled next week  Cataract of both eyes, unspecified cataract type Monitor  Primary osteoarthritis involving multiple joints Increase exercise  Iron deficiency anemia, unspecified iron deficiency anemia type Not taking supplements Increase iron in diet check yearly  B12 deficiency No supplements at this time Defer labs today, above goal last OV.  Anxiety Taking Seroquel 25mg 1107mo 1 tab TID PRN Doing well with this 1-2 times a day Continue with benefit.   Future Appointments  Date Time Provider [place]art[place]/2021  3:00 PM [facility]k Pinto[facility] None  12/23/2020 11:00 AM McClanaGarnet SierrasAM-[facility] None     Plan:   During the course of the visit the patient was educated and counseled about appropriate screening and preventive services including:    Pneumococcal vaccine   Influenza vaccine  Td vaccine  Screening electrocardiogram  Colorectal cancer screening  Diabetes screening  Glaucoma screening  Nutrition counseling    Subjective:  Andrew BRAHIM DOLMAN9 y.o.25ale who presents for Medicare Annual Wellness Visit and 3 month follow up for HTN, hyperlipidemia, prediabetes, and vitamin D Def.   He has history of nonfocal TIA in 2008, no sequela. States memory is unchanged, some short term memory issues. No incontinence.   His blood pressure has been controlled at home, today their BP is BP: 122/80 He does not workout. He denies chest pain, shortness of breath, dizziness.   BMI is Body mass index is 31.7 kg/m., he is working on diet and exercise. . He stMarland Kitchentes his appetite has been less than normal. BMI is normal. Denies any night sweats. AB US 2012[place]  fatty liver. He had colonoscopy 2019. no diarrhea/constipation. No trouble swallowing. Last PSA 3.7 and last DRE  normal. Thyroid has been low, changed last visit and weight is up.  Wt Readings from Last 3 Encounters:  12/11/19 202 lb 6.4 oz (91.8 kg)  08/26/19 190 lb 12.8 oz (86.5 kg)  05/27/19 191 lb 9.6 oz (86.9 kg)   He has history of PMR x 2011, has been tapered off steroids, HOWEVER he would like to have have prednisone on hand that he would take AS needed for gout or PMR. Does not take daily.   He is on cholesterol medication, he is on crestor 1/2 pill daily, he is not on zetia and denies myalgias. His cholesterol is not at goal. The cholesterol last visit was:   Lab Results  Component Value Date   CHOL 186 12/11/2019   HDL 55 12/11/2019   LDLCALC 116 (H) 12/11/2019   TRIG 63 12/11/2019   CHOLHDL 3.4 12/11/2019   Last A1C in the office was normal, did have A1C of 6.4 in 2011 but has decreased with exercise/diet:  Lab Results  Component Value Date   HGBA1C 4.8 08/26/2019   Patient is on Vitamin D supplement.   Lab Results  Component Value Date   VD25OH 4 08/26/2019     He is on thyroid medication. His medication was changed last visit. He is 1/2 pill a day at night.  Patient denies nervousness, palpitations and weight changes.  Lab Results  Component Value Date   TSH 3.55 12/11/2019  .  He has 3 kids, 5 grand kids and 1 great grand kid.  Patient is on allopurinol for gout and does not report a recent flare. Lab Results  Component Value Date   LABURIC 3.8 (L) 08/26/2019    Names of Other Physician/Practitioners you currently use: 1. [facility] here for primary care 2. Dr. Katy Fitch, eye doctor, Jan 2015, over due 3. [facility], dentist, 2020 Patient Care Team: Unk Pinto, MD as PCP - General (Internal Medicine) [place], [facility] Eduardo Osier., MD as Attending Physician (Urology) Gatha Mayer, MD as Consulting Physician (Gastroenterology) Clent Jacks, MD as Consulting Physician (Ophthalmology)  Medication  Review: Current Outpatient Medications on File Prior to Visit  Medication Sig Dispense Refill  . ACCU-CHEK FASTCLIX LANCETS MISC CHECK  BLOOD  SUGAR ONE TIME DAILY 102 each 3  . ACCU-CHEK SMARTVIEW test strip CHECK  BLOOD  SUGAR ONE TIME DAILY 100 each 3  . allopurinol (ZYLOPRIM) 300 MG tablet Take 1 tablet Daily to Prevent Gout 90 tablet 3  . Ascorbic Acid (VITAMIN C PO) Take 2,000 mg by mouth daily.     Marland Kitchen atenolol (TENORMIN) 100 MG tablet TAKE 1 TABLET EVERY DAY FOR BLOOD PRESSURE 90 tablet 0  . Blood Glucose Calibration (ACCU-CHEK SMARTVIEW CONTROL) LIQD Check blood sugar 1 time daily-DX-R73.03 1 each 3  . Blood Glucose Monitoring Suppl (ACCU-CHEK NANO SMARTVIEW) w/Device KIT Check blood sugar 1 time daily-DX-R73.03 1 kit 0  . Cholecalciferol (VITAMIN D3) 125 MCG (5000 UT) CAPS Takes 1 capsule Daily    . ezetimibe (ZETIA) 10 MG tablet Take 1 tablet Daily for Cholesterol 90 tablet 0  . finasteride (PROSCAR) 5 MG tablet Take 1 tablet Daily for Prostate 90 tablet 0  . gentamicin cream (GARAMYCIN) 0.1 % Apply 1 application topically 2 (two) times daily. 15 g 1  . levothyroxine (SYNTHROID) 200 MCG tablet Take 1 tablet daily on an empty stomach with  only water for 30 minutes & no Antacid meds, Calcium or Magnesium for 4 hours & avoid Biotin 90 tablet 0  . MAGNESIUM PO Take 250 mg by mouth 3 (three) times daily.     . Omega-3 Fatty Acids (FISH OIL PO) Take 1,000 mg by mouth daily.     . predniSONE (DELTASONE) 10 MG tablet Take 1 tablet every 2 to 3 days (Must last longer than 30 days) 30 tablet 0  . QUEtiapine (SEROQUEL) 25 MG tablet Take 1/2 to 1 tablet 3 x /day for anxiety 90 tablet 0  . triamcinolone cream (KENALOG) 0.1 % Apply 1 application topically 2 (two) times daily. 30 g 1  . losartan (COZAAR) 50 MG tablet Take 1 tablet daily for BP 90 tablet 1  . rosuvastatin (CRESTOR) 40 MG tablet Take 1/2 to 1 tablet daily or as directed for Cholesterol 90 tablet 1   No current facility-administered  medications on file prior to visit.    Current Problems (verified) Patient Active Problem List   Diagnosis Date Noted  . Aortic atherosclerosis ([place]) 04/10/2017  . Morbid obesity ([place]) 07/07/2014  . DJD  07/07/2014  . Polymyalgia rheumatica ([place]) 07/07/2014  . Hx of adenomatous colonic polyps 06/18/2014  . Other abnormal glucose 09/25/2013  . Vitamin D deficiency 09/25/2013  . Medication management 09/25/2013  . Cataracts, bilateral   . Gout   . Hypertension   . Hypothyroidism   . BPH with elevated PSA   . Hyperlipidemia      Immunization History  Administered Date(s) Administered  . Influenza-Unspecified 01/27/2018  . PFIZER SARS-COV-2 Vaccination 07/04/2019, 07/29/2019  . Pneumococcal Polysaccharide-23 08/19/2008  . Td 09/07/2009    Preventative care: Last colonoscopy: 2019 Dr. Carlean Purl, + adenomas 3 years [place] AB 2012- had fatty liver US neck 2014 CXR 10/2013 CT head 10/2003  Prior vaccinations: TD or Tdap: 2011  Influenza: declines Pneumococcal: 2010 Prevnar 13: declines Shingles/Zostavax: declines   Allergies Allergies  Allergen Reactions  . Lotensin [Benazepril Hcl]     unknown  . Penicillins Rash    SURGICAL HISTORY He  has a past surgical history that includes Cataract extraction (Right, 2009); Shoulder arthroscopy (Right); and Colonoscopy w/ biopsies. FAMILY HISTORY His family history includes COPD in his father; Cancer in his mother. SOCIAL HISTORY He  reports that he quit smoking about 60 years ago. He has never used smokeless tobacco. He reports that he does not drink alcohol and does not use drugs.  MEDICARE WELLNESS OBJECTIVES: Physical activity: Current Exercise Habits: The patient has a physically strenuous job, but has no regular exercise apart from work., Exercise limited by: None identified Cardiac risk factors: Cardiac Risk Factors include: advanced age (>45mn, >>66women);dyslipidemia;hypertension;male gender;obesity (BMI  >30kg/m2);sedentary lifestyle Depression/mood screen:   [facility] 12/11/2019  Decreased Interest 0  Down, Depressed, Hopeless 0  PHQ - 2 Score 0    ADLs:  In your present state of health, do you have any difficulty performing the following activities: 12/11/2019 08/26/2019  Hearing? N N  Vision? N N  Difficulty concentrating or making decisions? N N  Walking or climbing stairs? N N  Dressing or bathing? N N  Doing errands, shopping? N N  Preparing Food and eating ? N -  Using the Toilet? N -  In the past six months, have you accidently leaked urine? N -  Do you have problems with loss of bowel control? N -  Managing your Medications? N -  Managing your Finances? N -  Housekeeping or managing your Housekeeping? N -  Some recent data might be hidden    Cognitive Testing  Alert? Yes  Normal Appearance?Yes  Oriented to person? Yes  Place? Yes   Time? Yes  Recall of three objects?  2/3  Can perform simple calculations? Yes  Displays appropriate judgment?Yes  Can read the correct time from a watch face?Yes  EOL planning: Does Patient Have a Medical Advance Directive?: Yes Type of Advance Directive: Healthcare Power of Attorney, Living will [place] in Chart?: No - copy requested He does not have advanced directives, states wife will make the decision, if she passes will fill them out so 3 kids does not have to make choice  MMSE - [place] Exam 05/02/2018  Orientation to time 5  Orientation to Place 5  Registration 3  Attention/ Calculation 5  Recall 1  Language- name 2 objects 2  Language- repeat 1  Language- follow 3 step command 3  Language- read & follow direction 1  Write a sentence 1  Copy design 1  Total score 28     Objective:   Blood pressure 122/80, pulse 86, temperature (!) 97.2 F (36.2 C), weight 202 lb 6.4 oz (91.8 kg), SpO2 96 %. Body mass index is 31.7 kg/m.  General appearance: alert, no distress,  WD/WN, male HEENT: normocephalic, sclerae anicteric, TMs pearly, nares patent, no discharge or erythema, pharynx normal Oral cavity: MMM, no lesions Neck: supple, no lymphadenopathy, no thyromegaly, no masses Heart: RRR, normal S1, S2, no murmurs Lungs: CTA bilaterally, no wheezes, rhonchi, or rales Abdomen: +bs, soft, non tender, non distended, no masses, no hepatomegaly, no splenomegaly Musculoskeletal: nontender, no swelling, no obvious deformity Extremities: no edema, no cyanosis, no clubbing Pulses: 2+ symmetric, upper and lower extremities, normal cap refill Neurological: alert, oriented x 3, CN2-12 intact, strength normal upper extremities and lower extremities, sensation normal throughout, DTRs 2+ throughout, no cerebellar signs, gait normal Psychiatric: normal affect, behavior normal, pleasant   PROCEDURE NOTE: I&D of Abscess Verbal consent obtained. Local anesthesia with 1 cc of 1% lidocaine. Site cleansed with alcohol  Incision of 0.5cm was made using a 10 blade, discharge of copious amounts of pus and serosanguinous fluid. Wound cavity was explored with curved hemostats core removed. Cleansed and dressed. After care instructions provided. Patient to return to clinic on as needed for reevaluation.   Medicare Attestation I have personally reviewed: The patient's medical and social history Their use of alcohol, tobacco or illicit drugs Their current medications and supplements The patient's functional ability including ADLs,fall risks, home safety risks, cognitive, and hearing and visual impairment Diet and physical activities Evidence for depression or mood disorders  The patient's weight, height, BMI, and visual acuity have been recorded in the chart.  I have made referrals, counseling, and provided education to the patient based on review of the above and I have provided the patient with a written personalized care plan for preventive services.     Garnet Sierras,  NP   12/15/2019

## 2019-12-11 ENCOUNTER — Other Ambulatory Visit: Payer: Self-pay

## 2019-12-11 ENCOUNTER — Ambulatory Visit (INDEPENDENT_AMBULATORY_CARE_PROVIDER_SITE_OTHER): Payer: Medicare HMO | Admitting: Adult Health Nurse Practitioner

## 2019-12-11 ENCOUNTER — Encounter: Payer: Self-pay | Admitting: Adult Health Nurse Practitioner

## 2019-12-11 VITALS — BP 122/80 | HR 86 | Temp 97.2°F | Wt 202.4 lb

## 2019-12-11 DIAGNOSIS — E782 Mixed hyperlipidemia: Secondary | ICD-10-CM | POA: Diagnosis not present

## 2019-12-11 DIAGNOSIS — Z0001 Encounter for general adult medical examination with abnormal findings: Secondary | ICD-10-CM

## 2019-12-11 DIAGNOSIS — N401 Enlarged prostate with lower urinary tract symptoms: Secondary | ICD-10-CM | POA: Diagnosis not present

## 2019-12-11 DIAGNOSIS — R6889 Other general symptoms and signs: Secondary | ICD-10-CM | POA: Diagnosis not present

## 2019-12-11 DIAGNOSIS — H269 Unspecified cataract: Secondary | ICD-10-CM

## 2019-12-11 DIAGNOSIS — E559 Vitamin D deficiency, unspecified: Secondary | ICD-10-CM

## 2019-12-11 DIAGNOSIS — Z79899 Other long term (current) drug therapy: Secondary | ICD-10-CM

## 2019-12-11 DIAGNOSIS — Z6830 Body mass index (BMI) 30.0-30.9, adult: Secondary | ICD-10-CM

## 2019-12-11 DIAGNOSIS — M1000 Idiopathic gout, unspecified site: Secondary | ICD-10-CM | POA: Diagnosis not present

## 2019-12-11 DIAGNOSIS — I700 Atherosclerosis of aorta: Secondary | ICD-10-CM | POA: Diagnosis not present

## 2019-12-11 DIAGNOSIS — N138 Other obstructive and reflux uropathy: Secondary | ICD-10-CM

## 2019-12-11 DIAGNOSIS — M353 Polymyalgia rheumatica: Secondary | ICD-10-CM

## 2019-12-11 DIAGNOSIS — Z86010 Personal history of colonic polyps: Secondary | ICD-10-CM

## 2019-12-11 DIAGNOSIS — R7309 Other abnormal glucose: Secondary | ICD-10-CM

## 2019-12-11 DIAGNOSIS — I10 Essential (primary) hypertension: Secondary | ICD-10-CM

## 2019-12-11 DIAGNOSIS — E538 Deficiency of other specified B group vitamins: Secondary | ICD-10-CM

## 2019-12-11 DIAGNOSIS — E039 Hypothyroidism, unspecified: Secondary | ICD-10-CM | POA: Diagnosis not present

## 2019-12-11 DIAGNOSIS — Z860101 Personal history of adenomatous and serrated colon polyps: Secondary | ICD-10-CM

## 2019-12-11 DIAGNOSIS — L02429 Furuncle of limb, unspecified: Secondary | ICD-10-CM

## 2019-12-11 DIAGNOSIS — R972 Elevated prostate specific antigen [PSA]: Secondary | ICD-10-CM

## 2019-12-11 DIAGNOSIS — Z0000 Encounter for general adult medical examination without abnormal findings: Secondary | ICD-10-CM

## 2019-12-11 DIAGNOSIS — N400 Enlarged prostate without lower urinary tract symptoms: Secondary | ICD-10-CM

## 2019-12-11 DIAGNOSIS — F419 Anxiety disorder, unspecified: Secondary | ICD-10-CM

## 2019-12-11 NOTE — Patient Instructions (Signed)
For your ankle.  Keep the area clean, wash with soap and water.  Signs or symptoms of infection include redness that is very tender and area gets larger.  Fever, extreme pain to the area or bad smelling discharge.     Mr. Barajas , Thank you for taking time to come for your Medicare Wellness Visit. I appreciate your ongoing commitment to your health goals. Please review the following plan we discussed and let me know if I can assist you in the future.   These are the goals we discussed: Goals   Increase physical activity, even a short walk     This is a list of the screening recommended for you and due dates:  Health Maintenance  Topic Date Due  .  Hepatitis C: One time screening is recommended by Center for Disease Control  (CDC) for  adults born from [birth year] through 1963.   Never done  . Tetanus Vaccine  09/08/2019  . Colon Cancer Screening  05/16/2021  . COVID-19 Vaccine  Completed  . Flu Shot  Discontinued  . Pneumonia vaccines  Discontinued    Next year you are due for Colonoscopy, 2022.  You are due for Tetanus vaccination  Please let us know right away if you are cut by anything metal.    Ask insurance and pharmacy about shingrix - it is a 2 part shot that we will not be getting in the office.   Suggest getting AFTER covid vaccines, have to wait at least a month This shot can make you feel bad due to such good immune response it can trigger some inflammation so take tylenol or aleve day of or day after and plan on resting.   Can go to [email or web address] for more information  Shingrix Vaccination  Two vaccines are licensed and recommended to prevent shingles in the [place] Zoster vaccine live (ZVL, Zostavax) has been in use since 2006. Recombinant zoster vaccine (RZV, Shingrix), has been in use since 2017 and is recommended by ACIP as the preferred shingles vaccine.  What Everyone Should Know about Shingles Vaccine  (Shingrix) One of the Recommended Vaccines by Disease Shingles vaccination is the only way to protect against shingles and postherpetic neuralgia (PHN), the most common complication from shingles. CDC recommends that healthy adults 50 years and older get two doses of the shingles vaccine called Shingrix (recombinant zoster vaccine), separated by 2 to 6 months, to prevent shingles and the complications from the disease. Your doctor or pharmacist can give you Shingrix as a shot in your upper arm. Shingrix provides strong protection against shingles and PHN. Two doses of Shingrix is more than 90% effective at preventing shingles and PHN. Protection stays above 85% for at least the first four years after you get vaccinated. Shingrix is the preferred vaccine, over Zostavax (zoster vaccine live), a shingles vaccine in use since 2006. Zostavax may still be used to prevent shingles in healthy adults 60 years and older. For example, you could use Zostavax if a person is allergic to Shingrix, prefers Zostavax, or requests immediate vaccination and Shingrix is unavailable. Who Should Get Shingrix? Healthy adults 50 years and older should get two doses of Shingrix, separated by 2 to 6 months. You should get Shingrix even if in the past you . had shingles  . received Zostavax  . are not sure if you had chickenpox There is no maximum age for getting Shingrix. If you had shingles in the past, you can get Shingrix to help prevent future  occurrences of the disease. There is no specific length of time that you need to wait after having shingles before you can receive Shingrix, but generally you should make sure the shingles rash has gone away before getting vaccinated. You can get Shingrix whether or not you remember having had chickenpox in the past. Studies show that more than 99% of Americans 40 years and older have had chickenpox, even if they don't remember having the disease. Chickenpox and shingles are related  because they are caused by the same virus (varicella zoster virus). After a person recovers from chickenpox, the virus stays dormant (inactive) in the body. It can reactivate years later and cause shingles. If you had Zostavax in the recent past, you should wait at least eight weeks before getting Shingrix. Talk to your healthcare provider to determine the best time to get Shingrix. Shingrix is available in [facility] and pharmacies. To find doctor's offices or pharmacies near you that offer the vaccine, visit HealthMap Vaccine FinderExternal. If you have questions about Shingrix, talk with your healthcare provider. Vaccine for Those 88 Years and Older  Shingrix reduces the risk of shingles and PHN by more than 90% in people 36 and older. CDC recommends the vaccine for healthy adults 84 and older.  Who Should Not Get Shingrix? You should not get Shingrix if you: . have ever had a severe allergic reaction to any component of the vaccine or after a dose of Shingrix  . tested negative for immunity to varicella zoster virus. If you test negative, you should get chickenpox vaccine.  . currently have shingles  . currently are pregnant or breastfeeding. Women who are pregnant or breastfeeding should wait to get Shingrix.  Marland Kitchen receive specific antiviral drugs (acyclovir, famciclovir, or valacyclovir) 24 hours before vaccination (avoid use of these antiviral drugs for 14 days after vaccination)- zoster vaccine live only If you have a minor acute (starts suddenly) illness, such as a cold, you may get Shingrix. But if you have a moderate or severe acute illness, you should usually wait until you recover before getting the vaccine. This includes anyone with a temperature of 101.56F or higher. The side effects of the Shingrix are temporary, and usually last 2 to 3 days. While you may experience pain for a few days after getting Shingrix, the pain will be less severe than having shingles and the complications  from the disease. How Well Does Shingrix Work? Two doses of Shingrix provides strong protection against shingles and postherpetic neuralgia (PHN), the most common complication of shingles. . In adults 73 to 77 years old who got two doses, Shingrix was 97% effective in preventing shingles; among adults 70 years and older, Shingrix was 91% effective.  . In adults 24 to 77 years old who got two doses, Shingrix was 91% effective in preventing PHN; among adults 70 years and older, Shingrix was 89% effective. Shingrix protection remained high (more than 85%) in people 70 years and older throughout the four years following vaccination. Since your risk of shingles and PHN increases as you get older, it is important to have strong protection against shingles in your older years. Top of Page  What Are the Possible Side Effects of Shingrix? Studies show that Shingrix is safe. The vaccine helps your body create a strong defense against shingles. As a result, you are likely to have temporary side effects from getting the shots. The side effects may affect your ability to do normal daily activities for 2 to 3 days.  Most people got a sore arm with mild or moderate pain after getting Shingrix, and some also had redness and swelling where they got the shot. Some people felt tired, had muscle pain, a headache, shivering, fever, stomach pain, or nausea. About 1 out of 6 people who got Shingrix experienced side effects that prevented them from doing regular activities. Symptoms went away on their own in about 2 to 3 days. Side effects were more common in younger people. You might have a reaction to the first or second dose of Shingrix, or both doses. If you experience side effects, you may choose to take over-the-counter pain medicine such as ibuprofen or acetaminophen. If you experience side effects from Shingrix, you should report them to the Vaccine Adverse Event Reporting System (VAERS). Your doctor might file this  report, or you can do it yourself through the VAERS websiteExternal, or by calling [phone]. If you have any questions about side effects from Shingrix, talk with your doctor. The shingles vaccine does not contain thimerosal (a preservative containing mercury). Top of Page  When Should I See a Doctor Because of the Side Effects I Experience From Shingrix? In clinical trials, Shingrix was not associated with serious adverse events. In fact, serious side effects from vaccines are extremely rare. For example, for every 1 million doses of a vaccine given, only one or two people may have a severe allergic reaction. Signs of an allergic reaction happen within minutes or hours after vaccination and include hives, swelling of the face and throat, difficulty breathing, a fast heartbeat, dizziness, or weakness. If you experience these or any other life-threatening symptoms, see a doctor right away. Shingrix causes a strong response in your immune system, so it may produce short-term side effects more intense than you are used to from other vaccines. These side effects can be uncomfortable, but they are expected and usually go away on their own in 2 or 3 days. Top of Page  How Can I Pay For Shingrix? There are several ways shingles vaccine may be paid for: Medicare . Medicare Part D plans cover the shingles vaccine, but there may be a cost to you depending on your plan. There may be a copay for the vaccine, or you may need to pay in full then get reimbursed for a certain amount.  . Medicare Part B does not cover the shingles vaccine. Medicaid . Medicaid may or may not cover the vaccine. Contact your insurer to find out. Private health insurance . Many private health insurance plans will cover the vaccine, but there may be a cost to you depending on your plan. Contact your insurer to find out. Vaccine assistance programs . Some pharmaceutical companies provide vaccines to eligible adults who cannot  afford them. You may want to check with the vaccine manufacturer, [employer], about Shingrix. If you do not currently have health insurance, learn more about affordable health coverage optionsExternal. To find doctor's offices or pharmacies near you that offer the vaccine, visit HealthMap Vaccine FinderExternal.

## 2019-12-12 LAB — LIPID PANEL
Cholesterol: 186 mg/dL (ref <200)
HDL: 55 mg/dL (ref >=40)
LDL Cholesterol (Calc): 116 mg/dL (calc) — ABNORMAL HIGH
Non-HDL Cholesterol (Calc): 131 mg/dL (calc) — ABNORMAL HIGH (ref <130)
Total CHOL/HDL Ratio: 3.4 (calc) (ref <5.0)
Triglycerides: 63 mg/dL (ref <150)

## 2019-12-12 LAB — CBC WITH DIFFERENTIAL/PLATELET
Absolute Monocytes: 443 cells/uL (ref 200–950)
Basophils Absolute: 53 cells/uL (ref 0–200)
Basophils Relative: 0.7 %
Eosinophils Absolute: 150 cells/uL (ref 15–500)
Eosinophils Relative: 2 %
HCT: 43.7 % (ref 38.5–50.0)
Hemoglobin: 14.7 g/dL (ref 13.2–17.1)
Lymphs Abs: 1418 cells/uL (ref 850–3900)
MCH: 30.8 pg (ref 27.0–33.0)
MCHC: 33.6 g/dL (ref 32.0–36.0)
MCV: 91.6 fL (ref 80.0–100.0)
MPV: 10.2 fL (ref 7.5–12.5)
Monocytes Relative: 5.9 %
Neutro Abs: 5438 cells/uL (ref 1500–7800)
Neutrophils Relative %: 72.5 %
Platelets: 223 10*3/uL (ref 140–400)
RBC: 4.77 10*6/uL (ref 4.20–5.80)
RDW: 13.1 % (ref 11.0–15.0)
Total Lymphocyte: 18.9 %
WBC: 7.5 10*3/uL (ref 3.8–10.8)

## 2019-12-12 LAB — COMPLETE METABOLIC PANEL WITH GFR
AG Ratio: 1.6 (calc) (ref 1.0–2.5)
ALT: 26 U/L (ref 9–46)
AST: 28 U/L (ref 10–35)
Albumin: 4 g/dL (ref 3.6–5.1)
Alkaline phosphatase (APISO): 90 U/L (ref 35–144)
BUN: 10 mg/dL (ref 7–25)
CO2: 31 mmol/L (ref 20–32)
Calcium: 9.4 mg/dL (ref 8.6–10.3)
Chloride: 104 mmol/L (ref 98–110)
Creat: 0.88 mg/dL (ref 0.70–1.18)
GFR, Est African American: 96 mL/min/{1.73_m2} (ref >=60)
GFR, Est Non African American: 83 mL/min/{1.73_m2} (ref >=60)
Globulin: 2.5 g/dL (calc) (ref 1.9–3.7)
Glucose, Bld: 86 mg/dL (ref 65–99)
Potassium: 4.4 mmol/L (ref 3.5–5.3)
Sodium: 140 mmol/L (ref 135–146)
Total Bilirubin: 0.8 mg/dL (ref 0.2–1.2)
Total Protein: 6.5 g/dL (ref 6.1–8.1)

## 2019-12-12 LAB — TSH: TSH: 3.55 mIU/L (ref 0.40–4.50)

## 2019-12-15 NOTE — Progress Notes (Signed)
Patient is aware of lab results and changes to cholesterol dosing. -Andrew Cochran

## 2020-01-21 ENCOUNTER — Other Ambulatory Visit: Payer: Self-pay | Admitting: Internal Medicine

## 2020-01-21 ENCOUNTER — Other Ambulatory Visit: Payer: Self-pay | Admitting: *Deleted

## 2020-01-21 DIAGNOSIS — M159 Polyosteoarthritis, unspecified: Secondary | ICD-10-CM

## 2020-01-21 DIAGNOSIS — E039 Hypothyroidism, unspecified: Secondary | ICD-10-CM

## 2020-01-21 DIAGNOSIS — E782 Mixed hyperlipidemia: Secondary | ICD-10-CM

## 2020-01-21 MED ORDER — EZETIMIBE 10 MG PO TABS: ORAL_TABLET | ORAL | 0 refills | Status: DC

## 2020-01-21 MED ORDER — PREDNISONE 10 MG PO TABS: ORAL_TABLET | ORAL | 0 refills | Status: DC

## 2020-01-21 MED ORDER — LEVOTHYROXINE SODIUM 200 MCG PO TABS: ORAL_TABLET | ORAL | 0 refills | Status: DC

## 2020-01-21 MED ORDER — ATENOLOL 100 MG PO TABS: ORAL_TABLET | ORAL | 0 refills | Status: DC

## 2020-01-21 MED ORDER — ALLOPURINOL 300 MG PO TABS: ORAL_TABLET | ORAL | 0 refills | Status: DC

## 2020-02-20 ENCOUNTER — Other Ambulatory Visit: Payer: Self-pay | Admitting: Physician Assistant

## 2020-02-23 ENCOUNTER — Other Ambulatory Visit: Payer: Self-pay | Admitting: *Deleted

## 2020-02-23 MED ORDER — ACCU-CHEK AVIVA PLUS VI STRP: ORAL_STRIP | 3 refills | Status: DC

## 2020-02-25 ENCOUNTER — Other Ambulatory Visit: Payer: Self-pay | Admitting: *Deleted

## 2020-02-25 MED ORDER — MICROLET LANCETS MISC: 3 refills | Status: DC

## 2020-03-03 ENCOUNTER — Other Ambulatory Visit: Payer: Self-pay | Admitting: Internal Medicine

## 2020-03-03 DIAGNOSIS — E782 Mixed hyperlipidemia: Secondary | ICD-10-CM

## 2020-03-17 ENCOUNTER — Encounter: Payer: Self-pay | Admitting: Internal Medicine

## 2020-03-17 ENCOUNTER — Other Ambulatory Visit: Payer: Self-pay

## 2020-03-17 ENCOUNTER — Ambulatory Visit (INDEPENDENT_AMBULATORY_CARE_PROVIDER_SITE_OTHER): Payer: Medicare Other | Admitting: Internal Medicine

## 2020-03-17 VITALS — BP 140/72 | HR 59 | Temp 97.2°F | Resp 16 | Ht 66.0 in | Wt 202.4 lb

## 2020-03-17 DIAGNOSIS — Z125 Encounter for screening for malignant neoplasm of prostate: Secondary | ICD-10-CM

## 2020-03-17 DIAGNOSIS — M21612 Bunion of left foot: Secondary | ICD-10-CM

## 2020-03-17 DIAGNOSIS — E559 Vitamin D deficiency, unspecified: Secondary | ICD-10-CM | POA: Diagnosis not present

## 2020-03-17 DIAGNOSIS — Z8249 Family history of ischemic heart disease and other diseases of the circulatory system: Secondary | ICD-10-CM

## 2020-03-17 DIAGNOSIS — E782 Mixed hyperlipidemia: Secondary | ICD-10-CM

## 2020-03-17 DIAGNOSIS — E039 Hypothyroidism, unspecified: Secondary | ICD-10-CM

## 2020-03-17 DIAGNOSIS — I700 Atherosclerosis of aorta: Secondary | ICD-10-CM | POA: Diagnosis not present

## 2020-03-17 DIAGNOSIS — Z79899 Other long term (current) drug therapy: Secondary | ICD-10-CM

## 2020-03-17 DIAGNOSIS — Z136 Encounter for screening for cardiovascular disorders: Secondary | ICD-10-CM

## 2020-03-17 DIAGNOSIS — Z1211 Encounter for screening for malignant neoplasm of colon: Secondary | ICD-10-CM

## 2020-03-17 DIAGNOSIS — I10 Essential (primary) hypertension: Secondary | ICD-10-CM | POA: Diagnosis not present

## 2020-03-17 DIAGNOSIS — M1000 Idiopathic gout, unspecified site: Secondary | ICD-10-CM

## 2020-03-17 DIAGNOSIS — Z0001 Encounter for general adult medical examination with abnormal findings: Secondary | ICD-10-CM

## 2020-03-17 DIAGNOSIS — R7309 Other abnormal glucose: Secondary | ICD-10-CM | POA: Diagnosis not present

## 2020-03-17 DIAGNOSIS — Z0000 Encounter for general adult medical examination without abnormal findings: Secondary | ICD-10-CM | POA: Diagnosis not present

## 2020-03-17 DIAGNOSIS — N138 Other obstructive and reflux uropathy: Secondary | ICD-10-CM

## 2020-03-17 DIAGNOSIS — N401 Enlarged prostate with lower urinary tract symptoms: Secondary | ICD-10-CM

## 2020-03-17 NOTE — Progress Notes (Signed)
Annual  Screening/Preventative Visit  & Comprehensive Evaluation & Examination     This very nice 79 y.o.  MWM presents for a Screening /Preventative Visit & comprehensive evaluation and management of multiple medical co-morbidities.  Patient has been followed for HTN, HLD, Hypothyroidism, Prediabetes and Vitamin D Deficiency. Abd U/S  in 2012 demonstrated Aortic Atherosclerosis.     Patient also is c/o about  painful bunion of the Left foot and requests that it be removed.      HTN predates circa 1997. Patient's BP has been controlled at home.  Today's BP is high normal at goal -  140/72. Patient denies any cardiac symptoms as chest pain, palpitations, shortness of breath, dizziness or ankle swelling.     Patient's hyperlipidemia is not controlled with diet and medications. Patient denies myalgias or other medication SE's. Last lipids were not at goal:  Lab Results  Component Value Date   CHOL 186 12/11/2019   HDL 55 12/11/2019   LDLCALC 116 (H) 12/11/2019   TRIG 63 12/11/2019   CHOLHDL 3.4 12/11/2019       Patient has moderate Obesity (BMI 32.67)  And is proactively followed for prediabetes (A1c 6.4% /2011)and patient denies reactive hypoglycemic symptoms, visual blurring, diabetic polys or paresthesias. Last A1c was Normal & at goal:  Lab Results  Component Value Date   HGBA1C 4.8 08/26/2019                                            Patienthas been on thyroid replacement since was dx'd Hypothyroid in 2002 and .      Finally, patient has history of Vitamin D Deficiency ("36" /2008) and last vitamin D was at goal:  Lab Results  Component Value Date   VD25OH 76 08/26/2019    Current Outpatient Medications on File Prior to Visit  Medication Sig  . allopurinol (ZYLOPRIM) 300 MG  Take 1 tablet Daily to Prevent Gout  . Ascorbic Acid (VITAMIN C PO) Take 2,000 mg by mouth daily.   Marland Kitchen atenolol (TENORMIN) 100 MG  TAKE 1 TABLET EVERY DAY   . VITAMIN D  (5000 ) CAPS Takes 1  capsule Daily  . ezetimibe  10 MG tablet TAKE 1 TABLET  DAILY   . finasteride  5 MG tablet Take 1 tablet Daily for Prostate  . gentamicin cream 0.1 % Apply 1 application topically 2 (two) times daily.  Marland Kitchen levothyroxine  200 MCG tablet Take 1 tablet daily   . losartan  50 MG tablet Take 1 tablet daily for BP  . MAGNESIUM PO Take 250 mg by mouth 3 (three) times daily.   . Omega-3 FISH OIL  Take 1,000 mg by mouth daily.   . predniSONE  10 MG tablet Take 1 tablet every 2 to 3 days   . QUEtiapine (SEROQUEL) 25 MG  Take 1/2 to 1 tablet 3 x /day for anxiety  . rosuvastatin  40 MG tablet Take 1/2 to 1 tablet daily or as directed for Cholesterol  . triamcinolone cream 0.1 % Apply 1 application topically 2 (two) times daily.    Allergies  Allergen Reactions  . Lotensin [Benazepril Hcl]     unknown  . Penicillins Rash   Past Medical History:  Diagnosis Date  . BPH with elevated PSA   . Cataracts, bilateral   . Diverticulosis   . Gout   .  Hx of adenomatous colonic polyps 06/18/2014  . Hyperlipidemia   . Hypertension   . Prediabetes   . Thyroid disease   . Tubular adenoma 02/17/2003  . Umbilical hernia 74/04/8785   Health Maintenance  Topic Date Due  . Hepatitis C Screening  Never done  . TETANUS/TDAP  09/08/2019  . COLONOSCOPY  05/16/2021  . COVID-19 Vaccine  Completed  . INFLUENZA VACCINE  Discontinued  . PNA vac Low Risk Adult  Discontinued   Immunization History  Administered Date(s) Administered  . Influenza-Unspecified 01/27/2018  . PFIZER SARS-COV-2 Vaccination 07/04/2019, 07/29/2019  . Pneumococcal Polysaccharide-23 08/19/2008  . Td 09/07/2009   Last Colon -   Past Surgical History:  Procedure Laterality Date  . CATARACT EXTRACTION Right 2009  . COLONOSCOPY W/ BIOPSIES    . SHOULDER ARTHROSCOPY Right    1979   Family History  Problem Relation Age of Onset  . Cancer Mother   . COPD Father   . Colon cancer Neg Hx   . Esophageal cancer Neg Hx   . Rectal cancer  Neg Hx   . Stomach cancer Neg Hx    Social History   Socioeconomic History  . Marital status: Married    Spouse name: Vaughan Basta  . Number of children: 3  Occupational History  . Retired Freight forwarder  Tobacco Use  . Smoking status: Former Smoker    Quit date: 05/30/1959    Years since quitting: 60.8  . Smokeless tobacco: Never Used  Vaping Use  . Vaping Use: Never used  Substance and Sexual Activity  . Alcohol use: No  . Drug use: No  . Sexual activity: Not on file    ROS Constitutional: Denies fever, chills, weight loss/gain, headaches, insomnia,  night sweats or change in appetite. Does c/o fatigue. Eyes: Denies redness, blurred vision, diplopia, discharge, itchy or watery eyes.  ENT: Denies discharge, congestion, post nasal drip, epistaxis, sore throat, earache, hearing loss, dental pain, Tinnitus, Vertigo, Sinus pain or snoring.  Cardio: Denies chest pain, palpitations, irregular heartbeat, syncope, dyspnea, diaphoresis, orthopnea, PND, claudication or edema Respiratory: denies cough, dyspnea, DOE, pleurisy, hoarseness, laryngitis or wheezing.  Gastrointestinal: Denies dysphagia, heartburn, reflux, water brash, pain, cramps, nausea, vomiting, bloating, diarrhea, constipation, hematemesis, melena, hematochezia, jaundice or hemorrhoids Genitourinary: Denies dysuria, frequency,  discharge, hematuria or flank pain. Has urgency, nocturia x 2-3 & occasional hesitancy. Musculoskeletal: Denies arthralgia, myalgia, stiffness, Jt. Swelling, pain, limp or strain/sprain. Denies Falls. Skin: Denies puritis, rash, hives, warts, acne, eczema or change in skin lesion Neuro: No weakness, tremor, incoordination, spasms, paresthesia or pain Psychiatric: Denies confusion, memory loss or sensory loss. Denies Depression. Endocrine: Denies change in weight, skin, hair change, nocturia, and paresthesia, diabetic polys, visual blurring or hyper / hypo glycemic episodes.  Heme/Lymph: No excessive bleeding,  bruising or enlarged lymph nodes.  Physical Exam  BP 140/72   Pulse (!) 59   Temp (!) 97.2 F (36.2 C)   Resp 16   Ht 5\' 6"  (1.676 m)   Wt 202 lb 6.4 oz (91.8 kg)   SpO2 98%   BMI 32.67 kg/m   General Appearance:  Over  nourished and in no apparent distress.  Eyes: PERRLA, EOMs, conjunctiva no swelling or erythema, normal fundi and vessels. Sinuses: No frontal/maxillary tenderness ENT/Mouth: EACs patent / TMs  nl. Nares clear without erythema, swelling, mucoid exudates. Oral hygiene is good. No erythema, swelling, or exudate. Tongue normal, non-obstructing. Tonsils not swollen or erythematous. Hearing normal.  Neck: Supple, thyroid not palpable. No bruits,  nodes or JVD. Respiratory: Respiratory effort normal.  BS equal and clear bilateral without rales, rhonci, wheezing or stridor. Cardio: Heart sounds are normal with regular rate and rhythm and no murmurs, rubs or gallops. Peripheral pulses are normal and equal bilaterally without edema. No aortic or femoral bruits. Chest: symmetric with normal excursions and percussion.  Abdomen: Soft, with Nl bowel sounds. Nontender, no guarding, rebound, hernias, masses, or organomegaly.  Lymphatics: Non tender without lymphadenopathy.  Musculoskeletal: Full ROM all peripheral extremities,  5/5 strength, and normal gait. Skin: Warm and dry without rashes, lesions, cyanosis, clubbing or  ecchymosis.  There is a pink firm tender raised 2 cm x 2 cm thickened skin area of the medial Left foot   Neuro: Cranial nerves intact, reflexes equal bilaterally. Normal muscle tone, no cerebellar symptoms. Sensation intact.  Pysch: Alert and oriented X 3 with normal affect, insight and judgment appropriate.   Procedure ( CPT 23536)    After informed consent, the lesion of the Left medial foot as prepped aseptically with alcohol  & then infilkterated with 2 ml of Marcaine 0.5% intradermal & sub-cutaneous Then with a #10 scalpel, the lesion was excised by shave  techique.  Then the exposed wound measuring ~ 2 cm x 2 cm was deeply hyfrecated. Then Derma-flex was used to seal the wound and control bleeding. Wound was covered with Abx ung and non- stick bandage and secured with "paper " tape. Patient was instructed in post-op care and advised to purchase wider shoes and also to purchase arch supports.     Assessment and Plan  1. Annual Preventative/Screening Exam    2. Essential hypertension  - EKG 12-Lead - [place], RETROPERITNL ABD,  LTD - Urinalysis, Routine w reflex microscopic - Microalbumin / creatinine urine ratio - CBC with Differential/Platelet - COMPLETE METABOLIC PANEL WITH GFR - Magnesium - TSH  3. Hyperlipidemia, mixed  - EKG 12-Lead - [place], RETROPERITNL ABD,  LTD - Lipid panel - TSH  4. Abnormal glucose  - EKG 12-Lead - [place], RETROPERITNL ABD,  LTD - Hemoglobin A1c - Insulin, random  5. Vitamin D deficiency  - VITAMIN D 25 Hydroxy  6. Idiopathic gout  - Uric acid  7. Hypothyroidism  - TSH  8. BPH with obstruction/lower urinary tract symptoms  - PSA  9. Aortic atherosclerosis (HCC)  - EKG 12-Lead - [place], RETROPERITNL ABD,  LTD  10. Prostate cancer screening  - PSA  11. Screening for colorectal cancer  - POC Hemoccult Bld/Stl   12. Screening for ischemic heart disease  - EKG 12-Lead  13. FHx: heart disease  - EKG 12-Lead - [place], RETROPERITNL ABD,  LTD  14. Screening for AAA (aortic abdominal aneurysm)  - [place], RETROPERITNL ABD,  LTD  15. Medication management  - Urinalysis, Routine w reflex microscopic - Microalbumin / creatinine urine ratio - CBC with Differential/Platelet - COMPLETE METABOLIC PANEL WITH GFR - Magnesium - Lipid panel - TSH - Hemoglobin A1c - Insulin, random - VITAMIN D 25 Hydroxy   16. Bunion of left foot        Patient was counseled in prudent diet, weight control to achieve/maintain BMI less than 25, BP monitoring, regular exercise and medications as discussed.   Discussed med effects and SE's. Routine screening labs and tests as requested with regular follow-up as recommended. Over 40 minutes of exam, counseling, chart review and high complex critical decision making was performed   Kirtland Bouchard, MD

## 2020-03-17 NOTE — Patient Instructions (Signed)

## 2020-03-18 NOTE — Progress Notes (Signed)
========================================================== °========================================================== ° °-   PSA - stable - actually slightly lower than last year   -  All Else - CBC - Electrolytes - Liver - Magnesium & Thyroid    - all  Normal / OK ===========================================================   - Kidney functions BUN, Creatinine & GFR - All Normal & OK ==========================================================  - Total Chol = 182 -= Great   - but LDL Cho l= 105 - slightly elevated  (Ideal or Goal is less than 70 )  - So . . . . . . . . . . - Recommend  a stricter low cholesterol diet   - Cholesterol only comes from animal sources  - ie. meat, dairy, egg yolks  - Eat all the vegetables you want.  - Avoid meat, especially red meat - Beef AND Pork .  - Avoid cheese & dairy - milk & ice cream.     - Cheese is the most concentrated form of trans-fats which  is the worst thing to clog up our arteries.   - Veggie cheese is OK which can be found in the fresh  produce section at [employer] or [employer] or [employer] ==========================================================  - A1c = 4.8% - excellent - No Diabetes ! ==========================================================  - Vitamin D = 71 - Excellent  ==========================================================  - Keep up the [place] Work ! ==========================================================

## 2020-03-19 LAB — HEMOGLOBIN A1C
Hgb A1c MFr Bld: 4.8 % of total Hgb (ref <5.7)
Mean Plasma Glucose: 91 (calc)
eAG (mmol/L): 5 (calc)

## 2020-03-19 LAB — CBC WITH DIFFERENTIAL/PLATELET
Absolute Monocytes: 409 cells/uL (ref 200–950)
Basophils Absolute: 33 cells/uL (ref 0–200)
Basophils Relative: 0.5 %
Eosinophils Absolute: 132 cells/uL (ref 15–500)
Eosinophils Relative: 2 %
HCT: 40.8 % (ref 38.5–50.0)
Hemoglobin: 14.2 g/dL (ref 13.2–17.1)
Lymphs Abs: 1531 cells/uL (ref 850–3900)
MCH: 31.5 pg (ref 27.0–33.0)
MCHC: 34.8 g/dL (ref 32.0–36.0)
MCV: 90.5 fL (ref 80.0–100.0)
MPV: 10.2 fL (ref 7.5–12.5)
Monocytes Relative: 6.2 %
Neutro Abs: 4495 cells/uL (ref 1500–7800)
Neutrophils Relative %: 68.1 %
Platelets: 205 10*3/uL (ref 140–400)
RBC: 4.51 10*6/uL (ref 4.20–5.80)
RDW: 13.2 % (ref 11.0–15.0)
Total Lymphocyte: 23.2 %
WBC: 6.6 10*3/uL (ref 3.8–10.8)

## 2020-03-19 LAB — COMPLETE METABOLIC PANEL WITH GFR
AG Ratio: 1.7 (calc) (ref 1.0–2.5)
ALT: 27 U/L (ref 9–46)
AST: 27 U/L (ref 10–35)
Albumin: 3.9 g/dL (ref 3.6–5.1)
Alkaline phosphatase (APISO): 74 U/L (ref 35–144)
BUN: 13 mg/dL (ref 7–25)
CO2: 28 mmol/L (ref 20–32)
Calcium: 9.3 mg/dL (ref 8.6–10.3)
Chloride: 106 mmol/L (ref 98–110)
Creat: 0.85 mg/dL (ref 0.70–1.18)
GFR, Est African American: 97 mL/min/{1.73_m2} (ref >=60)
GFR, Est Non African American: 84 mL/min/{1.73_m2} (ref >=60)
Globulin: 2.3 g/dL (calc) (ref 1.9–3.7)
Glucose, Bld: 108 mg/dL — ABNORMAL HIGH (ref 65–99)
Potassium: 4 mmol/L (ref 3.5–5.3)
Sodium: 141 mmol/L (ref 135–146)
Total Bilirubin: 0.7 mg/dL (ref 0.2–1.2)
Total Protein: 6.2 g/dL (ref 6.1–8.1)

## 2020-03-19 LAB — URINALYSIS, ROUTINE W REFLEX MICROSCOPIC
Bacteria, UA: NONE SEEN /HPF
Bilirubin Urine: NEGATIVE
Glucose, UA: NEGATIVE
Hgb urine dipstick: NEGATIVE
Hyaline Cast: NONE SEEN /LPF
Leukocytes,Ua: NEGATIVE
Nitrite: NEGATIVE
Specific Gravity, Urine: 1.029 (ref 1.001–1.03)
Squamous Epithelial / HPF: NONE SEEN /HPF (ref <=5)
pH: 5.5 (ref 5.0–8.0)

## 2020-03-19 LAB — MICROALBUMIN / CREATININE URINE RATIO
Creatinine, Urine: 269 mg/dL (ref 20–320)
Microalb Creat Ratio: 290 mcg/mg creat — ABNORMAL HIGH (ref <30)
Microalb, Ur: 77.9 mg/dL

## 2020-03-19 LAB — LIPID PANEL
Cholesterol: 182 mg/dL (ref <200)
HDL: 47 mg/dL (ref >=40)
LDL Cholesterol (Calc): 105 mg/dL (calc) — ABNORMAL HIGH
Non-HDL Cholesterol (Calc): 135 mg/dL (calc) — ABNORMAL HIGH (ref <130)
Total CHOL/HDL Ratio: 3.9 (calc) (ref <5.0)
Triglycerides: 182 mg/dL — ABNORMAL HIGH (ref <150)

## 2020-03-19 LAB — TSH: TSH: 3.35 mIU/L (ref 0.40–4.50)

## 2020-03-19 LAB — MAGNESIUM: Magnesium: 1.8 mg/dL (ref 1.5–2.5)

## 2020-03-19 LAB — URIC ACID: Uric Acid, Serum: 4.3 mg/dL (ref 4.0–8.0)

## 2020-03-19 LAB — PSA: PSA: 5.18 ng/mL — ABNORMAL HIGH (ref <=4.0)

## 2020-03-19 LAB — VITAMIN D 25 HYDROXY (VIT D DEFICIENCY, FRACTURES): Vit D, 25-Hydroxy: 71 ng/mL (ref 30–100)

## 2020-03-19 LAB — INSULIN, RANDOM: Insulin: 15.8 u[IU]/mL

## 2020-03-23 ENCOUNTER — Other Ambulatory Visit: Payer: Self-pay | Admitting: Internal Medicine

## 2020-03-23 DIAGNOSIS — I10 Essential (primary) hypertension: Secondary | ICD-10-CM

## 2020-03-23 DIAGNOSIS — E039 Hypothyroidism, unspecified: Secondary | ICD-10-CM

## 2020-03-23 DIAGNOSIS — M1000 Idiopathic gout, unspecified site: Secondary | ICD-10-CM

## 2020-04-05 ENCOUNTER — Other Ambulatory Visit: Payer: Self-pay | Admitting: Internal Medicine

## 2020-04-05 DIAGNOSIS — M8949 Other hypertrophic osteoarthropathy, multiple sites: Secondary | ICD-10-CM

## 2020-04-05 DIAGNOSIS — M159 Polyosteoarthritis, unspecified: Secondary | ICD-10-CM

## 2020-04-07 ENCOUNTER — Encounter: Payer: Self-pay | Admitting: Adult Health Nurse Practitioner

## 2020-04-07 ENCOUNTER — Other Ambulatory Visit: Payer: Self-pay

## 2020-04-07 ENCOUNTER — Ambulatory Visit (INDEPENDENT_AMBULATORY_CARE_PROVIDER_SITE_OTHER): Payer: Medicare Other | Admitting: Adult Health Nurse Practitioner

## 2020-04-07 VITALS — BP 126/88 | Temp 97.7°F | Ht 66.0 in | Wt 202.0 lb

## 2020-04-07 DIAGNOSIS — I10 Essential (primary) hypertension: Secondary | ICD-10-CM | POA: Diagnosis not present

## 2020-04-07 DIAGNOSIS — R600 Localized edema: Secondary | ICD-10-CM

## 2020-04-07 DIAGNOSIS — L089 Local infection of the skin and subcutaneous tissue, unspecified: Secondary | ICD-10-CM | POA: Diagnosis not present

## 2020-04-07 MED ORDER — SULFAMETHOXAZOLE-TRIMETHOPRIM 800-160 MG PO TABS: 1.0000 | ORAL_TABLET | Freq: Two times a day (BID) | ORAL | 0 refills | Status: DC

## 2020-04-07 NOTE — Patient Instructions (Signed)
   Clean the area to left foot with mild soap, dove or dawn, once a day.  Apply antibiotic ointment and cover with non-adherant dressing.  We sent in antibiotics for you to take every 12 hours for 10 days.   We will follow up in 2 weeks for your foot and to discussed weight loss.

## 2020-04-07 NOTE — Progress Notes (Signed)
Assessment and Plan:  Andrew Cochran was seen today for wound infection.  Diagnoses and all orders for this visit:  Left foot infection -     sulfamethoxazole-trimethoprim (BACTRIM DS) 800-160 MG tablet; Take 1 tablet by mouth 2 (two) times daily. Discussed wound care Keep covered, WEAR SOCKS OR HOUSE SHOES TO KEEP AREA CLEAN DO NOT WALK AROUND BAREFOOT.  Lower extremity edema, +1 Increase water intake Discussed elevated leg above heart when resting  Essential hypertension Continue current medications: atenolol 100mg, losartan 50mg Monitor blood pressure at home; call if consistently over 130/80 Continue DASH diet.   Reminder to go to the ER if any CP, SOB, nausea, dizziness, severe HA, changes vision/speech, left arm numbness and tingling and jaw pain.    Further disposition pending results of labs. Discussed med's effects and SE's.   Over 30 minutes of face to face interview exam, counseling, chart review, and critical decision making was performed.   Future Appointments  Date Time Provider Department Center  04/27/2020  9:30 AM , , NP [facility] None  06/17/2020 11:00 AM Corbett, Ashley, NP [facility] None  09/16/2020 11:30 AM McKeown, William, MD [facility] None  12/23/2020 11:00 AM , , NP [facility] None  03/24/2021  2:00 PM McKeown, William, MD [facility] None    ------------------------------------------------------------------------------------------------------------------   HPI 79 y.o.male presents for evaluation of right foot wound.  He reports he had treatment for this spot on 03/17/20.    From previous OV: After informed consent, the lesion of the Left medial foot as prepped aseptically with alcohol  & then infilkterated with 2 ml of Marcaine 0.5% intradermal & sub-cutaneous Then with a #10 scalpel, the lesion was excised by shave techique.  Then the exposed wound measuring ~ 2 cm x 2 cm was deeply hyfrecated. Then Derma-flex was used to seal  the wound and control bleeding. Wound was covered with Abx ung and non- stick bandage and secured with "paper " tape. Patient was instructed in post-op care and advised to purchase wider shoes and also to purchase arch supports.  Area treated on ankle last OV, improved essentially resolved.  He now has a spot to lateral left foot. He was cleansing area and changing bandage, ever other day.  Using mild soap and neosporin to the area.  He reports about 1.5 weeks ago he noticed it was getting increasingly red and sore.  He typically does not wear sock at home, walk around barefoot.  He wanted this to be looked at.    Past Medical History:  Diagnosis Date  . BPH with elevated PSA   . Cataracts, bilateral   . Diverticulosis   . Gout   . Hx of adenomatous colonic polyps 06/18/2014  . Hyperlipidemia   . Hypertension   . Prediabetes   . Thyroid disease   . Tubular adenoma 02/17/2003  . Umbilical hernia 04/21/2014     Allergies  Allergen Reactions  . Lotensin [Benazepril Hcl]     unknown  . Penicillins Rash    Current Outpatient Medications on File Prior to Visit  Medication Sig  . allopurinol (ZYLOPRIM) 300 MG tablet Take      1 tablet      Daily      to Prevent Gout  . Ascorbic Acid (VITAMIN C PO) Take 2,000 mg by mouth daily.   . atenolol (TENORMIN) 100 MG tablet Take     1 tablet     Daily      for BP  . Blood Glucose Calibration (  ACCU-CHEK SMARTVIEW CONTROL) LIQD Check blood sugar 1 time daily-DX-R73.03  . Blood Glucose Monitoring Suppl (ACCU-CHEK AVIVA PLUS) w/Device KIT CHECK BLOOD SUGAR 1 TIME  DAILY  . Cholecalciferol (VITAMIN D3) 125 MCG (5000 UT) CAPS Takes 1 capsule Daily  . ezetimibe (ZETIA) 10 MG tablet TAKE 1 TABLET BY MOUTH  DAILY FOR CHOLESTEROL  . finasteride (PROSCAR) 5 MG tablet Take 1 tablet Daily for Prostate  . glucose blood (ACCU-CHEK AVIVA PLUS) test strip Check blood sugar 1 time a day-DX-R73.09  . levothyroxine (SYNTHROID) 200 MCG tablet Take      1 tablet        Daily       on an empty stomach with only water for 30 minutes & no Antacid meds, Calcium or Magnesium for 4 hours & avoid Biotin  . MAGNESIUM PO Take 250 mg by mouth 3 (three) times daily.   . Microlet Lancets MISC Check blood sugar 1 time daily-DX-R73.09  . Omega-3 Fatty Acids (FISH OIL PO) Take 1,000 mg by mouth daily.   . predniSONE (DELTASONE) 10 MG tablet Take      1 tablet       every 2 to 3 days >>>>>> MUST LAST LONGER THAN 30  DAYS  . QUEtiapine (SEROQUEL) 25 MG tablet Take 1/2 to 1 tablet 3 x /day for anxiety  . losartan (COZAAR) 50 MG tablet Take 1 tablet daily for BP  . rosuvastatin (CRESTOR) 40 MG tablet Take 1/2 to 1 tablet daily or as directed for Cholesterol   No current facility-administered medications on file prior to visit.    ROS: all negative except above.   Physical Exam:  BP 126/88   Temp 97.7 F (36.5 C)   Ht 5' 6" (1.676 m)   Wt 202 lb (91.6 kg)   BMI 32.60 kg/m   General Appearance: Well nourished, in no apparent distress. Eyes: PERRLA, EOMs, conjunctiva no swelling or erythema Sinuses: No Frontal/maxillary tenderness ENT/Mouth: Ext aud canals clear, TMs without erythema, bulging. No erythema, swelling, or exudate on post pharynx.  Tonsils not swollen or erythematous. Hearing normal.  Neck: Supple, thyroid normal.  Respiratory: Respiratory effort normal, BS equal bilaterally without rales, rhonchi, wheezing or stridor.  Cardio: RRR with no MRGs. Brisk peripheral pulses without edema.  Abdomen: Soft, + BS.  Non tender, no guarding, rebound, hernias, masses. Lymphatics: Non tender without lymphadenopathy.  Musculoskeletal: Full ROM, 5/5 strength, normal gait.  Skin: Warm, dry without rashes, lesions, ecchymosis. Quarter size wound, erythema noted surrounding area. Wound bed is dark, dry firm.  Tender to touch. Neuro: Cranial nerves intact. Normal muscle tone, no cerebellar symptoms. Sensation intact.  Psych: Awake and oriented X 3, normal affect,  Insight and Judgment appropriate.     Verbal consent to treat:  Wound cleansed, crust removed with hydrogen peroxide, scant amount of drainage noted.  Granulation tissue revealed under.  Rinsed with saline.  antibiotic ointment applied and dry dressing.     , NP 12:10 PM [facility]  

## 2020-04-25 NOTE — Progress Notes (Signed)
Assessment and Plan:  Andrew Cochran was seen today for wound infection.  Diagnoses and all orders for this visit:  Left foot wound -Rx Doxycycline Discussed wound care, packing iodoform and saline Change daily, keep wound moist! Keep covered, WEAR SOCKS OR HOUSE SHOES TO KEEP AREA CLEAN DO NOT [place].  Essential hypertension Continue current medications: atenolol 100mg , losartan 50mg  Monitor blood pressure at home; call if consistently over 130/80 Continue DASH diet.   Reminder to go to the ER if any CP, SOB, nausea, dizziness, severe HA, changes vision/speech, left arm numbness and tingling and jaw pain.    Further disposition pending results of labs. Discussed med's effects and SE's.   Over 30 minutes of face to face interview exam, counseling, chart review, and critical decision making was performed.   Future Appointments  Date Time Provider [place]  06/17/2020 11:00 AM Liane Comber, NP [facility] None  09/16/2020 11:30 AM Unk Pinto, MD [facility] None  12/23/2020 11:00 AM Garnet Sierras, NP [facility] None  03/24/2021  2:00 PM Unk Pinto, MD [facility] None    ------------------------------------------------------------------------------------------------------------------   HPI 79 y.o.male presents for evaluation of right foot wound.  He reports he had treatment for this spot on 03/17/20.    From previous OV: After informed consent, the lesion of the Left medial foot as prepped aseptically with alcohol  & then infilkterated with 2 ml of Marcaine 0.5% intradermal & sub-cutaneous Then with a #10 scalpel, the lesion was excised by shave techique.  Then the exposed wound measuring ~ 2 cm x 2 cm was deeply hyfrecated. Then Derma-flex was used to seal the wound and control bleeding. Wound was covered with Abx ung and non- stick bandage and secured with "paper " tape. Patient was instructed in post-op care and advised to purchase wider shoes and  also to purchase arch supports.  Area treated on ankle last OV, improved essentially resolved.  He now has a spot to lateral left foot. He was cleansing area and changing bandage, ever other day.  Using mild soap and neosporin to the area.  He reports about 1.5 weeks ago he noticed it was getting increasingly red and sore.  He typically does not wear sock at home, walk around barefoot.  He wanted this to be looked at.    Past Medical History:  Diagnosis Date   BPH with elevated PSA    Cataracts, bilateral    Diverticulosis    Gout    Hx of adenomatous colonic polyps 06/18/2014   Hyperlipidemia    Hypertension    Prediabetes    Thyroid disease    Tubular adenoma 2/58/5277   Umbilical hernia 82/42/3536     Allergies  Allergen Reactions   Lotensin [Benazepril Hcl]     unknown   Penicillins Rash    Current Outpatient Medications on File Prior to Visit  Medication Sig   allopurinol (ZYLOPRIM) 300 MG tablet Take      1 tablet      Daily      to Prevent Gout   Ascorbic Acid (VITAMIN C PO) Take 2,000 mg by mouth daily.    atenolol (TENORMIN) 100 MG tablet Take     1 tablet     Daily      for BP   Blood Glucose Calibration (ACCU-CHEK SMARTVIEW CONTROL) LIQD Check blood sugar 1 time daily-DX-R73.03   Blood Glucose Monitoring Suppl (ACCU-CHEK AVIVA PLUS) w/Device KIT CHECK BLOOD SUGAR 1 TIME  DAILY   Cholecalciferol (VITAMIN D3) 125  MCG (5000 UT) CAPS Takes 1 capsule Daily   ezetimibe (ZETIA) 10 MG tablet TAKE 1 TABLET BY MOUTH  DAILY FOR CHOLESTEROL   finasteride (PROSCAR) 5 MG tablet Take 1 tablet Daily for Prostate   glucose blood (ACCU-CHEK AVIVA PLUS) test strip Check blood sugar 1 time a day-DX-R73.09   levothyroxine (SYNTHROID) 200 MCG tablet Take      1 tablet       Daily       on an empty stomach with only water for 30 minutes & no Antacid meds, Calcium or Magnesium for 4 hours & avoid Biotin   MAGNESIUM PO Take 250 mg by mouth 3 (three) times daily.     Microlet Lancets MISC Check blood sugar 1 time daily-DX-R73.09   Omega-3 Fatty Acids (FISH OIL PO) Take 1,000 mg by mouth daily.    predniSONE (DELTASONE) 10 MG tablet Take      1 tablet       every 2 to 3 days >>>>>> MUST LAST LONGER THAN 30  DAYS   QUEtiapine (SEROQUEL) 25 MG tablet Take 1/2 to 1 tablet 3 x /day for anxiety   losartan (COZAAR) 50 MG tablet Take 1 tablet daily for BP   rosuvastatin (CRESTOR) 40 MG tablet Take 1/2 to 1 tablet daily or as directed for Cholesterol   No current facility-administered medications on file prior to visit.    ROS: all negative except above.   Physical Exam:  BP 140/80    Pulse 63    Temp 97.7 F (36.5 C)    Ht $R'5\' 6"'kE$  (1.676 m)    Wt 204 lb (92.5 kg)    SpO2 95%    BMI 32.93 kg/m   General Appearance: Well nourished, in no apparent distress. Eyes: PERRLA, EOMs, conjunctiva no swelling or erythema Sinuses: No Frontal/maxillary tenderness ENT/Mouth: Ext aud canals clear, TMs without erythema, bulging. No erythema, swelling, or exudate on post pharynx.  Tonsils not swollen or erythematous. Hearing normal.  Neck: Supple, thyroid normal.  Respiratory: Respiratory effort normal, BS equal bilaterally without rales, rhonchi, wheezing or stridor.  Cardio: RRR with no MRGs. Brisk peripheral pulses without edema.  Abdomen: Soft, + BS.  Non tender, no guarding, rebound, hernias, masses. Lymphatics: Non tender without lymphadenopathy.  Musculoskeletal: Full ROM, 5/5 strength, normal gait.  Skin: Warm, dry without rashes, lesions, ecchymosis. 1cm x.0.5cm, decreased in size since last OV.  Erythema noted surrounding area, non fluctuant.  Raised scab is dark, dry firm.  Tender to touch. Neuro: Cranial nerves intact. Normal muscle tone, no cerebellar symptoms. Sensation intact.  Psych: Awake and oriented X 3, normal affect, Insight and Judgment appropriate.     Verbal consent to treat:  Wound cleansed, injected 1cc lidocaine to surrounding wound,  debridment of area losening, scant amount of drainage noted.  Granulation tissue revealed under.  Rinsed with saline, iodine to surrounding area.  Packed with iodoform, rinsed in saline, covered with dry dressing. Instructed to change daily, pack, cover with dry dressing.  Printed instructions provided and verbal teach back.    Garnet Sierras, Laqueta Jean, DNP [facility] 04/27/2020  10:42 AM

## 2020-04-27 ENCOUNTER — Ambulatory Visit (INDEPENDENT_AMBULATORY_CARE_PROVIDER_SITE_OTHER): Payer: Medicare Other | Admitting: Adult Health Nurse Practitioner

## 2020-04-27 ENCOUNTER — Encounter: Payer: Self-pay | Admitting: Adult Health Nurse Practitioner

## 2020-04-27 ENCOUNTER — Other Ambulatory Visit: Payer: Self-pay

## 2020-04-27 VITALS — BP 140/80 | HR 63 | Temp 97.7°F | Ht 66.0 in | Wt 204.0 lb

## 2020-04-27 DIAGNOSIS — S91301A Unspecified open wound, right foot, initial encounter: Secondary | ICD-10-CM | POA: Diagnosis not present

## 2020-04-27 DIAGNOSIS — I10 Essential (primary) hypertension: Secondary | ICD-10-CM

## 2020-04-27 MED ORDER — DOXYCYCLINE HYCLATE 100 MG PO CAPS: ORAL_CAPSULE | ORAL | 0 refills | Status: DC

## 2020-04-27 NOTE — Patient Instructions (Signed)
° °  Change bandage daily.  Use half inch of packing strip.  Purchase normal saline from any pharmacy.  Wet the strip and use clean cotton tip to fit inside wound.  You may use antibiotic ointment around the outside of the area and cover with a dry bandage.  Change this daily.  Start taking Doxycycline 100mg , twice a day for 10 days.   Please let us know if you have any new or worsening symptoms.

## 2020-04-30 ENCOUNTER — Telehealth: Payer: Self-pay

## 2020-04-30 NOTE — Telephone Encounter (Signed)
-----   Message from Garnet Sierras, NP sent at 04/27/2020  5:25 PM EST ----- Regarding: RE: med question Normal Saline ----- Message ----- From: Elenor Quinones, CMA Sent: 04/27/2020  12:43 PM EST To: Garnet Sierras, NP Subject: med question                                   What is the name of the [place] you suggested .

## 2020-04-30 NOTE — Telephone Encounter (Signed)
Unable to reach patient at this time, mailbox was full as well unable to leave message

## 2020-05-05 DIAGNOSIS — M79671 Pain in right foot: Secondary | ICD-10-CM | POA: Diagnosis not present

## 2020-05-05 DIAGNOSIS — M795 Residual foreign body in soft tissue: Secondary | ICD-10-CM | POA: Diagnosis not present

## 2020-05-05 DIAGNOSIS — M7989 Other specified soft tissue disorders: Secondary | ICD-10-CM | POA: Diagnosis not present

## 2020-05-05 DIAGNOSIS — L02611 Cutaneous abscess of right foot: Secondary | ICD-10-CM | POA: Diagnosis not present

## 2020-05-21 ENCOUNTER — Other Ambulatory Visit: Payer: Self-pay | Admitting: Internal Medicine

## 2020-05-21 DIAGNOSIS — M159 Polyosteoarthritis, unspecified: Secondary | ICD-10-CM

## 2020-05-26 ENCOUNTER — Other Ambulatory Visit: Payer: Self-pay | Admitting: Adult Health Nurse Practitioner

## 2020-05-26 DIAGNOSIS — M159 Polyosteoarthritis, unspecified: Secondary | ICD-10-CM

## 2020-05-26 DIAGNOSIS — M150 Primary generalized (osteo)arthritis: Secondary | ICD-10-CM

## 2020-06-04 DIAGNOSIS — L989 Disorder of the skin and subcutaneous tissue, unspecified: Secondary | ICD-10-CM | POA: Diagnosis not present

## 2020-06-08 DIAGNOSIS — L97512 Non-pressure chronic ulcer of other part of right foot with fat layer exposed: Secondary | ICD-10-CM | POA: Diagnosis not present

## 2020-06-08 DIAGNOSIS — M79671 Pain in right foot: Secondary | ICD-10-CM | POA: Diagnosis not present

## 2020-06-17 ENCOUNTER — Ambulatory Visit (INDEPENDENT_AMBULATORY_CARE_PROVIDER_SITE_OTHER): Payer: Medicare Other | Admitting: Adult Health

## 2020-06-17 ENCOUNTER — Other Ambulatory Visit: Payer: Self-pay

## 2020-06-17 ENCOUNTER — Encounter: Payer: Self-pay | Admitting: Adult Health

## 2020-06-17 VITALS — BP 124/84 | HR 86 | Temp 97.2°F | Wt 193.0 lb

## 2020-06-17 DIAGNOSIS — Z79899 Other long term (current) drug therapy: Secondary | ICD-10-CM

## 2020-06-17 DIAGNOSIS — K143 Hypertrophy of tongue papillae: Secondary | ICD-10-CM

## 2020-06-17 DIAGNOSIS — I700 Atherosclerosis of aorta: Secondary | ICD-10-CM

## 2020-06-17 DIAGNOSIS — M1000 Idiopathic gout, unspecified site: Secondary | ICD-10-CM

## 2020-06-17 DIAGNOSIS — E669 Obesity, unspecified: Secondary | ICD-10-CM

## 2020-06-17 DIAGNOSIS — I10 Essential (primary) hypertension: Secondary | ICD-10-CM | POA: Diagnosis not present

## 2020-06-17 DIAGNOSIS — E039 Hypothyroidism, unspecified: Secondary | ICD-10-CM | POA: Diagnosis not present

## 2020-06-17 DIAGNOSIS — E559 Vitamin D deficiency, unspecified: Secondary | ICD-10-CM | POA: Diagnosis not present

## 2020-06-17 DIAGNOSIS — E782 Mixed hyperlipidemia: Secondary | ICD-10-CM | POA: Diagnosis not present

## 2020-06-17 DIAGNOSIS — R7309 Other abnormal glucose: Secondary | ICD-10-CM

## 2020-06-17 MED ORDER — NYSTATIN 100000 UNIT/ML MT SUSP: OROMUCOSAL | 0 refills | Status: DC

## 2020-06-17 NOTE — Progress Notes (Signed)
3 MONTH FOLLOW UP Assessment:    Essential hypertension Continue current medications Monitor blood pressure at home; call if consistently over 130/80 Continue DASH diet.   Reminder to go to the ER if any CP, SOB, nausea, dizziness, severe HA, changes vision/speech, left arm numbness and tingling and jaw pain. -     CBC with Differential/Platelet -     COMPLETE METABOLIC PANEL WITH GFR  Hyperlipidemia, mixed Continue medications: zetia 10 mg daily  Has been working on lifstyle Discussed dietary and exercise modifications Low fat diet -     Lipid panel  Abnormal glucose Discussed dietary and exercise modifications  Vitamin D deficiency Continue supplementation to maintain goal of 60-100 Taking Vitamin D 5,000 IU daily  Idiopathic gout, unspecified chronicity, unspecified site Continue allopurinol 350m daily No recent flares Discussed dietary modifications Continue to monitor  Hypothyroidism, unspecified type Taking levothyroxine 200 mcg, takes at night Reminder to take on an empty stomach 30-659ms before first meal of the day. No antacid medications for 4 hours. -     TSH  Aortic atherosclerosis (HCC) Control blood pressure, lipids and glucose Disscused lifestyle modifications, diet & exercise Continue to monitor  BMI 30.0-30.9,adult Continue to recommend diet heavy in fruits and veggies and low in animal meats, cheeses, and dairy products, appropriate calorie intake Discuss exercise recommendations routinely Continue to monitor weight at each visit  Medication management Contrinued  Black hairy tongue Scrapes clear; poor oral hygiene; possible oral thrush Discussed scraping tongue and brushing 3 times a day,  Nystatin 5 cc swish and spit QID x 1-2 weeks; follow up if not resolving Reports has upcoming dental appointment -    Future Appointments  Date Time Provider [place]4/21/2022 11:30 AM McUnk PintoMD [facility] None  12/23/2020  11:00 AM McGarnet SierrasNP [facility] None  03/24/2021  2:00 PM McUnk PintoMD [facility] None      Subjective:  NoMAKI SWEETSERs a [age].o. male who presents for 3 month follow up for HTN, hyperlipidemia, prediabetes, and vitamin D Def.   He rpeorts recurrent fuzzy black growth on tongue for last year, will scrape off and resolve for 1 day but recurrent next AM. He reports brushing once a day. Denies oral pain. Denies tobacco use. Reports upcoming dental appointment -   He has history of nonfocal TIA in 2008, no sequela. States memory is unchanged, some short term memory issues. No incontinence.   His blood pressure has been controlled at home, today their BP is BP: 124/84 He does not workout. He denies chest pain, shortness of breath, dizziness.   BMI is Body mass index is 31.15 kg/m., he is working on diet and exercise, reports has reduced cookies.  Wt Readings from Last 3 Encounters:  06/17/20 193 lb (87.5 kg)  04/27/20 204 lb (92.5 kg)  04/07/20 202 lb (91.6 kg)   He is on cholesterol medication, he is on ezetimibe 10 mg daily, reports hasn't been taking crestor and denies myalgias. His cholesterol is not at goal. The cholesterol last visit was:   Lab Results  Component Value Date   CHOL 182 03/17/2020   HDL 47 03/17/2020   LDLCALC 105 (H) 03/17/2020   TRIG 182 (H) 03/17/2020   CHOLHDL 3.9 03/17/2020   Last A1C in the office was normal, did have A1C of 6.4 in 2011 but has decreased with exercise/diet:  Lab Results  Component Value Date   HGBA1C 4.8 03/17/2020   Patient is on Vitamin D  supplement.   Lab Results  Component Value Date   VD25OH 42 03/17/2020     He is on thyroid medication. His medication was not changed last visit. He is 1 tab (200 mcg) daily at night.  Patient denies nervousness, palpitations and weight changes.  Lab Results  Component Value Date   TSH 3.35 03/17/2020  .  He has 3 kids, 5 grand kids and 1 great grand kid.  Patient is on  allopurinol for gout and does not report a recent flare. Lab Results  Component Value Date   LABURIC 4.3 03/17/2020    Medication Review: Current Outpatient Medications on File Prior to Visit  Medication Sig Dispense Refill  . allopurinol (ZYLOPRIM) 300 MG tablet Take      1 tablet      Daily      to Prevent Gout 90 tablet 0  . Ascorbic Acid (VITAMIN C PO) Take 2,000 mg by mouth daily.     Marland Kitchen atenolol (TENORMIN) 100 MG tablet Take     1 tablet     Daily      for BP 90 tablet 0  . Blood Glucose Calibration (ACCU-CHEK SMARTVIEW CONTROL) LIQD Check blood sugar 1 time daily-DX-R73.03 1 each 3  . Blood Glucose Monitoring Suppl (ACCU-CHEK AVIVA PLUS) w/Device KIT CHECK BLOOD SUGAR 1 TIME  DAILY 1 kit 99  . Cholecalciferol (VITAMIN D3) 125 MCG (5000 UT) CAPS Takes 1 capsule Daily    . doxycycline (VIBRAMYCIN) 100 MG capsule Take 1 capsule twice daily with food 20 capsule 0  . ezetimibe (ZETIA) 10 MG tablet TAKE 1 TABLET BY MOUTH  DAILY FOR CHOLESTEROL 90 tablet 3  . finasteride (PROSCAR) 5 MG tablet Take 1 tablet Daily for Prostate 90 tablet 0  . glucose blood (ACCU-CHEK AVIVA PLUS) test strip Check blood sugar 1 time a day-DX-R73.09 100 each 3  . levothyroxine (SYNTHROID) 200 MCG tablet Take      1 tablet       Daily       on an empty stomach with only water for 30 minutes & no Antacid meds, Calcium or Magnesium for 4 hours & avoid Biotin 90 tablet 0  . MAGNESIUM PO Take 250 mg by mouth 3 (three) times daily.     . Microlet Lancets MISC Check blood sugar 1 time daily-DX-R73.09 100 each 3  . Omega-3 Fatty Acids (FISH OIL PO) Take 1,000 mg by mouth daily.     . predniSONE (DELTASONE) 10 MG tablet TAKE 1 TABLET BY MOUTH  EVERY 2 TO 3 DAYS. MUST  LAST LONGER THAN 30 DAYS 30 tablet 0  . QUEtiapine (SEROQUEL) 25 MG tablet Take 1/2 to 1 tablet 3 x /day for anxiety 90 tablet 0  . losartan (COZAAR) 50 MG tablet Take 1 tablet daily for BP 90 tablet 1  . rosuvastatin (CRESTOR) 40 MG tablet Take 1/2 to 1  tablet daily or as directed for Cholesterol 90 tablet 1   No current facility-administered medications on file prior to visit.    Current Problems (verified) Patient Active Problem List   Diagnosis Date Noted  . Aortic atherosclerosis ([place]) 04/10/2017  . Morbid obesity ([place]) 07/07/2014  . DJD  07/07/2014  . Polymyalgia rheumatica ([place]) 07/07/2014  . Hx of adenomatous colonic polyps 06/18/2014  . Other abnormal glucose 09/25/2013  . Vitamin D deficiency 09/25/2013  . Medication management 09/25/2013  . Cataracts, bilateral   . Gout   . Hypertension   . Hypothyroidism   .  BPH with elevated PSA   . Hyperlipidemia      Allergies Allergies  Allergen Reactions  . Lotensin [Benazepril Hcl]     unknown  . Penicillins Rash    SURGICAL HISTORY He  has a past surgical history that includes Cataract extraction (Right, 2009); Shoulder arthroscopy (Right); and Colonoscopy w/ biopsies. FAMILY HISTORY His family history includes COPD in his father; Cancer in his mother. SOCIAL HISTORY He  reports that he quit smoking about 61 years ago. He has never used smokeless tobacco. He reports that he does not drink alcohol and does not use drugs.  Review of Systems  Constitutional: Negative for malaise/fatigue and weight loss.  HENT: Negative for congestion, hearing loss, sore throat and tinnitus.        Black fuzzy tongue coating, persistent  Eyes: Negative for blurred vision and double vision.  Respiratory: Negative for cough, shortness of breath and wheezing.   Cardiovascular: Negative for chest pain, palpitations, orthopnea, claudication and leg swelling.  Gastrointestinal: Negative for abdominal pain, blood in stool, constipation, diarrhea, heartburn, melena, nausea and vomiting.  Genitourinary: Negative.   Musculoskeletal: Negative for joint pain and myalgias.  Skin: Negative for rash.  Neurological: Negative for dizziness, tingling, sensory change, weakness and headaches.   Endo/Heme/Allergies: Negative for polydipsia.  Psychiatric/Behavioral: Negative.   All other systems reviewed and are negative.    Objective:   Blood pressure 124/84, pulse 86, temperature (!) 97.2 F (36.2 C), weight 193 lb (87.5 kg), SpO2 100 %. Body mass index is 31.15 kg/m.  General appearance: alert, no distress, WD/WN, male HEENT: normocephalic, sclerae anicteric, TMs pearly, nares patent, no discharge or erythema, pharynx normal Oral cavity: MMM, no lesions; generalized post 2/3rds of tongue covered in black/dark green coating that scrapes clear with tongue blade; poor oral hygiene with missing teeth and tartar Neck: supple, no lymphadenopathy, no thyromegaly, no masses Heart: RRR, normal S1, S2, no murmurs Lungs: CTA bilaterally, no wheezes, rhonchi, or rales Abdomen: +bs, soft, non tender, non distended, no masses, no hepatomegaly, no splenomegaly Musculoskeletal: nontender, no swelling, no obvious deformity Extremities: no edema, no cyanosis, no clubbing Pulses: 2+ symmetric, upper and lower extremities, normal cap refill Neurological: alert, oriented x 3, CN2-12 intact, strength normal upper extremities and lower extremities, sensation normal throughout, DTRs 2+ throughout, no cerebellar signs, gait normal Psychiatric: normal affect, behavior normal, pleasant      Izora Ribas, NP   06/17/2020

## 2020-06-17 NOTE — Patient Instructions (Signed)
Wear socks and padding on side of foot to avoid rubbing   Scrape tongue and brush teeth well 3 times a day  Swish with nystatin 2-5 min then spit 4 times a day    Oral Thrush, Adult Oral thrush, also called oral candidiasis, is a fungal infection that develops in the mouth and throat and on the tongue. It causes white patches to form in the mouth and on the tongue. Many cases of thrush are mild, but this infection can also be serious. Ritta Slot can be a repeated (recurrent) problem for certain people who have a weak body defense system (immune system). The weakness can be caused by chronic illnesses, or by taking medicines that limit the body's ability to fight infection. If a person has difficulty fighting infection, the fungus that causes thrush can spread through the body. This can cause life-threatening blood or organ infections. What are the causes? This condition is caused by a fungus (yeast) called Candida albicans.  This fungus is normally present in small amounts in the mouth and on other mucous membranes. It usually causes no harm.  If conditions are present that allow the fungus to grow without control, it invades surrounding tissues and becomes an infection.  Other Candida species can also lead to thrush, though this is rare. What increases the risk? The following factors may make you more likely to develop this condition:  Having a weakened immune system.  Being an older adult.  Having diabetes, cancer, or HIV (human immunodeficiency virus).  Having dry mouth (xerostomia).  Being pregnant or breastfeeding.  Having poor dental care, especially in those who have dentures.  Using antibiotic or steroid medicines. What are the signs or symptoms? Symptoms of this condition can vary from mild and moderate to severe and persistent. Symptoms may include:  A burning feeling in the mouth and throat. This can occur at the start of a thrush infection.  White patches that stick  to the mouth and tongue. The tissue around the patches may be red, raw, and painful. If rubbed (during tooth brushing, for example), the patches and the tissue of the mouth may bleed easily.  A bad taste in the mouth or difficulty tasting foods.  A cottony feeling in the mouth.  Pain during eating and swallowing.  Poor appetite.  Cracking at the corners of the mouth.   How is this diagnosed? This condition is diagnosed based on:  A physical exam.  Your medical history. How is this treated? This condition is treated with medicines called antifungals, which prevent the growth of fungi. These medicines are either applied directly to the affected area (topical) or swallowed (oral). The treatment will depend on the severity of the condition.  Mild cases of thrush may be treated with an antifungal mouth rinse or lozenges. Treatment usually lasts about 14 days.  Moderate to severe cases of thrush can be treated with oral antifungal medicine, if they have spread to the esophagus. A topical antifungal medicine may also be used. For some severe infections, treatment may need to continue for more than 14 days. ? Oral antifungal medicines are rarely used during pregnancy because they may be harmful to the unborn child. If you are pregnant, talk with your health care provider about options for treatment.  Persistent or recurrent thrush. For cases of thrush that do not go away or keep coming back: ? Treatment may be needed twice as long as the symptoms last. ? Treatment will include both oral and topical antifungal  medicines. ? People with a weakened immune system can take an antifungal medicine on a continuous basis to prevent thrush infections. It is important to treat conditions that make a person more likely to get thrush, such as diabetes or HIV. Follow these instructions at home: Medicines  Take or use over-the-counter and prescription medicines only as told by your health care  provider.  Talk with your health care provider about an over-the-counter medicine called gentian violet, which kills bacteria and fungi. Relieving soreness and discomfort To help reduce the discomfort of thrush:  Drink cold liquids such as water or iced tea.  Try flavored ice treats or frozen juices.  Eat foods that are easy to swallow, such as gelatin, ice cream, or custard.  Try drinking from a straw if the patches in your mouth are painful.   General instructions  Eat plain, unflavored yogurt as directed by your health care provider. Check the label to make sure the yogurt contains live cultures. This yogurt can help healthy bacteria grow in the mouth and can stop the growth of the fungus that causes thrush.  If you wear dentures, remove the dentures before going to bed, brush them vigorously, and soak them in a cleaning solution as directed by your health care provider.  Rinse your mouth with a warm salt-water mixture several times a day. To make a salt-water mixture, dissolve -1 tsp (3-6 g) of salt in 1 cup (237 mL) of warm water. Contact a health care provider if:  Your symptoms are getting worse or are not improving within 7 days of starting treatment.  You have symptoms of a spreading infection, such as white patches on the skin outside of the mouth.  You are breastfeeding your baby and you have redness and pain in the nipples. Summary  Oral thrush, also called oral candidiasis, is a fungal infection that develops in the mouth and throat and on the tongue. It causes white patches to form in the mouth and on the tongue.  You are more likely to get this condition if you have a weakened immune system or an underlying condition, such as HIV, cancer, or diabetes.  This condition is treated with medicines called antifungals, which prevent the growth of fungi.  Contact a health care provider if your symptoms do not improve, or get worse, within 7 days of starting treatment. This  information is not intended to replace advice given to you by your health care provider. Make sure you discuss any questions you have with your health care provider. Document Revised: 03/21/2019 Document Reviewed: 03/21/2019 Elsevier Patient Education  2021 [place].     Nystatin oral suspension What is this medicine? NYSTATIN (nye STAT in) is an antifungal medicine. It is used to treat certain kinds of fungal or yeast infections. This medicine may be used for other purposes; ask your health care provider or pharmacist if you have questions. COMMON BRAND NAME(S): Mycostatin, Nystex What should I tell my health care provider before I take this medicine? They need to know if you have any of these conditions:  diabetes  kidney disease  an unusual or allergic reaction to nystatin, ethylenediamine, parabens, thimerosal, other foods, dyes or preservatives  pregnant or trying to get pregnant  breast-feeding How should I use this medicine? Follow the directions on the prescription label. Shake well before using. Use a specially marked dropper to measure every dose. Ask your pharmacist if you do not have one. Put one half of the dose in each  side of your mouth. Swish the medicine around in your mouth and gargle. Hold your dose in your mouth for as long as you can. Swallow or spit out as directed by your doctor. Take your medicine at regular intervals. Do not take your medicine more often than directed. Do not skip doses or stop your medicine early even if you feel better. Do not stop taking except on your doctor's advice. Talk to your pediatrician regarding the use of this medicine in children. Special care may be needed. Overdosage: If you think you have taken too much of this medicine contact a poison control center or emergency room at once. NOTE: This medicine is only for you. Do not share this medicine with others. What if I miss a dose? If you miss a dose, take it as soon as you can.  If it is almost time for your next dose, take only that dose. Do not take double or extra doses. What may interact with this medicine? Interactions are not expected. This list may not describe all possible interactions. Give your health care provider a list of all the medicines, herbs, non-prescription drugs, or dietary supplements you use. Also tell them if you smoke, drink alcohol, or use illegal drugs. Some items may interact with your medicine. What should I watch for while using this medicine? Tell your doctor or health care professional if your symptoms do not improve or get worse. If you wear dentures talk to your doctor about how to clean them. What side effects may I notice from receiving this medicine? Side effects that you should report to your doctor or health care professional as soon as possible:  allergic reactions like skin rash, itching or hives, swelling of the face, lips, or tongue  fast heart beat  redness, blistering, peeling or loosening of the skin, including inside the mouth  trouble breathing Side effects that usually do not require medical attention (report to your doctor or health care professional if they continue or are bothersome):  diarrhea  muscle aches or pains  nausea, vomiting  stomach upset This list may not describe all possible side effects. Call your doctor for medical advice about side effects. You may report side effects to FDA at 1-800-FDA-1088. Where should I keep my medicine? Keep out of the reach of children. Store at room temperature between 15 and 25 degrees C (59 and 77 degrees F). Protect from light. Throw away any unused medicine after the expiration date. NOTE: This sheet is a summary. It may not cover all possible information. If you have questions about this medicine, talk to your doctor, pharmacist, or health care provider.  2021 Elsevier/Gold Standard (2008-06-16 13:21:00)

## 2020-06-18 ENCOUNTER — Other Ambulatory Visit: Payer: Self-pay | Admitting: Adult Health

## 2020-06-18 DIAGNOSIS — E039 Hypothyroidism, unspecified: Secondary | ICD-10-CM

## 2020-06-18 LAB — COMPLETE METABOLIC PANEL WITH GFR
AG Ratio: 1.7 (calc) (ref 1.0–2.5)
ALT: 30 U/L (ref 9–46)
AST: 31 U/L (ref 10–35)
Albumin: 4.1 g/dL (ref 3.6–5.1)
Alkaline phosphatase (APISO): 88 U/L (ref 35–144)
BUN: 16 mg/dL (ref 7–25)
CO2: 30 mmol/L (ref 20–32)
Calcium: 9.5 mg/dL (ref 8.6–10.3)
Chloride: 108 mmol/L (ref 98–110)
Creat: 0.89 mg/dL (ref 0.70–1.18)
GFR, Est African American: 96 mL/min/{1.73_m2} (ref >=60)
GFR, Est Non African American: 82 mL/min/{1.73_m2} (ref >=60)
Globulin: 2.4 g/dL (calc) (ref 1.9–3.7)
Glucose, Bld: 97 mg/dL (ref 65–99)
Potassium: 4.4 mmol/L (ref 3.5–5.3)
Sodium: 144 mmol/L (ref 135–146)
Total Bilirubin: 0.9 mg/dL (ref 0.2–1.2)
Total Protein: 6.5 g/dL (ref 6.1–8.1)

## 2020-06-18 LAB — CBC WITH DIFFERENTIAL/PLATELET
Absolute Monocytes: 514 cells/uL (ref 200–950)
Basophils Absolute: 33 cells/uL (ref 0–200)
Basophils Relative: 0.5 %
Eosinophils Absolute: 59 cells/uL (ref 15–500)
Eosinophils Relative: 0.9 %
HCT: 42.7 % (ref 38.5–50.0)
Hemoglobin: 14.9 g/dL (ref 13.2–17.1)
Lymphs Abs: 1170 cells/uL (ref 850–3900)
MCH: 31 pg (ref 27.0–33.0)
MCHC: 34.9 g/dL (ref 32.0–36.0)
MCV: 89 fL (ref 80.0–100.0)
MPV: 10.6 fL (ref 7.5–12.5)
Monocytes Relative: 7.9 %
Neutro Abs: 4726 cells/uL (ref 1500–7800)
Neutrophils Relative %: 72.7 %
Platelets: 216 10*3/uL (ref 140–400)
RBC: 4.8 10*6/uL (ref 4.20–5.80)
RDW: 12.4 % (ref 11.0–15.0)
Total Lymphocyte: 18 %
WBC: 6.5 10*3/uL (ref 3.8–10.8)

## 2020-06-18 LAB — LIPID PANEL
Cholesterol: 181 mg/dL (ref <200)
HDL: 39 mg/dL — ABNORMAL LOW (ref >=40)
LDL Cholesterol (Calc): 122 mg/dL (calc) — ABNORMAL HIGH
Non-HDL Cholesterol (Calc): 142 mg/dL (calc) — ABNORMAL HIGH (ref <130)
Total CHOL/HDL Ratio: 4.6 (calc) (ref <5.0)
Triglycerides: 102 mg/dL (ref <150)

## 2020-06-18 LAB — TSH: TSH: 0.03 mIU/L — ABNORMAL LOW (ref 0.40–4.50)

## 2020-06-18 LAB — MAGNESIUM: Magnesium: 1.9 mg/dL (ref 1.5–2.5)

## 2020-06-18 MED ORDER — LEVOTHYROXINE SODIUM 200 MCG PO TABS: ORAL_TABLET | ORAL | 0 refills | Status: DC

## 2020-06-18 MED ORDER — ROSUVASTATIN CALCIUM 5 MG PO TABS: ORAL_TABLET | ORAL | 3 refills | Status: DC

## 2020-06-24 ENCOUNTER — Telehealth: Payer: Self-pay

## 2020-06-24 NOTE — Telephone Encounter (Signed)
Spoke with Vaughan Basta. States that Mr. Beagley has had Diarrhea for about two weeks. She gave him Pepto Bismol the last two nights and today seems to be better. Patient was just seen in the office with no mention of having diarrhea. Advised patient to stay hydrated, drinking Gatorade, Powerade and/or Pediatlyte, etc. If this persists, with no changes patient will need to be seen. Vaughan Basta voiced understanding.

## 2020-07-05 ENCOUNTER — Encounter: Payer: Self-pay | Admitting: Adult Health Nurse Practitioner

## 2020-07-05 ENCOUNTER — Ambulatory Visit (INDEPENDENT_AMBULATORY_CARE_PROVIDER_SITE_OTHER): Payer: Medicare HMO | Admitting: Adult Health Nurse Practitioner

## 2020-07-05 ENCOUNTER — Other Ambulatory Visit: Payer: Self-pay

## 2020-07-05 VITALS — BP 126/84 | HR 61 | Temp 97.5°F | Wt 193.0 lb

## 2020-07-05 DIAGNOSIS — R197 Diarrhea, unspecified: Secondary | ICD-10-CM

## 2020-07-05 DIAGNOSIS — K143 Hypertrophy of tongue papillae: Secondary | ICD-10-CM

## 2020-07-05 DIAGNOSIS — S91301A Unspecified open wound, right foot, initial encounter: Secondary | ICD-10-CM

## 2020-07-05 DIAGNOSIS — B370 Candidal stomatitis: Secondary | ICD-10-CM | POA: Diagnosis not present

## 2020-07-05 MED ORDER — NYSTATIN 100000 UNIT/ML MT SUSP
OROMUCOSAL | 0 refills | Status: DC
Start: 2020-07-05 — End: 2021-06-27

## 2020-07-05 MED ORDER — FLUCONAZOLE 100 MG PO TABS: 100.0000 mg | ORAL_TABLET | Freq: Every day | ORAL | 0 refills | Status: AC

## 2020-07-05 NOTE — Patient Instructions (Signed)
   Stop taking the magnesium supplements at this time.  This can be adding to the diarrhea.  You can continue eating yogurt as well.  Once diarrhea resolves you can slowly add back magnesium, one a day.  Please let us know if the diarrhea does not resolve.  We will send in a refill of the nystatin mouth rinse for you.  We will also send in a tablet for you to take.   For your foot, follow up with podiatry.  Soak your foot in epsom salts 1-2 times a day 15-63min.

## 2020-07-05 NOTE — Progress Notes (Signed)
ACUTE DIARRHEA     Assessment and Plan:  Haven was seen today for diarrhea.  Diagnoses and all orders for this visit:  Oral candida Black hairy tonguesis -     fluconazole (DIFLUCAN) 100 MG tablet; Take 1 tablet (100 mg total) by mouth daily. -     nystatin (MYCOSTATIN) 100000 UNIT/ML suspension; 5 ml four times a day, retain in mouth as long as possible (Swish and Swallow).  Use for 48 hours after symptoms resolve.   Wound of right foot Follow up with podiatry  Diarrhea, unspecified type From Magnesium? STOP magnesium at this time Contact office if continues    Further disposition pending results of labs. Discussed med's effects and SE's.   Over 30 minutes of face to face interview, exam, counseling, chart review, and critical decision making was performed.   Future Appointments  Date Time Provider [place]  07/21/2020 11:00 AM [facility] NURSE [facility] None  09/16/2020 11:30 AM Unk Pinto, MD [facility] None  12/23/2020 11:00 AM Garnet Sierras, NP [facility] None  03/24/2021  2:00 PM Unk Pinto, MD [facility] None    ------------------------------------------------------------------------------------------------------------------   HPI 80 y.o.male presents for evaluation of diarrhea.  He saw podiatrist in [place] for the wound in his foot, prescribed and cephalexin $RemoveBefore'500mg'GjHpcERjZNPUP$  and it was for 5 days.  He did not follow up after this.  Reports it is still painful and slightly red.  Denies any drainage from the area.  He has not changed his footwear, which was discussed last OV.  He is having 2-4 episodes a day.  Prior he was going once a day.  No cramping or bloating, nausea vomiting noted.  He is taking supplements including magnesium 2-3 times a day.  He is not sure why he is taking this.  He denies any malodors BM's.    Past Medical History:  Diagnosis Date  . BPH with elevated PSA   . Cataracts, bilateral   . Diverticulosis   . Gout   . Hx  of adenomatous colonic polyps 06/18/2014  . Hyperlipidemia   . Hypertension   . Prediabetes   . Thyroid disease   . Tubular adenoma 02/17/2003  . Umbilical hernia 16/02/9603     Allergies  Allergen Reactions  . Lotensin [Benazepril Hcl]     unknown  . Penicillins Rash    Current Outpatient Medications on File Prior to Visit  Medication Sig  . allopurinol (ZYLOPRIM) 300 MG tablet Take      1 tablet      Daily      to Prevent Gout  . Ascorbic Acid (VITAMIN C PO) Take 2,000 mg by mouth daily.   Marland Kitchen atenolol (TENORMIN) 100 MG tablet Take     1 tablet     Daily      for BP  . Blood Glucose Calibration (ACCU-CHEK SMARTVIEW CONTROL) LIQD Check blood sugar 1 time daily-DX-R73.03  . Blood Glucose Monitoring Suppl (ACCU-CHEK AVIVA PLUS) w/Device KIT CHECK BLOOD SUGAR 1 TIME  DAILY  . Cholecalciferol (VITAMIN D3) 125 MCG (5000 UT) CAPS Takes 1 capsule Daily  . ezetimibe (ZETIA) 10 MG tablet TAKE 1 TABLET BY MOUTH  DAILY FOR CHOLESTEROL  . glucose blood (ACCU-CHEK AVIVA PLUS) test strip Check blood sugar 1 time a day-DX-R73.09  . levothyroxine (SYNTHROID) 200 MCG tablet Take 1 tablet Daily except take 1/2 tab on Saturday; take on an empty stomach with only water for 30 minutes & no Antacid meds, Calcium or Magnesium for 4 hours & avoid  Biotin  . MAGNESIUM PO Take 250 mg by mouth 3 (three) times daily.   . Microlet Lancets MISC Check blood sugar 1 time daily-DX-R73.09  . Omega-3 Fatty Acids (FISH OIL PO) Take 1,000 mg by mouth daily.   . predniSONE (DELTASONE) 10 MG tablet TAKE 1 TABLET BY MOUTH  EVERY 2 TO 3 DAYS. MUST  LAST LONGER THAN 30 DAYS  . rosuvastatin (CRESTOR) 5 MG tablet Take 1 tab daily in the evening for cholesterol.   No current facility-administered medications on file prior to visit.    ROS: all negative except above.   Physical Exam:  BP 126/84   Pulse 61   Temp (!) 97.5 F (36.4 C)   Wt 193 lb (87.5 kg)   SpO2 99%   BMI 31.15 kg/m   General Appearance: Well  nourished, in no apparent distress. Eyes: PERRLA, EOMs, conjunctiva no swelling or erythema Sinuses: No Frontal/maxillary tenderness ENT/Mouth: Ext aud canals clear, TMs without erythema, bulging. No erythema, swelling, or exudate on post pharynx.  Tonsils not swollen or erythematous. Hearing normal.  Neck: Supple, thyroid normal.  Respiratory: Respiratory effort normal, BS equal bilaterally without rales, rhonchi, wheezing or stridor.  Cardio: RRR with no MRGs. Brisk peripheral pulses without edema.  Abdomen: Soft, + BS.  Non tender, no guarding, rebound, hernias, masses. Lymphatics: Non tender without lymphadenopathy.  Musculoskeletal: Full ROM, 5/5 strength, normal gait.  Skin: Warm, dry without rashes, lesions, ecchymosis.  Neuro: Cranial nerves intact. Normal muscle tone, no cerebellar symptoms. Sensation intact.  Psych: Awake and oriented X 3, normal affect, Insight and Judgment appropriate.      Follow up with podiatry, discussed this at length with patient.   Garnet Sierras, Laqueta Jean, DNP [facility] 07/05/2020  11:52 AM

## 2020-07-08 ENCOUNTER — Other Ambulatory Visit: Payer: Self-pay | Admitting: Internal Medicine

## 2020-07-08 DIAGNOSIS — I10 Essential (primary) hypertension: Secondary | ICD-10-CM

## 2020-07-08 DIAGNOSIS — M1000 Idiopathic gout, unspecified site: Secondary | ICD-10-CM

## 2020-07-08 DIAGNOSIS — E039 Hypothyroidism, unspecified: Secondary | ICD-10-CM

## 2020-07-14 DIAGNOSIS — E039 Hypothyroidism, unspecified: Secondary | ICD-10-CM | POA: Diagnosis not present

## 2020-07-14 DIAGNOSIS — I10 Essential (primary) hypertension: Secondary | ICD-10-CM | POA: Diagnosis not present

## 2020-07-14 DIAGNOSIS — E875 Hyperkalemia: Secondary | ICD-10-CM | POA: Diagnosis not present

## 2020-07-14 DIAGNOSIS — M353 Polymyalgia rheumatica: Secondary | ICD-10-CM | POA: Diagnosis not present

## 2020-07-14 DIAGNOSIS — L989 Disorder of the skin and subcutaneous tissue, unspecified: Secondary | ICD-10-CM | POA: Diagnosis not present

## 2020-07-14 DIAGNOSIS — I700 Atherosclerosis of aorta: Secondary | ICD-10-CM | POA: Diagnosis not present

## 2020-07-19 ENCOUNTER — Other Ambulatory Visit: Payer: Self-pay | Admitting: Internal Medicine

## 2020-07-21 ENCOUNTER — Ambulatory Visit: Payer: Medicare Other

## 2020-08-09 ENCOUNTER — Other Ambulatory Visit: Payer: Self-pay | Admitting: Internal Medicine

## 2020-08-09 DIAGNOSIS — M1000 Idiopathic gout, unspecified site: Secondary | ICD-10-CM

## 2020-08-09 DIAGNOSIS — M8949 Other hypertrophic osteoarthropathy, multiple sites: Secondary | ICD-10-CM

## 2020-08-09 DIAGNOSIS — M159 Polyosteoarthritis, unspecified: Secondary | ICD-10-CM

## 2020-09-16 ENCOUNTER — Ambulatory Visit: Payer: Medicare Other | Admitting: Internal Medicine

## 2020-09-16 ENCOUNTER — Encounter: Payer: Self-pay | Admitting: Internal Medicine

## 2020-09-16 NOTE — Progress Notes (Signed)
     C  A  N  C  E  L  L  E  D   "House      Flooded"                                                                                                                                                                                      This very nice 80 y.o.male presents for 3 month follow up with HTN, HLD, Pre-Diabetes and Vitamin D Deficiency. In 2012 Abd U/s found Aortic Atherosclerosis.       Patient is treated for HTN & BP has been controlled at home. Today's  . Patient has had no complaints of any cardiac type chest pain, palpitations, dyspnea / orthopnea / PND, dizziness, claudication, or dependent edema.       Hyperlipidemia is controlled with diet & meds. Patient denies myalgias or other med SE's. Last Lipids were  Lab Results  Component Value Date   CHOL 181 06/17/2020   HDL 39 (L) 06/17/2020   LDLCALC 122 (H) 06/17/2020   TRIG 102 06/17/2020   CHOLHDL 4.6 06/17/2020     Also, the patient has history of T2_NIDDM PreDiabetes and has had no symptoms of reactive hypoglycemia, diabetic polys, paresthesias or visual blurring.  Last A1c was   Lab Results  Component Value Date   HGBA1C 4.8 03/17/2020        Further, the patient also has history of Vitamin D Deficiency and supplements vitamin D without any suspected side-effects. Last vitamin D was  Lab Results  Component Value Date   VD25OH 71 03/17/2020

## 2020-09-23 ENCOUNTER — Other Ambulatory Visit: Payer: Self-pay | Admitting: Internal Medicine

## 2020-10-27 ENCOUNTER — Other Ambulatory Visit: Payer: Self-pay | Admitting: Adult Health Nurse Practitioner

## 2020-10-27 DIAGNOSIS — M8949 Other hypertrophic osteoarthropathy, multiple sites: Secondary | ICD-10-CM

## 2020-10-27 DIAGNOSIS — M159 Polyosteoarthritis, unspecified: Secondary | ICD-10-CM

## 2020-11-05 ENCOUNTER — Telehealth: Payer: Self-pay

## 2020-11-05 NOTE — Telephone Encounter (Signed)
Pt was called per Doctor Melford Aase to inform pt he needs to do a OV to discuss why he would like a medication increase and have lab work done. Pt stated he will call when he figure out when he can come in.

## 2020-11-22 ENCOUNTER — Other Ambulatory Visit: Payer: Self-pay | Admitting: Internal Medicine

## 2020-11-22 MED ORDER — DEXAMETHASONE 4 MG PO TABS: ORAL_TABLET | ORAL | 0 refills | Status: DC

## 2020-12-22 NOTE — Progress Notes (Signed)
MEDICARE ANNUAL WELLNESS VISIT AND FOLLOW UP Assessment:   Andrew Cochran was seen today for follow-up and medicare wellness.  Diagnoses and all orders for this visit:  Encounter for Medicare annual wellness exam Yearly  Essential hypertension Continue current medications:  Atenolol 100mg  at bedtime  Monitor blood pressure at home; call if consistently over 130/80 Continue DASH diet.   Reminder to go to the ER if any CP, SOB, nausea, dizziness, severe HA, changes vision/speech, left arm numbness and tingling and jaw pain. -     CBC with Differential/Platelet -     COMPLETE METABOLIC PANEL WITH GFR  Hyperlipidemia, mixed Continue medications: Rosuvastatin 5mg  daily Discussed dietary and exercise modifications Low fat diet -     Lipid panel  Abnormal glucose Discussed dietary and exercise modifications  Vitamin D deficiency Continue supplementation to maintain goal of 60-100 Taking Vitamin D 5,000 IU daily  Idiopathic gout, unspecified chronicity, unspecified site Continue allopurinol 300mg  daily No recent flares Discussed dietary modifications Continue to monitor  Hypothyroidism, unspecified type Taking levothyroxine 200 mcg, takes at night Reminder to take on an empty stomach 30-45mins before first meal of the day. No antacid medications for 4 hours. -     TSH  BPH with obstruction/lower urinary tract symptoms, elevated PSA Doing well at this time Will continue to monitor Defer PSA this check  Polymyalgia rheumatica ([place]) Doing well at this time Does use Dexamethasone 10 mg 3 days a weeks as needed, stressed importance of not using steroid more than 3 days a week Continue to monitor  Aortic atherosclerosis (HCC) Control blood pressure, lipids and glucose Disscused lifestyle modifications, diet & exercise Continue to monitor  Morbid obesity (HCC) Discussed dietary and exercise modifications Strongly encouraged exercise daily of 20-30 minutes  Medication  management Contrinued  Iron deficiency anemia, unspecified iron deficiency anemia type Not taking supplements Increase iron in diet   Callus of foot Pt to wear protective cushion around callus area on lateral side of right foot.  Stressed importance of well fitting shoes.   Further disposition pending results of labs. Discussed med's effects and SE's.   Over 30 minutes of exam, counseling, chart review, and critical decision making was performed.   Future Appointments  Date Time Provider [place]  03/24/2021  2:00 PM Unk Pinto, MD [facility] None  12/23/2021 11:30 AM Liane Comber, NP [facility] None     Plan:   During the course of the visit the patient was educated and counseled about appropriate screening and preventive services including:   Pneumococcal vaccine  Influenza vaccine Td vaccine Screening electrocardiogram Colorectal cancer screening Diabetes screening Glaucoma screening Nutrition counseling    Subjective:  Andrew Cochran is a 80 y.o. male who presents for Medicare Annual Wellness Visit and 3 month follow up for HTN, hyperlipidemia, prediabetes, and vitamin D Def.   He has history of nonfocal TIA in 2008, no sequela. States memory is unchanged, some short term memory issues. No incontinence.   His blood pressure has been controlled at home, today their BP is BP: (!) 164/87 BP Readings from Last 3 Encounters:  12/23/20 (!) 164/87  07/05/20 126/84  06/17/20 124/84    He does not workout. He denies chest pain, shortness of breath, dizziness.   BMI is Body mass index is 32.51 kg/m., he has not been working on diet and exercise. Wt Readings from Last 3 Encounters:  12/23/20 201 lb 6.4 oz (91.4 kg)  07/05/20 193 lb (87.5 kg)  06/17/20 193 lb (87.5  kg)   He states his appetite has been good. BMI is obese. Denies any night sweats. AB [place] 2012 showed fatty liver. He had colonoscopy 2019. no diarrhea/constipation. No trouble swallowing.    Pt has noticed an area on side of right foot which he states is tender to the touch .  Has not opened or drained.  Has been present for several weeks.    He has history of PMR x 2011, has been tapered off steroids, HOWEVER he would like to have have prednisone on hand that he would take AS needed for gout or PMR.Taking steroids a maximum of 3 days a week.    He is on cholesterol medication, he is on crestor $RemoveBe'5mg'eMAfURAmm$  daily, he is not on zetia and denies myalgias. His cholesterol is not at goal. The cholesterol last visit was:   Lab Results  Component Value Date   CHOL 181 06/17/2020   HDL 39 (L) 06/17/2020   LDLCALC 122 (H) 06/17/2020   TRIG 102 06/17/2020   CHOLHDL 4.6 06/17/2020   Last A1C in the office was normal, did have A1C of 6.4 in 2011 but has decreased with exercise/diet:  Lab Results  Component Value Date   HGBA1C 4.8 03/17/2020   Patient is on Vitamin D supplement.   Lab Results  Component Value Date   VD25OH 76 03/17/2020     He is on thyroid medication. His medication was changed last visit. He is on 200 mcg qd.  Patient denies nervousness, palpitations and weight changes.  Lab Results  Component Value Date   TSH 0.03 (L) 06/17/2020   Patient is on allopurinol for gout and does not report a recent flare. Lab Results  Component Value Date   LABURIC 4.3 03/17/2020    Medication Review: Current Outpatient Medications on File Prior to Visit  Medication Sig Dispense Refill   allopurinol (ZYLOPRIM) 300 MG tablet TAKE 1 TABLET EVERY DAY  TO  PREVENT  GOUT 90 tablet 3   Ascorbic Acid (VITAMIN C PO) Take 2,000 mg by mouth daily.      atenolol (TENORMIN) 100 MG tablet TAKE 1 TABLET BY MOUTH  DAILY FOR BLOOD PRESSURE 90 tablet 3   Beta Carotene (VITAMIN A) 25000 UNIT capsule Take 25,000 Units by mouth daily.     Blood Glucose Calibration (ACCU-CHEK SMARTVIEW CONTROL) LIQD Check blood sugar 1 time daily-DX-R73.03 1 each 3   Blood Glucose Monitoring Suppl (ACCU-CHEK AVIVA  PLUS) w/Device KIT CHECK BLOOD SUGAR 1 TIME  DAILY 1 kit 99   Cholecalciferol (VITAMIN D3) 125 MCG (5000 UT) CAPS Takes 1 capsule Daily     ezetimibe (ZETIA) 10 MG tablet TAKE 1 TABLET BY MOUTH  DAILY FOR CHOLESTEROL 90 tablet 3   Garlic 2637 MG TBEC Take by mouth.     glucose blood (ACCU-CHEK AVIVA PLUS) test strip Check blood sugar 1 time a day-DX-R73.09 100 each 3   levothyroxine (SYNTHROID) 200 MCG tablet TAKE 1 TAB DAILY ON AN EMPTY STOMACH WITH ONLY WATER FOR 30 MIN AND NO ANTACID MEDS, CALCIUM, MAGNESIUM FOR 4 HRS. AVOID BIOTIN SYNTHROID TAB 200MCG 90 tablet 3   MAGNESIUM PO Take 250 mg by mouth 3 (three) times daily.      Microlet Lancets MISC Check blood sugar 1 time daily-DX-R73.09 100 each 3   nystatin (MYCOSTATIN) 100000 UNIT/ML suspension 5 ml four times a day, retain in mouth as long as possible (Swish and Swallow).  Use for 48 hours after symptoms resolve. 80 mL 0  rosuvastatin (CRESTOR) 5 MG tablet Take 1 tab daily in the evening for cholesterol. 90 tablet 3   thiamine (VITAMIN B-1) 50 MG tablet Take 50 mg by mouth daily.     vitamin B-6 (PYRIDOXINE) 25 MG tablet Take 25 mg by mouth daily.     No current facility-administered medications on file prior to visit.    Current Problems (verified) Patient Active Problem List   Diagnosis Date Noted   Aortic atherosclerosis ([place]) by Abd U/S in 2012  04/10/2017   Obesity (BMI 30.0-34.9) 07/07/2014   DJD  07/07/2014   Polymyalgia rheumatica ([place]) 07/07/2014   Hx of adenomatous colonic polyps 06/18/2014   Other abnormal glucose 09/25/2013   Vitamin D deficiency 09/25/2013   Medication management 09/25/2013   Cataracts, bilateral    Gout    Hypertension    Hypothyroidism    BPH with elevated PSA    Hyperlipidemia      Immunization History  Administered Date(s) Administered   Influenza-Unspecified 01/27/2018   PFIZER(Purple Top)SARS-COV-2 Vaccination 07/04/2019, 07/29/2019   Pneumococcal Polysaccharide-23 08/19/2008   Td  09/07/2009    Preventative care: Last colonoscopy: 2019 Dr. Carlean Purl, + adenomas 3 years, due 04/2021 [place] AB 2012- had fatty liver   Prior vaccinations: TD or Tdap: 2011  Influenza: declines Pneumococcal: 12/23/20 Prevnar 13: declines Shingles/Zostavax: declines Covid : Pfizer 07/04/19, 07/29/19 No booster  Names of Other Physician/Practitioners you currently use: 1. [facility] here for primary care 2. Dr. Katy Fitch, eye doctor, Jan 2015, over due 3. [facility], dentist, 2022  Patient Care Team: Unk Pinto, MD as PCP - General (Internal Medicine) [place], [facility] Eduardo Osier., MD as Attending Physician (Urology) Gatha Mayer, MD as Consulting Physician (Gastroenterology) Clent Jacks, MD as Consulting Physician (Ophthalmology)  Allergies Allergies  Allergen Reactions   Lotensin [Benazepril Hcl]     unknown   Penicillins Rash    SURGICAL HISTORY He  has a past surgical history that includes Cataract extraction (Right, 2009); Shoulder arthroscopy (Right); and Colonoscopy w/ biopsies. FAMILY HISTORY His family history includes COPD in his father; Cancer in his mother. SOCIAL HISTORY He  reports that he quit smoking about 61 years ago. His smoking use included cigarettes. He has never used smokeless tobacco. He reports that he does not drink alcohol and does not use drugs.  MEDICARE WELLNESS OBJECTIVES: Physical activity: Current Exercise Habits: The patient does not participate in regular exercise at present, Exercise limited by: None identified Cardiac risk factors: Cardiac Risk Factors include: advanced age (>65men, >25 women);dyslipidemia;male gender;hypertension;obesity (BMI >30kg/m2);sedentary lifestyle Depression/mood screen:   Depression screen [facility] 2/9 12/23/2020  Decreased Interest 0  Down, Depressed, Hopeless 0  PHQ - 2 Score 0    ADLs:  In your present state of health, do you have any difficulty performing  the following activities: 12/23/2020  Hearing? N  Vision? N  Difficulty concentrating or making decisions? N  Walking or climbing stairs? N  Dressing or bathing? N  Doing errands, shopping? N  Some recent data might be hidden     Cognitive Testing  Alert? Yes  Normal Appearance?Yes  Oriented to person? Yes  Place? Yes   Time? Yes  Recall of three objects?  2/3  Can perform simple calculations? Yes  Displays appropriate judgment?Yes  Can read the correct time from a watch face?Yes  EOL planning: Does Patient Have a Medical Advance Directive?: No Would patient like information on creating a medical advance directive?: No -  Patient declined He does not have advanced directives, states wife will make the decision, if she passes will fill them out so 3 kids does not have to make choice  MMSE - Mini Mental State Exam 05/02/2018  Orientation to time 5  Orientation to Place 5  Registration 3  Attention/ Calculation 5  Recall 1  Language- name 2 objects 2  Language- repeat 1  Language- follow 3 step command 3  Language- read & follow direction 1  Write a sentence 1  Copy design 1  Total score 28   Review of Systems  Constitutional:  Negative for chills, fever and weight loss.  HENT:  Negative for congestion and hearing loss.   Eyes:  Negative for blurred vision and double vision.  Respiratory:  Negative for cough and shortness of breath.   Cardiovascular:  Negative for chest pain, palpitations, orthopnea and leg swelling.  Gastrointestinal:  Negative for abdominal pain, constipation, diarrhea, heartburn, nausea and vomiting.  Musculoskeletal:  Negative for falls, joint pain and myalgias.  Skin:  Negative for rash.       Tender area right side of foot  Neurological:  Negative for dizziness, tingling, tremors, loss of consciousness and headaches.  Psychiatric/Behavioral:  Negative for depression, memory loss and suicidal ideas.     Objective:   Blood pressure (!) 164/87, pulse  72, temperature (!) 97.3 F (36.3 C), weight 201 lb 6.4 oz (91.4 kg), SpO2 98 %. Body mass index is 32.51 kg/m.  General appearance: alert, no distress, WD/WN, male HEENT: normocephalic, sclerae anicteric, TMs pearly, nares patent, no discharge or erythema, pharynx normal Oral cavity: MMM, no lesions Neck: supple, no lymphadenopathy, no thyromegaly, no masses Heart: RRR, normal S1, S2, no murmurs Lungs: CTA bilaterally, no wheezes, rhonchi, or rales Abdomen: +bs, soft, non tender, non distended, no masses, no hepatomegaly, no splenomegaly Musculoskeletal: nontender, no swelling, no obvious deformity Extremities: no edema, no cyanosis, no clubbing Pulses: 2+ symmetric, upper and lower extremities, normal cap refill Neurological: alert, oriented x 3, CN2-12 intact, strength normal upper extremities and lower extremities, sensation normal throughout, DTRs 2+ throughout, no cerebellar signs, gait normal Skin: Warm and dry. Small callus noted on lateral side of right foot, no erythema or signs of infection Psychiatric: normal affect, behavior normal, pleasant      Medicare Attestation I have personally reviewed: The patient's medical and social history Their use of alcohol, tobacco or illicit drugs Their current medications and supplements The patient's functional ability including ADLs,fall risks, home safety risks, cognitive, and hearing and visual impairment Diet and physical activities Evidence for depression or mood disorders  The patient's weight, height, BMI, and visual acuity have been recorded in the chart.  I have made referrals, counseling, and provided education to the patient based on review of the above and I have provided the patient with a written personalized care plan for preventive services.    Magda Bernheim ANP-C  [facility]  12/23/2020

## 2020-12-23 ENCOUNTER — Ambulatory Visit: Payer: Medicare HMO | Admitting: Adult Health Nurse Practitioner

## 2020-12-23 ENCOUNTER — Ambulatory Visit (INDEPENDENT_AMBULATORY_CARE_PROVIDER_SITE_OTHER): Payer: Medicare Other | Admitting: Nurse Practitioner

## 2020-12-23 ENCOUNTER — Encounter: Payer: Self-pay | Admitting: Nurse Practitioner

## 2020-12-23 ENCOUNTER — Other Ambulatory Visit: Payer: Self-pay

## 2020-12-23 VITALS — BP 164/87 | HR 72 | Temp 97.3°F | Wt 201.4 lb

## 2020-12-23 DIAGNOSIS — M8949 Other hypertrophic osteoarthropathy, multiple sites: Secondary | ICD-10-CM

## 2020-12-23 DIAGNOSIS — M353 Polymyalgia rheumatica: Secondary | ICD-10-CM | POA: Diagnosis not present

## 2020-12-23 DIAGNOSIS — I10 Essential (primary) hypertension: Secondary | ICD-10-CM | POA: Diagnosis not present

## 2020-12-23 DIAGNOSIS — N401 Enlarged prostate with lower urinary tract symptoms: Secondary | ICD-10-CM

## 2020-12-23 DIAGNOSIS — R6889 Other general symptoms and signs: Secondary | ICD-10-CM | POA: Diagnosis not present

## 2020-12-23 DIAGNOSIS — M159 Polyosteoarthritis, unspecified: Secondary | ICD-10-CM

## 2020-12-23 DIAGNOSIS — Z23 Encounter for immunization: Secondary | ICD-10-CM | POA: Diagnosis not present

## 2020-12-23 DIAGNOSIS — M1000 Idiopathic gout, unspecified site: Secondary | ICD-10-CM | POA: Diagnosis not present

## 2020-12-23 DIAGNOSIS — N138 Other obstructive and reflux uropathy: Secondary | ICD-10-CM

## 2020-12-23 DIAGNOSIS — E039 Hypothyroidism, unspecified: Secondary | ICD-10-CM | POA: Diagnosis not present

## 2020-12-23 DIAGNOSIS — Z79899 Other long term (current) drug therapy: Secondary | ICD-10-CM | POA: Diagnosis not present

## 2020-12-23 DIAGNOSIS — E782 Mixed hyperlipidemia: Secondary | ICD-10-CM | POA: Diagnosis not present

## 2020-12-23 DIAGNOSIS — L84 Corns and callosities: Secondary | ICD-10-CM

## 2020-12-23 DIAGNOSIS — I700 Atherosclerosis of aorta: Secondary | ICD-10-CM | POA: Diagnosis not present

## 2020-12-23 DIAGNOSIS — Z0000 Encounter for general adult medical examination without abnormal findings: Secondary | ICD-10-CM

## 2020-12-23 DIAGNOSIS — D509 Iron deficiency anemia, unspecified: Secondary | ICD-10-CM

## 2020-12-23 DIAGNOSIS — E559 Vitamin D deficiency, unspecified: Secondary | ICD-10-CM | POA: Diagnosis not present

## 2020-12-23 DIAGNOSIS — Z0001 Encounter for general adult medical examination with abnormal findings: Secondary | ICD-10-CM

## 2020-12-23 DIAGNOSIS — R7309 Other abnormal glucose: Secondary | ICD-10-CM

## 2020-12-23 DIAGNOSIS — M150 Primary generalized (osteo)arthritis: Secondary | ICD-10-CM

## 2020-12-23 MED ORDER — PREDNISONE 10 MG PO TABS: ORAL_TABLET | ORAL | 0 refills | Status: DC

## 2020-12-23 NOTE — Patient Instructions (Signed)
Limit steroid use to three days a week at most.   Cerave ointment for dry skin that can be bought over the counter     [name]   Know what a healthy weight is for you (roughly BMI <25) and aim to maintain this   Aim for 7+ servings of fruits and vegetables daily   70-80+ fluid ounces of water or unsweet tea for healthy kidneys   Limit to max 1 drink of alcohol per day; avoid smoking/tobacco   Limit animal fats in diet for cholesterol and heart health - choose grass fed whenever available   Avoid highly processed foods, and foods high in saturated/trans fats   Aim for low stress - take time to unwind and care for your mental health   Aim for 150 min of moderate intensity exercise weekly for heart health, and weights twice weekly for bone health   Aim for 7-9 hours of sleep daily

## 2020-12-24 LAB — CBC WITH DIFFERENTIAL/PLATELET
Absolute Monocytes: 449 {cells}/uL (ref 200–950)
Basophils Absolute: 41 {cells}/uL (ref 0–200)
Basophils Relative: 0.8 %
Eosinophils Absolute: 199 {cells}/uL (ref 15–500)
Eosinophils Relative: 3.9 %
HCT: 41.8 % (ref 38.5–50.0)
Hemoglobin: 14.5 g/dL (ref 13.2–17.1)
Lymphs Abs: 1183 {cells}/uL (ref 850–3900)
MCH: 30.7 pg (ref 27.0–33.0)
MCHC: 34.7 g/dL (ref 32.0–36.0)
MCV: 88.4 fL (ref 80.0–100.0)
MPV: 10.6 fL (ref 7.5–12.5)
Monocytes Relative: 8.8 %
Neutro Abs: 3228 {cells}/uL (ref 1500–7800)
Neutrophils Relative %: 63.3 %
Platelets: 189 Thousand/uL (ref 140–400)
RBC: 4.73 Million/uL (ref 4.20–5.80)
RDW: 13.1 % (ref 11.0–15.0)
Total Lymphocyte: 23.2 %
WBC: 5.1 Thousand/uL (ref 3.8–10.8)

## 2020-12-24 LAB — COMPLETE METABOLIC PANEL WITH GFR
AG Ratio: 1.4 (calc) (ref 1.0–2.5)
ALT: 29 U/L (ref 9–46)
AST: 31 U/L (ref 10–35)
Albumin: 3.9 g/dL (ref 3.6–5.1)
Alkaline phosphatase (APISO): 100 U/L (ref 35–144)
BUN: 10 mg/dL (ref 7–25)
CO2: 31 mmol/L (ref 20–32)
Calcium: 9.2 mg/dL (ref 8.6–10.3)
Chloride: 104 mmol/L (ref 98–110)
Creat: 0.93 mg/dL (ref 0.70–1.28)
Globulin: 2.7 g/dL (calc) (ref 1.9–3.7)
Glucose, Bld: 95 mg/dL (ref 65–99)
Potassium: 4.3 mmol/L (ref 3.5–5.3)
Sodium: 142 mmol/L (ref 135–146)
Total Bilirubin: 1 mg/dL (ref 0.2–1.2)
Total Protein: 6.6 g/dL (ref 6.1–8.1)
eGFR: 84 mL/min/{1.73_m2} (ref >=60)

## 2020-12-24 LAB — LIPID PANEL
Cholesterol: 195 mg/dL (ref <200)
HDL: 45 mg/dL (ref >=40)
LDL Cholesterol (Calc): 130 mg/dL (calc) — ABNORMAL HIGH
Non-HDL Cholesterol (Calc): 150 mg/dL (calc) — ABNORMAL HIGH (ref <130)
Total CHOL/HDL Ratio: 4.3 (calc) (ref <5.0)
Triglycerides: 95 mg/dL (ref <150)

## 2020-12-24 LAB — TSH: TSH: 1.63 mIU/L (ref 0.40–4.50)

## 2020-12-24 LAB — HEMOGLOBIN A1C
Hgb A1c MFr Bld: 4.9 % of total Hgb (ref <5.7)
Mean Plasma Glucose: 94 mg/dL
eAG (mmol/L): 5.2 mmol/L

## 2020-12-24 LAB — MAGNESIUM: Magnesium: 1.8 mg/dL (ref 1.5–2.5)

## 2020-12-24 LAB — VITAMIN D 25 HYDROXY (VIT D DEFICIENCY, FRACTURES): Vit D, 25-Hydroxy: 96 ng/mL (ref 30–100)

## 2021-02-17 ENCOUNTER — Other Ambulatory Visit: Payer: Self-pay | Admitting: Nurse Practitioner

## 2021-02-17 DIAGNOSIS — M150 Primary generalized (osteo)arthritis: Secondary | ICD-10-CM

## 2021-02-17 DIAGNOSIS — M159 Polyosteoarthritis, unspecified: Secondary | ICD-10-CM

## 2021-02-17 DIAGNOSIS — M8949 Other hypertrophic osteoarthropathy, multiple sites: Secondary | ICD-10-CM

## 2021-02-22 ENCOUNTER — Other Ambulatory Visit: Payer: Self-pay | Admitting: Nurse Practitioner

## 2021-02-22 DIAGNOSIS — M8949 Other hypertrophic osteoarthropathy, multiple sites: Secondary | ICD-10-CM

## 2021-02-22 DIAGNOSIS — M159 Polyosteoarthritis, unspecified: Secondary | ICD-10-CM

## 2021-03-22 ENCOUNTER — Other Ambulatory Visit: Payer: Self-pay | Admitting: Nurse Practitioner

## 2021-03-22 DIAGNOSIS — M150 Primary generalized (osteo)arthritis: Secondary | ICD-10-CM

## 2021-03-22 DIAGNOSIS — M159 Polyosteoarthritis, unspecified: Secondary | ICD-10-CM

## 2021-03-24 ENCOUNTER — Encounter: Payer: Self-pay | Admitting: Internal Medicine

## 2021-03-24 ENCOUNTER — Other Ambulatory Visit: Payer: Self-pay

## 2021-03-24 ENCOUNTER — Ambulatory Visit (INDEPENDENT_AMBULATORY_CARE_PROVIDER_SITE_OTHER): Payer: Medicare Other | Admitting: Internal Medicine

## 2021-03-24 VITALS — BP 140/80 | HR 60 | Temp 97.6°F | Resp 16 | Ht 66.0 in | Wt 200.6 lb

## 2021-03-24 DIAGNOSIS — R7309 Other abnormal glucose: Secondary | ICD-10-CM | POA: Diagnosis not present

## 2021-03-24 DIAGNOSIS — E782 Mixed hyperlipidemia: Secondary | ICD-10-CM | POA: Diagnosis not present

## 2021-03-24 DIAGNOSIS — E559 Vitamin D deficiency, unspecified: Secondary | ICD-10-CM | POA: Diagnosis not present

## 2021-03-24 DIAGNOSIS — Z79899 Other long term (current) drug therapy: Secondary | ICD-10-CM

## 2021-03-24 DIAGNOSIS — E039 Hypothyroidism, unspecified: Secondary | ICD-10-CM

## 2021-03-24 DIAGNOSIS — N138 Other obstructive and reflux uropathy: Secondary | ICD-10-CM

## 2021-03-24 DIAGNOSIS — M1000 Idiopathic gout, unspecified site: Secondary | ICD-10-CM

## 2021-03-24 DIAGNOSIS — I700 Atherosclerosis of aorta: Secondary | ICD-10-CM

## 2021-03-24 DIAGNOSIS — Z0000 Encounter for general adult medical examination without abnormal findings: Secondary | ICD-10-CM | POA: Diagnosis not present

## 2021-03-24 DIAGNOSIS — Z125 Encounter for screening for malignant neoplasm of prostate: Secondary | ICD-10-CM

## 2021-03-24 DIAGNOSIS — Z136 Encounter for screening for cardiovascular disorders: Secondary | ICD-10-CM | POA: Diagnosis not present

## 2021-03-24 DIAGNOSIS — Z1211 Encounter for screening for malignant neoplasm of colon: Secondary | ICD-10-CM

## 2021-03-24 DIAGNOSIS — I10 Essential (primary) hypertension: Secondary | ICD-10-CM | POA: Diagnosis not present

## 2021-03-24 DIAGNOSIS — Z0001 Encounter for general adult medical examination with abnormal findings: Secondary | ICD-10-CM

## 2021-03-24 DIAGNOSIS — N401 Enlarged prostate with lower urinary tract symptoms: Secondary | ICD-10-CM

## 2021-03-24 NOTE — Patient Instructions (Signed)

## 2021-03-24 NOTE — Progress Notes (Signed)
Annual  Screening/Preventative Visit  & Comprehensive Evaluation & Examination  Future Appointments  Date Time Provider [place]  03/24/2021      CPE  2:00 PM Unk Pinto, MD [facility] None  12/23/2021       Wellness 11:30 AM Liane Comber, NP [facility] None  03/27/2022       CPE  2:00 PM Unk Pinto, MD [facility] None            This very nice 80 y.o. MWM presents for a Screening /Preventative Visit & comprehensive evaluation and management of multiple medical co-morbidities.  Patient has been followed for HTN, HLD, Prediabetes, Hypothyroidism  and Vitamin D Deficiency. Abd U/S  in 2012 demonstrated Aortic Atherosclerosis. Patient has hx/o chronic pain syndrome  from DJD managed by low dose steroids        HTN predates since 1997. Patient's BP has been controlled at home.  Today's BP is at goal  -  140/80. Patient denies any cardiac symptoms as chest pain, palpitations, shortness of breath, dizziness or ankle swelling.       Patient's hyperlipidemia is not controlled with diet and Rosuvastatin /Ezetimibe. Patient denies myalgias or other medication SE's. Last lipids were still not at goal :  Lab Results  Component Value Date   CHOL 195 12/23/2020   HDL 45 12/23/2020   LDLCALC 130 (H) 12/23/2020   TRIG 95 12/23/2020   CHOLHDL 4.3 12/23/2020         Patient has moderate Obesity  (BMI 32+) and hx/o prediabetes (A1c 6.4% /2011) and patient denies reactive hypoglycemic symptoms, visual blurring, diabetic polys or paresthesias. Last A1c was normal & at goal :   Lab Results  Component Value Date   HGBA1C 4.9 12/23/2020         Patient was dx'd Hypothyroid in 2002 & has been on thyroid replacement since.         Finally, patient has history of Vitamin D Deficiency ("36" /2008) and last vitamin D was at goal:   Lab Results  Component Value Date   VD25OH 96 12/23/2020     Current Outpatient Medications on File Prior to Visit  Medication Sig    allopurinol  300 MG tablet TAKE 1 TABLET EVERY DAY  TO  PREVENT  GOUT   VITAMIN C  Take 2,000 mg daily.    atenolol 100 MG tablet TAKE 1 TABLET  DAILY   Beta Carotene (VITAMIN A) 25000 UNIT capsule Take 25,000 Units  daily.   VITAMIN D 5000 u. Takes 1 capsule Daily   ezetimibe 10 MG tablet TAKE 1 TABLET  DAILY    Garlic 2956 MG TBEC Take daily   levothyroxine 200 MCG tablet TAKE 1 TAB DAILY    MAGNESIUM 250 mg  Take  3  times daily.    nystatin 100,000 UNIT/ML susp 5 ml four times a day   predniSONE 10 MG tablet TAKE 1 TABLET EVERY 2-3 DAYS, MUST LAST LONGER THAN 60 DAYS   rosuvastatin 5 MG tablet Take 1 tab daily in the evening for cholesterol.   thiamine (VITAMIN B-1) 50 MG tablet Take 50 mg by mouth daily.   vitamin B-6 (PYRIDOXINE) 25 MG tablet Take 25 mg by mouth daily.     Allergies  Allergen Reactions   Lotensin [Benazepril Hcl]     unknown   Penicillins Rash     Past Medical History:  Diagnosis Date   BPH with elevated PSA    Cataracts, bilateral  Diverticulosis    Gout    Hx of adenomatous colonic polyps 06/18/2014   Hyperlipidemia    Hypertension    Prediabetes    Thyroid disease    Tubular adenoma 1/61/0960   Umbilical hernia 45/40/9811    Health Maintenance  Topic Date Due   COVID-19 Vaccine (3 - Pfizer risk series) 08/26/2019   Zoster Vaccines- Shingrix (1 of 2) 03/25/2021 (Originally 08/22/1959)   TETANUS/TDAP  12/23/2021 (Originally 09/08/2019)   Hepatitis C Screening  12/23/2021 (Originally 08/22/1958)   COLONOSCOPY  05/16/2021   Pneumonia Vaccine 9+ Years old 12/23/2021   HPV VACCINES  Aged Out   INFLUENZA VACCINE  Discontinued    Immunization History  Administered Date(s) Administered   Influenza 01/27/2018   PFIZER SARS-COV-2 Vacc 07/04/2019, 07/29/2019   Pneumococcal -23 08/19/2008, 12/23/2020   Td 09/07/2009    Last Colon - 05/16/2018 - Dr Carlean Purl - Negative & Recc no F/U due to age   Past Surgical History:  Procedure Laterality Date    CATARACT EXTRACTION Right 2009   COLONOSCOPY W/ BIOPSIES     SHOULDER ARTHROSCOPY Right    1979     Family History  Problem Relation Age of Onset   Cancer Mother    COPD Father    Colon cancer Neg Hx    Esophageal cancer Neg Hx    Rectal cancer Neg Hx    Stomach cancer Neg Hx      Social History  Married - wife is Vaughan Basta - 3 children  Tobacco Use   Smoking status: Former    Types: Cigarettes    Quit date: 05/30/1959    Years since quitting: 61.8   Smokeless tobacco: Never  Vaping Use   Vaping Use: Never used  Substance Use Topics   Alcohol use: No   Drug use: No      ROS Constitutional: Denies fever, chills, weight loss/gain, headaches, insomnia,  night sweats or change in appetite. Does c/o fatigue. Eyes: Denies redness, blurred vision, diplopia, discharge, itchy or watery eyes.  ENT: Denies discharge, congestion, post nasal drip, epistaxis, sore throat, earache, hearing loss, dental pain, Tinnitus, Vertigo, Sinus pain or snoring.  Cardio: Denies chest pain, palpitations, irregular heartbeat, syncope, dyspnea, diaphoresis, orthopnea, PND, claudication or edema Respiratory: denies cough, dyspnea, DOE, pleurisy, hoarseness, laryngitis or wheezing.  Gastrointestinal: Denies dysphagia, heartburn, reflux, water brash, pain, cramps, nausea, vomiting, bloating, diarrhea, constipation, hematemesis, melena, hematochezia, jaundice or hemorrhoids Genitourinary: Denies dysuria, frequency, urgency, nocturia, hesitancy, discharge, hematuria or flank pain Musculoskeletal: Denies arthralgia, myalgia, stiffness, Jt. Swelling, pain, limp or strain/sprain. Denies Falls. Skin: Denies puritis, rash, hives, warts, acne, eczema or change in skin lesion Neuro: No weakness, tremor, incoordination, spasms, paresthesia or pain Psychiatric: Denies confusion, memory loss or sensory loss. Denies Depression. Endocrine: Denies change in weight, skin, hair change, nocturia, and paresthesia, diabetic  polys, visual blurring or hyper / hypo glycemic episodes.  Heme/Lymph: No excessive bleeding, bruising or enlarged lymph nodes.   Physical Exam  BP 140/80   Pulse 60   Temp 97.6 F (36.4 C)   Resp 16   Ht 5\' 6"  (1.676 m)   Wt 200 lb 9.6 oz (91 kg)   SpO2 97%   BMI 32.38 kg/m   General Appearance: Over nourished  and in no apparent distress.  Eyes: PERRLA, EOMs, conjunctiva no swelling or erythema, normal fundi and vessels. Sinuses: No frontal/maxillary tenderness ENT/Mouth: EACs patent / TMs  nl. Nares clear without erythema, swelling, mucoid exudates. Oral hygiene is good.  No erythema, swelling, or exudate. Tongue normal, non-obstructing. Tonsils not swollen or erythematous. Hearing normal.  Neck: Supple, thyroid not palpable. No bruits, nodes or JVD. Respiratory: Respiratory effort normal.  BS equal and clear bilateral without rales, rhonci, wheezing or stridor. Cardio: Heart sounds are normal with regular rate and rhythm and no murmurs, rubs or gallops. Peripheral pulses are normal and equal bilaterally without edema. No aortic or femoral bruits. Chest: symmetric with normal excursions and percussion.  Abdomen: Soft, with Nl bowel sounds. Nontender, no guarding, rebound, hernias, masses, or organomegaly.  Lymphatics: Non tender without lymphadenopathy.  Musculoskeletal: Full ROM all peripheral extremities, joint stability, 5/5 strength, and normal gait. Skin: Warm and dry without rashes, lesions, cyanosis, clubbing or  ecchymosis.  Neuro: Cranial nerves intact, reflexes equal bilaterally. Normal muscle tone, no cerebellar symptoms. Sensation intact.  Pysch: Alert and oriented x 3 with normal affect, insight and judgment appropriate.   Assessment and Plan  1. Annual Preventative/Screening Exam    2. Essential hypertension  - EKG 12-Lead - [place], RETROPERITNL ABD,  LTD - Urinalysis, Routine w reflex microscopic - Microalbumin / creatinine urine ratio - Magnesium - COMPLETE  METABOLIC PANEL WITH GFR - CBC with Differential/Platelet  3. Screening for colorectal cancer  - EKG 12-Lead - [place], RETROPERITNL ABD,  LTD - POC Hemoccult Bld/Stl   4. Hyperlipidemia, mixed  - EKG 12-Lead - [place], RETROPERITNL ABD,  LTD - Lipid panel  5. Abnormal glucose  - EKG 12-Lead - [place], RETROPERITNL ABD,  LTD - Insulin, random - Hemoglobin A1c  6. Vitamin D deficiency  - VITAMIN D 25 Hydroxy   7. Hypothyroidism  - TSH  8. Idiopathic gout  - Uric acid  9. Aortic atherosclerosis (HCC)  - EKG 12-Lead - [place], RETROPERITNL ABD,  LTD - Lipid panel  10. Morbid obesity (HCC)  - TSH  11. BPH with obstruction/lower urinary tract symptoms  - PSA  12. Prostate cancer screening  - PSA  13. Screening for heart disease  - EKG 12-Lead  14. Medication management  - Urinalysis, Routine w reflex microscopic - Microalbumin / creatinine urine ratio - Uric acid - VITAMIN D 25 Hydroxy  - Insulin, random - Hemoglobin A1c - TSH - Lipid panel - Magnesium - COMPLETE METABOLIC PANEL WITH GFR - CBC with Differential/Platelet  15. Screening for AAA (aortic abdominal aneurysm)  - [place], RETROPERITNL ABD,  LTD          Patient was counseled in prudent diet, weight control to achieve/maintain BMI less than 25, BP monitoring, regular exercise and medications as discussed.  Discussed med effects and SE's. Routine screening labs and tests as requested with regular follow-up as recommended. Over 40 minutes of exam, counseling, chart review and high complex critical decision making was performed   Kirtland Bouchard, MD

## 2021-03-25 ENCOUNTER — Other Ambulatory Visit: Payer: Self-pay | Admitting: Internal Medicine

## 2021-03-25 DIAGNOSIS — N3000 Acute cystitis without hematuria: Secondary | ICD-10-CM

## 2021-03-25 DIAGNOSIS — R972 Elevated prostate specific antigen [PSA]: Secondary | ICD-10-CM

## 2021-03-25 LAB — HEMOGLOBIN A1C
Hgb A1c MFr Bld: 4.7 % of total Hgb (ref <5.7)
Mean Plasma Glucose: 88 mg/dL
eAG (mmol/L): 4.9 mmol/L

## 2021-03-25 LAB — LIPID PANEL
Cholesterol: 181 mg/dL (ref <200)
HDL: 40 mg/dL (ref >=40)
LDL Cholesterol (Calc): 113 mg/dL (calc) — ABNORMAL HIGH
Non-HDL Cholesterol (Calc): 141 mg/dL (calc) — ABNORMAL HIGH (ref <130)
Total CHOL/HDL Ratio: 4.5 (calc) (ref <5.0)
Triglycerides: 163 mg/dL — ABNORMAL HIGH (ref <150)

## 2021-03-25 LAB — COMPLETE METABOLIC PANEL WITH GFR
AG Ratio: 1.4 (calc) (ref 1.0–2.5)
ALT: 25 U/L (ref 9–46)
AST: 28 U/L (ref 10–35)
Albumin: 3.9 g/dL (ref 3.6–5.1)
Alkaline phosphatase (APISO): 92 U/L (ref 35–144)
BUN: 13 mg/dL (ref 7–25)
CO2: 32 mmol/L (ref 20–32)
Calcium: 9.4 mg/dL (ref 8.6–10.3)
Chloride: 104 mmol/L (ref 98–110)
Creat: 0.82 mg/dL (ref 0.70–1.28)
Globulin: 2.8 g/dL (calc) (ref 1.9–3.7)
Glucose, Bld: 90 mg/dL (ref 65–99)
Potassium: 3.9 mmol/L (ref 3.5–5.3)
Sodium: 143 mmol/L (ref 135–146)
Total Bilirubin: 0.8 mg/dL (ref 0.2–1.2)
Total Protein: 6.7 g/dL (ref 6.1–8.1)
eGFR: 90 mL/min/{1.73_m2} (ref >=60)

## 2021-03-25 LAB — CBC WITH DIFFERENTIAL/PLATELET
Absolute Monocytes: 483 cells/uL (ref 200–950)
Basophils Absolute: 21 cells/uL (ref 0–200)
Basophils Relative: 0.3 %
Eosinophils Absolute: 203 cells/uL (ref 15–500)
Eosinophils Relative: 2.9 %
HCT: 41.7 % (ref 38.5–50.0)
Hemoglobin: 14.4 g/dL (ref 13.2–17.1)
Lymphs Abs: 1512 cells/uL (ref 850–3900)
MCH: 31.6 pg (ref 27.0–33.0)
MCHC: 34.5 g/dL (ref 32.0–36.0)
MCV: 91.6 fL (ref 80.0–100.0)
MPV: 10.5 fL (ref 7.5–12.5)
Monocytes Relative: 6.9 %
Neutro Abs: 4781 cells/uL (ref 1500–7800)
Neutrophils Relative %: 68.3 %
Platelets: 198 10*3/uL (ref 140–400)
RBC: 4.55 10*6/uL (ref 4.20–5.80)
RDW: 13.7 % (ref 11.0–15.0)
Total Lymphocyte: 21.6 %
WBC: 7 10*3/uL (ref 3.8–10.8)

## 2021-03-25 LAB — MICROALBUMIN / CREATININE URINE RATIO
Creatinine, Urine: 292 mg/dL (ref 20–320)
Microalb Creat Ratio: 280 mcg/mg creat — ABNORMAL HIGH (ref <30)
Microalb, Ur: 81.7 mg/dL

## 2021-03-25 LAB — URINALYSIS, ROUTINE W REFLEX MICROSCOPIC
Bilirubin Urine: NEGATIVE
Glucose, UA: NEGATIVE
Hgb urine dipstick: NEGATIVE
Hyaline Cast: NONE SEEN /LPF
Nitrite: NEGATIVE
Specific Gravity, Urine: 1.031 (ref 1.001–1.035)
Squamous Epithelial / HPF: NONE SEEN /HPF (ref <=5)
pH: 6 (ref 5.0–8.0)

## 2021-03-25 LAB — TSH: TSH: 1.84 mIU/L (ref 0.40–4.50)

## 2021-03-25 LAB — VITAMIN D 25 HYDROXY (VIT D DEFICIENCY, FRACTURES): Vit D, 25-Hydroxy: 91 ng/mL (ref 30–100)

## 2021-03-25 LAB — MAGNESIUM: Magnesium: 2.1 mg/dL (ref 1.5–2.5)

## 2021-03-25 LAB — PSA: PSA: 6.81 ng/mL — ABNORMAL HIGH (ref <=4.00)

## 2021-03-25 LAB — URIC ACID: Uric Acid, Serum: 4.2 mg/dL (ref 4.0–8.0)

## 2021-03-25 LAB — INSULIN, RANDOM: Insulin: 19.2 u[IU]/mL

## 2021-03-25 MED ORDER — CIPROFLOXACIN HCL 500 MG PO TABS: ORAL_TABLET | ORAL | 0 refills | Status: DC

## 2021-03-25 NOTE — Progress Notes (Signed)
============================================================ ============================================================  -    Friday - >> U/A very suspicious for UTI /prostatitis.   Urine culture requested & Rx Cipro  x 10 days   sent to [facility] ============================================================ ============================================================  -  PSA = 6.81  still elevated - Recommend urology consultation - Requested  ((  Urology referral was made on 02/19/2019, but no record of consultation  )) ============================================================ ============================================================  -  Uric Acid  / Gout test is Normal  - OK  - Please continue Allopurinol  ============================================================ ============================================================  -  Vitamin D =  91 - Excellent  - Please continue dose  same  ============================================================ ============================================================  -  A1c  - Normal  - No Diabetes  - Great  ! ============================================================ ============================================================  -   -  Total  Chol =    181             -   great              (  Ideal  or  Goal is less than 180  !  )   - and   -   But    Bad / Dangerous LDL  Chol =  113  -  Elevated              (  Ideal  or  Goal is less than 70  !  )   So  Recommend a stricter low cholesterol diet   - Cholesterol only comes from animal sources  - ie. meat, dairy, egg yolks  - Eat all the vegetables you want.  - Avoid meat, especially red meat - Beef AND Pork .  - Avoid Egg yolks, cheese & dairy - milk & ice cream.     - Cheese is the most concentrated form of trans-fats which  is the worst thing to clog up our arteries.   - Veggie cheese is OK which can be found in the fresh  produce section at [employer]  or [employer] or [employer] ============================================================ ============================================================  -  All Else - CBC - Kidneys - Electrolytes - Liver - Magnesium & Thyroid    - all  Normal / OK ============================================================ ============================================================

## 2021-03-29 ENCOUNTER — Other Ambulatory Visit: Payer: Self-pay | Admitting: Nurse Practitioner

## 2021-03-29 ENCOUNTER — Other Ambulatory Visit: Payer: Self-pay | Admitting: Internal Medicine

## 2021-03-29 DIAGNOSIS — M159 Polyosteoarthritis, unspecified: Secondary | ICD-10-CM

## 2021-03-29 DIAGNOSIS — M150 Primary generalized (osteo)arthritis: Secondary | ICD-10-CM

## 2021-03-30 ENCOUNTER — Other Ambulatory Visit: Payer: Self-pay | Admitting: Internal Medicine

## 2021-03-30 ENCOUNTER — Other Ambulatory Visit: Payer: Self-pay

## 2021-03-30 ENCOUNTER — Other Ambulatory Visit: Payer: Medicare Other

## 2021-03-30 DIAGNOSIS — R972 Elevated prostate specific antigen [PSA]: Secondary | ICD-10-CM

## 2021-03-30 DIAGNOSIS — N3000 Acute cystitis without hematuria: Secondary | ICD-10-CM

## 2021-03-30 DIAGNOSIS — M159 Polyosteoarthritis, unspecified: Secondary | ICD-10-CM

## 2021-03-30 MED ORDER — PREDNISONE 10 MG PO TABS: ORAL_TABLET | ORAL | 0 refills | Status: DC

## 2021-03-31 LAB — URINALYSIS, ROUTINE W REFLEX MICROSCOPIC
Bilirubin Urine: NEGATIVE
Glucose, UA: NEGATIVE
Hgb urine dipstick: NEGATIVE
Hyaline Cast: NONE SEEN /LPF
Ketones, ur: NEGATIVE
Leukocytes,Ua: NEGATIVE
Nitrite: NEGATIVE
Specific Gravity, Urine: 1.02 (ref 1.001–1.035)
Squamous Epithelial / HPF: NONE SEEN /HPF (ref <=5)
pH: 7 (ref 5.0–8.0)

## 2021-03-31 LAB — URINE CULTURE
MICRO NUMBER:: 12584467
SPECIMEN QUALITY:: ADEQUATE

## 2021-03-31 LAB — PSA, TOTAL AND FREE
PSA, % Free: 30 % (calc) (ref >25)
PSA, Free: 2.1 ng/mL
PSA, Total: 7.1 ng/mL — ABNORMAL HIGH (ref <=4.0)

## 2021-03-31 LAB — MICROSCOPIC MESSAGE

## 2021-04-01 NOTE — Progress Notes (Signed)
============================================================ ============================================================  -   Urine culture is Negative  -  PSA gone up even higher from 5.18 a year ago                                                           to 7.81 a week ago,                                                                                to now 7.1                                                                                 - Need to see the Urologist   ============================================================ ============================================================

## 2021-04-07 ENCOUNTER — Telehealth: Payer: Self-pay

## 2021-04-07 NOTE — Telephone Encounter (Signed)
The patient's wife called in to inquire if the Cipro that was sent to his pharmacy was for his PSA level or a UTI. She was informed that it was for his UTI. The patient's wife informed me that they had not picked up the prescription to date as she had Googled the medication and had concerns that needed to be answered. She also informed me that the patient would not take the medication. I encouraged her to please pick up the medication and have the patient to start it as it was dangerous for the patient not to be treated for the UTI. She verbalized understanding to this staff member reporting that she would get the medication and have him start it today.

## 2021-04-14 NOTE — Progress Notes (Signed)
Estill Bamberg gave patient his lab results over the phone. -e welch

## 2021-04-14 NOTE — Progress Notes (Signed)
LMOM again for patient to contact the office for lab results. -e welch

## 2021-05-06 ENCOUNTER — Other Ambulatory Visit: Payer: Self-pay | Admitting: Internal Medicine

## 2021-05-06 DIAGNOSIS — M159 Polyosteoarthritis, unspecified: Secondary | ICD-10-CM

## 2021-05-18 ENCOUNTER — Other Ambulatory Visit: Payer: Self-pay | Admitting: Internal Medicine

## 2021-05-18 DIAGNOSIS — M159 Polyosteoarthritis, unspecified: Secondary | ICD-10-CM

## 2021-05-18 MED ORDER — PREDNISONE 10 MG PO TABS: ORAL_TABLET | ORAL | 0 refills | Status: DC

## 2021-06-23 NOTE — Progress Notes (Signed)
3 MONTH FOLLOW UP Assessment:    Essential hypertension Continue Atenolol 100 MG qd Monitor blood pressure at home; call if consistently over 150/90 Continue DASH diet.   Reminder to go to the ER if any CP, SOB, nausea, dizziness, severe HA, changes vision/speech, left arm numbness and tingling and jaw pain. -     CBC with Differential/Platelet -     COMPLETE METABOLIC PANEL WITH GFR  Hyperlipidemia, mixed Continue medications: zetia 10 mg daily  Has been working on lifstyle Discussed dietary and exercise modifications Low fat diet -     Lipid panel  Abnormal glucose Discussed dietary and exercise modifications  Vitamin D deficiency Continue supplementation to maintain goal of 60-100 Taking Vitamin D 5,000 IU daily  Idiopathic gout, unspecified chronicity, unspecified site Continue allopurinol 349m daily No recent flares Discussed dietary modifications Continue to monitor  Hypothyroidism, unspecified type Taking levothyroxine 200 mcg, takes at night Reminder to take on an empty stomach 30-627ms before first meal of the day. No antacid medications for 4 hours. -     TSH  Aortic atherosclerosis (HCC) Control blood pressure, lipids and glucose Disscused lifestyle modifications, diet & exercise Continue to monitor  Obesity, BMI 34.0-34.9,adult Continue to recommend diet heavy in fruits and veggies and low in animal meats, cheeses, and dairy products, appropriate calorie intake Discuss exercise recommendations routinely Continue to monitor weight at each visit  Medication management Continued  Black Hairy tongue Improved hydration, use of humidifiers, sugar-free chewing gums or candy, and mucosal lubricants/saliva substitutes. Avoid tobacco If persists with behavior modification notify the office    Future Appointments  Date Time Provider [place]/28/2023 11:30 AM CoLiane ComberNP [facility] None  03/27/2022  2:00 PM McUnk PintoMD  [facility] None      Subjective:  [name] a [age].o. male who presents for 3 month follow up for HTN, hyperlipidemia, prediabetes, and vitamin D Def.   Continues to have black hairy tongue, is brushing his tongue daily. Nothing he has tried so far has resolved the condition.  He has history of nonfocal TIA in 2008, no sequela. States memory is unchanged, some short term memory issues. No incontinence.   His blood pressure has been controlled at home, today their BP is BP: (!) 146/82. BP's are running 140/80's at home. Currently on Atenolol 100 mg QD BP Readings from Last 3 Encounters:  06/27/21 (!) 146/82  03/24/21 140/80  12/23/20 (!) 164/87    He does not workout. He denies chest pain, shortness of breath, dizziness.   BMI is Body mass index is 34.02 kg/m., he is not working on diet and exercise,  Wt Readings from Last 3 Encounters:  06/27/21 210 lb 12.8 oz (95.6 kg)  03/24/21 200 lb 9.6 oz (91 kg)  12/23/20 201 lb 6.4 oz (91.4 kg)   He is on cholesterol medication, he is on ezetimibe 10 mg daily, reports hasn't been taking crestor and denies myalgias. His cholesterol is not at goal. The cholesterol last visit was:   Lab Results  Component Value Date   CHOL 181 03/24/2021   HDL 40 03/24/2021   LDLCALC 113 (H) 03/24/2021   TRIG 163 (H) 03/24/2021   CHOLHDL 4.5 03/24/2021   Last A1C in the office was normal, did have A1C of 6.4 in 2011 but has decreased with exercise/diet:  Lab Results  Component Value Date   HGBA1C 4.7 03/24/2021   Patient is on Vitamin D supplement.   Lab Results  Component Value Date   VD25OH 62 03/24/2021     He is on thyroid medication. His medication was not changed last visit. He is 1 tab (200 mcg) daily at night.  Patient denies nervousness, palpitations and weight changes.  Lab Results  Component Value Date   TSH 1.84 03/24/2021  .  He has 3 kids, 5 grand kids and 1 great grand kid.  Patient is on allopurinol for gout and does  not report a recent flare. Lab Results  Component Value Date   LABURIC 4.2 03/24/2021    Medication Review: Current Outpatient Medications on File Prior to Visit  Medication Sig Dispense Refill   allopurinol (ZYLOPRIM) 300 MG tablet TAKE 1 TABLET EVERY DAY  TO  PREVENT  GOUT 90 tablet 3   Ascorbic Acid (VITAMIN C PO) Take 2,000 mg by mouth daily.      atenolol (TENORMIN) 100 MG tablet TAKE 1 TABLET BY MOUTH  DAILY FOR BLOOD PRESSURE 90 tablet 3   Beta Carotene (VITAMIN A) 25000 UNIT capsule Take 25,000 Units by mouth daily.     Blood Glucose Calibration (ACCU-CHEK SMARTVIEW CONTROL) LIQD Check blood sugar 1 time daily-DX-R73.03 1 each 3   Blood Glucose Monitoring Suppl (ACCU-CHEK AVIVA PLUS) w/Device KIT CHECK BLOOD SUGAR 1 TIME  DAILY 1 kit 99   Cholecalciferol (VITAMIN D3) 125 MCG (5000 UT) CAPS Takes 1 capsule Daily     ezetimibe (ZETIA) 10 MG tablet TAKE 1 TABLET BY MOUTH  DAILY FOR CHOLESTEROL 90 tablet 3   Garlic 5697 MG TBEC Take by mouth.     levothyroxine (SYNTHROID) 200 MCG tablet TAKE 1 TAB DAILY ON AN EMPTY STOMACH WITH ONLY WATER FOR 30 MIN AND NO ANTACID MEDS, CALCIUM, MAGNESIUM FOR 4 HRS. AVOID BIOTIN SYNTHROID TAB 200MCG 90 tablet 3   MAGNESIUM PO Take 250 mg by mouth 3 (three) times daily.      predniSONE (DELTASONE) 10 MG tablet TAKE 1 TABLET EVERY 2-3 DAYS, MUST LAST LONGER THAN 60 DAYS 30 tablet 0   thiamine (VITAMIN B-1) 50 MG tablet Take 50 mg by mouth daily.     vitamin B-6 (PYRIDOXINE) 25 MG tablet Take 25 mg by mouth daily.     rosuvastatin (CRESTOR) 5 MG tablet Take 1 tab daily in the evening for cholesterol. (Patient not taking: Reported on 06/27/2021) 90 tablet 3   No current facility-administered medications on file prior to visit.    Current Problems (verified) Patient Active Problem List   Diagnosis Date Noted   Aortic atherosclerosis ([place]) by Abd U/S in 2012  04/10/2017   Obesity (BMI 30.0-34.9) 07/07/2014   DJD  07/07/2014   Polymyalgia rheumatica  ([place]) 07/07/2014   Hx of adenomatous colonic polyps 06/18/2014   Other abnormal glucose 09/25/2013   Vitamin D deficiency 09/25/2013   Medication management 09/25/2013   Cataracts, bilateral    Gout    Hypertension    Hypothyroidism    BPH with elevated PSA    Hyperlipidemia      Allergies Allergies  Allergen Reactions   Lotensin [Benazepril Hcl]     unknown   Penicillins Rash    SURGICAL HISTORY He  has a past surgical history that includes Cataract extraction (Right, 2009); Shoulder arthroscopy (Right); and Colonoscopy w/ biopsies. FAMILY HISTORY His family history includes COPD in his father; Cancer in his mother. SOCIAL HISTORY He  reports that he quit smoking about 62 years ago. His smoking use included cigarettes. He has never used smokeless tobacco. He  reports that he does not drink alcohol and does not use drugs.  Review of Systems  Constitutional:  Negative for malaise/fatigue and weight loss.  HENT:  Negative for congestion, hearing loss, sore throat and tinnitus.        Black fuzzy tongue coating, persistent  Eyes:  Negative for blurred vision and double vision.  Respiratory:  Negative for cough, shortness of breath and wheezing.   Cardiovascular:  Negative for chest pain, palpitations, orthopnea, claudication and leg swelling.  Gastrointestinal:  Negative for abdominal pain, blood in stool, constipation, diarrhea, heartburn, melena, nausea and vomiting.  Genitourinary: Negative.   Musculoskeletal:  Negative for joint pain and myalgias.  Skin:  Negative for rash.  Neurological:  Negative for dizziness, tingling, sensory change, weakness and headaches.  Endo/Heme/Allergies:  Negative for polydipsia.  Psychiatric/Behavioral: Negative.    All other systems reviewed and are negative.   Objective:   Blood pressure (!) 146/82, pulse (!) 56, temperature (!) 97.5 F (36.4 C), weight 210 lb 12.8 oz (95.6 kg), SpO2 99 %. Body mass index is 34.02 kg/m.  General  appearance: alert, no distress, WD/WN, male HEENT: normocephalic, sclerae anicteric, TMs pearly, nares patent, no discharge or erythema, pharynx normal Oral cavity: MMM, no lesions; generalized post 2/3rds of tongue covered in black/dark green coating that scrapes clear with tongue blade; poor oral hygiene with missing teeth and tartar Neck: supple, no lymphadenopathy, no thyromegaly, no masses Heart: RRR, normal S1, S2, no murmurs Lungs: CTA bilaterally, no wheezes, rhonchi, or rales Abdomen: +bs, soft, non tender, non distended, no masses, no hepatomegaly, no splenomegaly Musculoskeletal: nontender, no swelling, no obvious deformity Extremities: no edema, no cyanosis, no clubbing Pulses: 2+ symmetric, upper and lower extremities, normal cap refill Neurological: alert, oriented x 3, CN2-12 intact, strength normal upper extremities and lower extremities, sensation normal throughout, DTRs 2+ throughout, no cerebellar signs, gait normal Psychiatric: normal affect, behavior normal, pleasant      Magda Bernheim, NP   06/27/2021

## 2021-06-27 ENCOUNTER — Other Ambulatory Visit: Payer: Self-pay

## 2021-06-27 ENCOUNTER — Ambulatory Visit (INDEPENDENT_AMBULATORY_CARE_PROVIDER_SITE_OTHER): Payer: Medicare PPO | Admitting: Nurse Practitioner

## 2021-06-27 ENCOUNTER — Encounter: Payer: Self-pay | Admitting: Nurse Practitioner

## 2021-06-27 VITALS — BP 146/82 | HR 56 | Temp 97.5°F | Wt 210.8 lb

## 2021-06-27 DIAGNOSIS — E669 Obesity, unspecified: Secondary | ICD-10-CM

## 2021-06-27 DIAGNOSIS — I10 Essential (primary) hypertension: Secondary | ICD-10-CM

## 2021-06-27 DIAGNOSIS — E782 Mixed hyperlipidemia: Secondary | ICD-10-CM | POA: Diagnosis not present

## 2021-06-27 DIAGNOSIS — Z6834 Body mass index (BMI) 34.0-34.9, adult: Secondary | ICD-10-CM

## 2021-06-27 DIAGNOSIS — R7309 Other abnormal glucose: Secondary | ICD-10-CM

## 2021-06-27 DIAGNOSIS — E6609 Other obesity due to excess calories: Secondary | ICD-10-CM

## 2021-06-27 DIAGNOSIS — E559 Vitamin D deficiency, unspecified: Secondary | ICD-10-CM

## 2021-06-27 DIAGNOSIS — Z79899 Other long term (current) drug therapy: Secondary | ICD-10-CM | POA: Diagnosis not present

## 2021-06-27 DIAGNOSIS — M1000 Idiopathic gout, unspecified site: Secondary | ICD-10-CM | POA: Diagnosis not present

## 2021-06-27 DIAGNOSIS — E039 Hypothyroidism, unspecified: Secondary | ICD-10-CM | POA: Diagnosis not present

## 2021-06-27 DIAGNOSIS — I700 Atherosclerosis of aorta: Secondary | ICD-10-CM

## 2021-06-27 MED ORDER — EZETIMIBE 10 MG PO TABS: ORAL_TABLET | ORAL | 3 refills | Status: DC

## 2021-06-27 MED ORDER — ATENOLOL 100 MG PO TABS: ORAL_TABLET | ORAL | 3 refills | Status: DC

## 2021-06-27 MED ORDER — ALLOPURINOL 300 MG PO TABS: ORAL_TABLET | ORAL | 3 refills | Status: DC

## 2021-06-27 MED ORDER — LEVOTHYROXINE SODIUM 200 MCG PO TABS: ORAL_TABLET | ORAL | 3 refills | Status: DC

## 2021-06-27 NOTE — Patient Instructions (Signed)
Improve hydration, use of humidifiers, sugar-free chewing gums or candy, and mucosal lubricants/saliva substitutes.  Avoid tobacco use Brush tongue

## 2021-06-28 LAB — COMPLETE METABOLIC PANEL WITH GFR
AG Ratio: 1.6 (calc) (ref 1.0–2.5)
ALT: 52 U/L — ABNORMAL HIGH (ref 9–46)
AST: 50 U/L — ABNORMAL HIGH (ref 10–35)
Albumin: 4 g/dL (ref 3.6–5.1)
Alkaline phosphatase (APISO): 102 U/L (ref 35–144)
BUN: 9 mg/dL (ref 7–25)
CO2: 33 mmol/L — ABNORMAL HIGH (ref 20–32)
Calcium: 9.1 mg/dL (ref 8.6–10.3)
Chloride: 105 mmol/L (ref 98–110)
Creat: 0.81 mg/dL (ref 0.70–1.28)
Globulin: 2.5 g/dL (calc) (ref 1.9–3.7)
Glucose, Bld: 73 mg/dL (ref 65–99)
Potassium: 4.3 mmol/L (ref 3.5–5.3)
Sodium: 142 mmol/L (ref 135–146)
Total Bilirubin: 0.8 mg/dL (ref 0.2–1.2)
Total Protein: 6.5 g/dL (ref 6.1–8.1)
eGFR: 90 mL/min/{1.73_m2} (ref >=60)

## 2021-06-28 LAB — CBC WITH DIFFERENTIAL/PLATELET
Absolute Monocytes: 440 cells/uL (ref 200–950)
Basophils Absolute: 39 cells/uL (ref 0–200)
Basophils Relative: 0.7 %
Eosinophils Absolute: 231 cells/uL (ref 15–500)
Eosinophils Relative: 4.2 %
HCT: 42 % (ref 38.5–50.0)
Hemoglobin: 14.4 g/dL (ref 13.2–17.1)
Lymphs Abs: 1507 cells/uL (ref 850–3900)
MCH: 31.3 pg (ref 27.0–33.0)
MCHC: 34.3 g/dL (ref 32.0–36.0)
MCV: 91.3 fL (ref 80.0–100.0)
MPV: 10.6 fL (ref 7.5–12.5)
Monocytes Relative: 8 %
Neutro Abs: 3284 cells/uL (ref 1500–7800)
Neutrophils Relative %: 59.7 %
Platelets: 181 10*3/uL (ref 140–400)
RBC: 4.6 10*6/uL (ref 4.20–5.80)
RDW: 13.2 % (ref 11.0–15.0)
Total Lymphocyte: 27.4 %
WBC: 5.5 10*3/uL (ref 3.8–10.8)

## 2021-06-28 LAB — LIPID PANEL
Cholesterol: 198 mg/dL (ref <200)
HDL: 43 mg/dL (ref >=40)
LDL Cholesterol (Calc): 132 mg/dL (calc) — ABNORMAL HIGH
Non-HDL Cholesterol (Calc): 155 mg/dL (calc) — ABNORMAL HIGH (ref <130)
Total CHOL/HDL Ratio: 4.6 (calc) (ref <5.0)
Triglycerides: 120 mg/dL (ref <150)

## 2021-06-28 LAB — TSH: TSH: 3.02 mIU/L (ref 0.40–4.50)

## 2021-07-01 ENCOUNTER — Other Ambulatory Visit: Payer: Self-pay | Admitting: Internal Medicine

## 2021-07-01 DIAGNOSIS — M159 Polyosteoarthritis, unspecified: Secondary | ICD-10-CM

## 2021-07-07 ENCOUNTER — Telehealth: Payer: Self-pay

## 2021-07-07 NOTE — Telephone Encounter (Signed)
Patient is wanting a refill on Prednisone 10mg .   I left a message to see why he wanted a refill on this.  [facility] # [phone]

## 2021-07-11 ENCOUNTER — Other Ambulatory Visit: Payer: Self-pay

## 2021-07-11 DIAGNOSIS — E039 Hypothyroidism, unspecified: Secondary | ICD-10-CM

## 2021-07-11 DIAGNOSIS — E782 Mixed hyperlipidemia: Secondary | ICD-10-CM

## 2021-07-11 DIAGNOSIS — I10 Essential (primary) hypertension: Secondary | ICD-10-CM

## 2021-07-11 DIAGNOSIS — M1000 Idiopathic gout, unspecified site: Secondary | ICD-10-CM

## 2021-07-11 MED ORDER — ALLOPURINOL 300 MG PO TABS: ORAL_TABLET | ORAL | 3 refills | Status: DC

## 2021-07-11 MED ORDER — ATENOLOL 100 MG PO TABS: ORAL_TABLET | ORAL | 3 refills | Status: DC

## 2021-07-11 MED ORDER — EZETIMIBE 10 MG PO TABS: ORAL_TABLET | ORAL | 3 refills | Status: DC

## 2021-07-11 MED ORDER — LEVOTHYROXINE SODIUM 200 MCG PO TABS: ORAL_TABLET | ORAL | 3 refills | Status: AC

## 2021-07-25 ENCOUNTER — Other Ambulatory Visit: Payer: Self-pay | Admitting: Nurse Practitioner

## 2021-07-25 ENCOUNTER — Telehealth: Payer: Self-pay | Admitting: Internal Medicine

## 2021-07-25 DIAGNOSIS — M159 Polyosteoarthritis, unspecified: Secondary | ICD-10-CM

## 2021-07-25 MED ORDER — PREDNISONE 10 MG PO TABS: ORAL_TABLET | ORAL | 0 refills | Status: DC

## 2021-07-25 NOTE — Telephone Encounter (Signed)
Pt is requesting a refill of Prednisone be sent to [facility] in [place]

## 2021-08-03 DIAGNOSIS — R972 Elevated prostate specific antigen [PSA]: Secondary | ICD-10-CM | POA: Diagnosis not present

## 2021-08-10 DIAGNOSIS — N5201 Erectile dysfunction due to arterial insufficiency: Secondary | ICD-10-CM | POA: Diagnosis not present

## 2021-08-10 DIAGNOSIS — R972 Elevated prostate specific antigen [PSA]: Secondary | ICD-10-CM | POA: Diagnosis not present

## 2021-10-08 ENCOUNTER — Other Ambulatory Visit: Payer: Self-pay | Admitting: Nurse Practitioner

## 2021-10-08 DIAGNOSIS — M159 Polyosteoarthritis, unspecified: Secondary | ICD-10-CM

## 2021-11-26 ENCOUNTER — Other Ambulatory Visit: Payer: Self-pay | Admitting: Internal Medicine

## 2021-11-26 DIAGNOSIS — M159 Polyosteoarthritis, unspecified: Secondary | ICD-10-CM

## 2021-11-29 ENCOUNTER — Other Ambulatory Visit: Payer: Self-pay | Admitting: Internal Medicine

## 2021-11-29 DIAGNOSIS — M159 Polyosteoarthritis, unspecified: Secondary | ICD-10-CM

## 2021-12-02 ENCOUNTER — Other Ambulatory Visit: Payer: Self-pay | Admitting: Internal Medicine

## 2021-12-02 DIAGNOSIS — M159 Polyosteoarthritis, unspecified: Secondary | ICD-10-CM

## 2021-12-06 ENCOUNTER — Other Ambulatory Visit: Payer: Self-pay | Admitting: Internal Medicine

## 2021-12-06 DIAGNOSIS — M150 Primary generalized (osteo)arthritis: Secondary | ICD-10-CM

## 2021-12-06 DIAGNOSIS — M159 Polyosteoarthritis, unspecified: Secondary | ICD-10-CM

## 2021-12-09 ENCOUNTER — Other Ambulatory Visit: Payer: Self-pay | Admitting: Internal Medicine

## 2021-12-09 DIAGNOSIS — M159 Polyosteoarthritis, unspecified: Secondary | ICD-10-CM

## 2021-12-23 ENCOUNTER — Ambulatory Visit: Payer: Medicare Other | Admitting: Nurse Practitioner

## 2022-01-25 ENCOUNTER — Other Ambulatory Visit: Payer: Self-pay | Admitting: Internal Medicine

## 2022-01-25 DIAGNOSIS — M159 Polyosteoarthritis, unspecified: Secondary | ICD-10-CM

## 2022-03-26 ENCOUNTER — Encounter: Payer: Self-pay | Admitting: Internal Medicine

## 2022-03-26 NOTE — Progress Notes (Unsigned)
Annual  Screening/Preventative Visit  & Comprehensive Evaluation & Examination  Future Appointments  Date Time Provider Department  03/27/2022  2:00 PM Unk Pinto, MD [facility]  12/26/2022 11:00 AM Darrol Jump, NP [facility]  04/02/2023  2:00 PM Unk Pinto, MD [facility]              This very nice 81 y.o. MWM with  HTN, HLD, Prediabetes, Hypothyroidism  and Vitamin D Deficiency  presents for a Screening /Preventative Visit & comprehensive evaluation and management of multiple medical co-morbidities.  Abd U/S  in 2012 demonstrated Aortic Atherosclerosis. Patient has hx/o chronic pain syndrome  from DJD managed by low dose steroids . Patient has Gout controlled on his meds .       HTN predates since 1997. Patient's BP has been controlled at home.  Today's BP is at goal  -                       . Patient denies any cardiac symptoms as chest pain, palpitations, shortness of breath, dizziness or ankle swelling.       Patient's hyperlipidemia is not controlled with diet and Rosuvastatin /Ezetimibe. Patient denies myalgias or other medication SE's. Last lipids were still not at goal :  Lab Results  Component Value Date   CHOL 198 06/27/2021   HDL 43 06/27/2021   LDLCALC 132 (H) 06/27/2021   TRIG 120 06/27/2021   CHOLHDL 4.6 06/27/2021        Patient has moderate Obesity  (BMI 34+) and hx/o prediabetes (A1c 6.4% /2011) and patient denies reactive hypoglycemic symptoms, visual blurring, diabetic polys or paresthesias. Last A1c was normal & at goal :   Lab Results  Component Value Date   HGBA1C 4.7 03/24/2021         Patient has been on thyroid replacement sincewas dx'd Hypothyroid in 2002 .        Finally, patient has history of Vitamin D Deficiency ("36" /2008) and last vitamin D was at goal:   Lab Results  Component Value Date   VD25OH 91 03/24/2021       Current Outpatient Medications  Medication Instructions   allopurinol 300 MG tablet TAKE 1 TABLET  EVERY DAY     VITAMIN C    2,000 mg Daily   atenolol 100 MG tablet TAKE 1 TABLET DAILY    VITAMIN D  5000 u Takes 1 capsule Daily   Ezetimibe  10 MG tablet TAKE 1 TABLET   DAILY   Garlic 7619 MG  Oral   levothyroxine 200 MCG tablet TAKE 1 TAB DAILY    MAGNESIUM    250 mg  Take 3  times daily   predniSONE  5 MG tablet Take 1 tablet every 3 days for 3 weeks then STOP   pyridOXINE (VITAMIN B6)  25 mg Daily   rosuvastatin  5 MG tablet Take 1 tab daily    thiamine (VITAMIN B-1)  50 mg Daily   vitamin A  25,000 Units Daily       Allergies  Allergen Reactions   Lotensin [Benazepril Hcl]    Penicillins Rash     Past Medical History:  Diagnosis Date   BPH with elevated PSA    Cataracts, bilateral    Diverticulosis    Gout    Hx of adenomatous colonic polyps 06/18/2014   Hyperlipidemia    Hypertension    Prediabetes    Thyroid disease    Tubular adenoma 02/17/2003  Umbilical hernia 95/28/4132    Health Maintenance  Topic Date Due   COVID-19 Vaccine (3 - Pfizer ) 08/26/2019   Zoster Vaccines- Shingrix (1 of 2) 03/25/2021 (Originally 08/22/1959)   TETANUS/TDAP  12/23/2021 (Originally 09/08/2019)   Hepatitis C Screening  12/23/2021 (Originally 08/22/1958)   COLONOSCOPY  05/16/2021   Pneumonia Vaccine 72+ Years old 12/23/2021   HPV VACCINES  Aged Out   INFLUENZA VACCINE  Discontinued    Immunization History  Administered Date(s) Administered   Influenza 01/27/2018   PFIZER SARS-COV-2 Vacc 07/04/2019, 07/29/2019   Pneumococcal -23 08/19/2008, 12/23/2020   Td 09/07/2009    Last Colon - 05/16/2018 - Dr Carlean Purl - Negative & Recc no F/U due to age   Past Surgical History:  Procedure Laterality Date   CATARACT EXTRACTION Right 2009   COLONOSCOPY W/ BIOPSIES     SHOULDER ARTHROSCOPY Right    1979     Family History  Problem Relation Age of Onset   Cancer Mother    COPD Father    Colon cancer Neg Hx    Esophageal cancer Neg Hx    Rectal cancer Neg Hx    Stomach  cancer Neg Hx      Social History  Married - wife is Vaughan Basta - 3 children  Tobacco Use   Smoking status: Former    Types: Cigarettes    Quit date: 05/30/1959    Years since quitting: 61.8   Smokeless tobacco: Never  Vaping Use   Vaping Use: Never used  Substance Use Topics   Alcohol use: No   Drug use: No      ROS Constitutional: Denies fever, chills, weight loss/gain, headaches, insomnia,  night sweats or change in appetite. Does c/o fatigue. Eyes: Denies redness, blurred vision, diplopia, discharge, itchy or watery eyes.  ENT: Denies discharge, congestion, post nasal drip, epistaxis, sore throat, earache, hearing loss, dental pain, Tinnitus, Vertigo, Sinus pain or snoring.  Cardio: Denies chest pain, palpitations, irregular heartbeat, syncope, dyspnea, diaphoresis, orthopnea, PND, claudication or edema Respiratory: denies cough, dyspnea, DOE, pleurisy, hoarseness, laryngitis or wheezing.  Gastrointestinal: Denies dysphagia, heartburn, reflux, water brash, pain, cramps, nausea, vomiting, bloating, diarrhea, constipation, hematemesis, melena, hematochezia, jaundice or hemorrhoids Genitourinary: Denies dysuria, frequency, discharge, hematuria or flank pain. Has urgency, nocturia x 1-3 & occasional hesitancy. Musculoskeletal: Denies arthralgia, myalgia, stiffness, Jt. Swelling, pain, limp or strain/sprain. Denies Falls. Skin: Denies puritis, rash, hives, warts, acne, eczema or change in skin lesion Neuro: No weakness, tremor, incoordination, spasms, paresthesia or pain Psychiatric: Denies confusion, memory loss or sensory loss. Denies Depression. Endocrine: Denies change in weight, skin, hair change, nocturia, and paresthesia, diabetic polys, visual blurring or hyper / hypo glycemic episodes.  Heme/Lymph: No excessive bleeding, bruising or enlarged lymph nodes.   Physical Exam  There were no vitals taken for this visit.  General Appearance: Over nourished  and in no apparent  distress.  Eyes: PERRLA, EOMs, conjunctiva no swelling or erythema, normal fundi and vessels. Sinuses: No frontal/maxillary tenderness ENT/Mouth: EACs patent / TMs  nl. Nares clear without erythema, swelling, mucoid exudates. Oral hygiene is good. No erythema, swelling, or exudate. Tongue normal, non-obstructing. Tonsils not swollen or erythematous. Hearing normal.  Neck: Supple, thyroid not palpable. No bruits, nodes or JVD. Respiratory: Respiratory effort normal.  BS equal and clear bilateral without rales, rhonci, wheezing or stridor. Cardio: Heart sounds are normal with regular rate and rhythm and no murmurs, rubs or gallops. Peripheral pulses are normal and equal bilaterally without edema. No aortic  or femoral bruits. Chest: symmetric with normal excursions and percussion.  Abdomen: Soft, with Nl bowel sounds. Nontender, no guarding, rebound, hernias, masses, or organomegaly.  Lymphatics: Non tender without lymphadenopathy.  Musculoskeletal: Full ROM all peripheral extremities, joint stability, 5/5 strength, and normal gait. Skin: Warm and dry without rashes, lesions, cyanosis, clubbing or  ecchymosis.  Neuro: Cranial nerves intact, reflexes equal bilaterally. Normal muscle tone, no cerebellar symptoms. Sensation intact.  Pysch: Alert and oriented x 3 with normal affect, insight and judgment appropriate.   Assessment and Plan  1. Annual Preventative/Screening Exam    2. Essential hypertension  - EKG 12-Lead - [place], RETROPERITNL ABD,  LTD - Urinalysis, Routine w reflex microscopic - Microalbumin / creatinine urine ratio - CBC with Differential/Platelet - COMPLETE METABOLIC PANEL WITH GFR - Magnesium - TSH  3. Hyperlipidemia, mixed  - EKG 12-Lead - [place], RETROPERITNL ABD,  LTD - Lipid panel - TSH  4. Abnormal glucose  - EKG 12-Lead - [place], RETROPERITNL ABD,  LTD - Hemoglobin A1c - Insulin, random  5. Vitamin D deficiency  - VITAMIN D 25 Hydroxy  6. Hypothyroidism  -  TSH  7. Idiopathic gout - Uric acid  8. Aortic atherosclerosis (HCC)  - EKG 12-Lead - [place], RETROPERITNL ABD,  LTD  9. BPH with obstruction/lower urinary tract symptoms  - PSA  10. Class 1 obesity due to excess calories with serious comorbidity  and body mass index (BMI) of 34.0 to 34.9 in adult   11. Screening for colorectal cancer  - POC Hemoccult Bld/Stl  12. Prostate cancer screening  - PSA  13. Screening for heart disease  - EKG 12-Lead  14. Screening for AAA (aortic abdominal aneurysm)  - [place], RETROPERITNL ABD,  LTD  15. Medication management  - Urinalysis, Routine w reflex microscopic - Microalbumin / creatinine urine ratio - CBC with Differential/Platelet - COMPLETE METABOLIC PANEL WITH GFR - Magnesium - Lipid panel - TSH - Hemoglobin A1c - Insulin, random - VITAMIN D 25 Hydroxy             Patient was counseled in prudent diet, weight control to achieve/maintain BMI less than 25, BP monitoring, regular exercise and medications as discussed.  Discussed med effects and SE's. Routine screening labs and tests as requested with regular follow-up as recommended. Over 40 minutes of exam, counseling, chart review and high complex critical decision making was performed   Kirtland Bouchard, MD

## 2022-03-26 NOTE — Patient Instructions (Signed)

## 2022-03-27 ENCOUNTER — Encounter: Payer: Self-pay | Admitting: Internal Medicine

## 2022-03-27 ENCOUNTER — Ambulatory Visit (INDEPENDENT_AMBULATORY_CARE_PROVIDER_SITE_OTHER): Payer: Medicare HMO | Admitting: Internal Medicine

## 2022-03-27 VITALS — BP 134/78 | HR 72 | Temp 97.9°F | Resp 17 | Ht 66.0 in | Wt 218.2 lb

## 2022-03-27 DIAGNOSIS — N138 Other obstructive and reflux uropathy: Secondary | ICD-10-CM

## 2022-03-27 DIAGNOSIS — Z125 Encounter for screening for malignant neoplasm of prostate: Secondary | ICD-10-CM

## 2022-03-27 DIAGNOSIS — Z79899 Other long term (current) drug therapy: Secondary | ICD-10-CM | POA: Diagnosis not present

## 2022-03-27 DIAGNOSIS — E782 Mixed hyperlipidemia: Secondary | ICD-10-CM

## 2022-03-27 DIAGNOSIS — I10 Essential (primary) hypertension: Secondary | ICD-10-CM | POA: Diagnosis not present

## 2022-03-27 DIAGNOSIS — E559 Vitamin D deficiency, unspecified: Secondary | ICD-10-CM

## 2022-03-27 DIAGNOSIS — E6609 Other obesity due to excess calories: Secondary | ICD-10-CM

## 2022-03-27 DIAGNOSIS — Z1211 Encounter for screening for malignant neoplasm of colon: Secondary | ICD-10-CM

## 2022-03-27 DIAGNOSIS — Z23 Encounter for immunization: Secondary | ICD-10-CM | POA: Diagnosis not present

## 2022-03-27 DIAGNOSIS — R7309 Other abnormal glucose: Secondary | ICD-10-CM | POA: Diagnosis not present

## 2022-03-27 DIAGNOSIS — M159 Polyosteoarthritis, unspecified: Secondary | ICD-10-CM

## 2022-03-27 DIAGNOSIS — E039 Hypothyroidism, unspecified: Secondary | ICD-10-CM

## 2022-03-27 DIAGNOSIS — Z0000 Encounter for general adult medical examination without abnormal findings: Secondary | ICD-10-CM

## 2022-03-27 DIAGNOSIS — E66811 Obesity, class 1: Secondary | ICD-10-CM

## 2022-03-27 DIAGNOSIS — I700 Atherosclerosis of aorta: Secondary | ICD-10-CM

## 2022-03-27 DIAGNOSIS — Z136 Encounter for screening for cardiovascular disorders: Secondary | ICD-10-CM

## 2022-03-27 DIAGNOSIS — M1000 Idiopathic gout, unspecified site: Secondary | ICD-10-CM

## 2022-03-27 DIAGNOSIS — M150 Primary generalized (osteo)arthritis: Secondary | ICD-10-CM

## 2022-03-27 DIAGNOSIS — N401 Enlarged prostate with lower urinary tract symptoms: Secondary | ICD-10-CM | POA: Diagnosis not present

## 2022-03-27 DIAGNOSIS — Z0001 Encounter for general adult medical examination with abnormal findings: Secondary | ICD-10-CM

## 2022-03-27 MED ORDER — MELOXICAM 15 MG PO TABS: ORAL_TABLET | ORAL | 3 refills | Status: DC

## 2022-03-28 ENCOUNTER — Other Ambulatory Visit: Payer: Self-pay | Admitting: Internal Medicine

## 2022-03-28 DIAGNOSIS — E039 Hypothyroidism, unspecified: Secondary | ICD-10-CM

## 2022-03-28 LAB — COMPLETE METABOLIC PANEL WITH GFR
AG Ratio: 1.5 (calc) (ref 1.0–2.5)
ALT: 33 U/L (ref 9–46)
AST: 43 U/L — ABNORMAL HIGH (ref 10–35)
Albumin: 4 g/dL (ref 3.6–5.1)
Alkaline phosphatase (APISO): 120 U/L (ref 35–144)
BUN: 15 mg/dL (ref 7–25)
CO2: 28 mmol/L (ref 20–32)
Calcium: 9.3 mg/dL (ref 8.6–10.3)
Chloride: 101 mmol/L (ref 98–110)
Creat: 0.95 mg/dL (ref 0.70–1.28)
Globulin: 2.7 g/dL (calc) (ref 1.9–3.7)
Glucose, Bld: 89 mg/dL (ref 65–99)
Potassium: 4.3 mmol/L (ref 3.5–5.3)
Sodium: 138 mmol/L (ref 135–146)
Total Bilirubin: 0.9 mg/dL (ref 0.2–1.2)
Total Protein: 6.7 g/dL (ref 6.1–8.1)
eGFR: 81 mL/min/{1.73_m2} (ref >=60)

## 2022-03-28 LAB — CBC WITH DIFFERENTIAL/PLATELET
Absolute Monocytes: 591 cells/uL (ref 200–950)
Basophils Absolute: 31 cells/uL (ref 0–200)
Basophils Relative: 1.1 %
Eosinophils Absolute: 50 cells/uL (ref 15–500)
Eosinophils Relative: 1.8 %
HCT: 45.3 % (ref 38.5–50.0)
Hemoglobin: 15.4 g/dL (ref 13.2–17.1)
Lymphs Abs: 876 cells/uL (ref 850–3900)
MCH: 31.3 pg (ref 27.0–33.0)
MCHC: 34 g/dL (ref 32.0–36.0)
MCV: 92.1 fL (ref 80.0–100.0)
MPV: 10.5 fL (ref 7.5–12.5)
Monocytes Relative: 21.1 %
Neutro Abs: 1252 cells/uL — ABNORMAL LOW (ref 1500–7800)
Neutrophils Relative %: 44.7 %
Platelets: 149 10*3/uL (ref 140–400)
RBC: 4.92 10*6/uL (ref 4.20–5.80)
RDW: 13.1 % (ref 11.0–15.0)
Total Lymphocyte: 31.3 %
WBC: 2.8 10*3/uL — ABNORMAL LOW (ref 3.8–10.8)

## 2022-03-28 LAB — URINALYSIS, ROUTINE W REFLEX MICROSCOPIC
Bacteria, UA: NONE SEEN /HPF
Bilirubin Urine: NEGATIVE
Glucose, UA: NEGATIVE
Hgb urine dipstick: NEGATIVE
Hyaline Cast: NONE SEEN /LPF
Ketones, ur: NEGATIVE
Leukocytes,Ua: NEGATIVE
Nitrite: NEGATIVE
Specific Gravity, Urine: 1.024 (ref 1.001–1.035)
Squamous Epithelial / HPF: NONE SEEN /HPF (ref <=5)
WBC, UA: NONE SEEN /HPF (ref 0–5)
pH: 5.5 (ref 5.0–8.0)

## 2022-03-28 LAB — TSH: TSH: 6 mIU/L — ABNORMAL HIGH (ref 0.40–4.50)

## 2022-03-28 LAB — LIPID PANEL
Cholesterol: 184 mg/dL (ref <200)
HDL: 36 mg/dL — ABNORMAL LOW (ref >=40)
LDL Cholesterol (Calc): 122 mg/dL (calc) — ABNORMAL HIGH
Non-HDL Cholesterol (Calc): 148 mg/dL (calc) — ABNORMAL HIGH (ref <130)
Total CHOL/HDL Ratio: 5.1 (calc) — ABNORMAL HIGH (ref <5.0)
Triglycerides: 143 mg/dL (ref <150)

## 2022-03-28 LAB — HEMOGLOBIN A1C
Hgb A1c MFr Bld: 5.1 % of total Hgb (ref <5.7)
Mean Plasma Glucose: 100 mg/dL
eAG (mmol/L): 5.5 mmol/L

## 2022-03-28 LAB — MICROALBUMIN / CREATININE URINE RATIO
Creatinine, Urine: 199 mg/dL (ref 20–320)
Microalb Creat Ratio: 183 mcg/mg creat — ABNORMAL HIGH (ref <30)
Microalb, Ur: 36.5 mg/dL

## 2022-03-28 LAB — MICROSCOPIC MESSAGE

## 2022-03-28 LAB — URIC ACID: Uric Acid, Serum: 3.7 mg/dL — ABNORMAL LOW (ref 4.0–8.0)

## 2022-03-28 LAB — INSULIN, RANDOM: Insulin: 12.7 u[IU]/mL

## 2022-03-28 LAB — PSA: PSA: 4.64 ng/mL — ABNORMAL HIGH (ref <=4.00)

## 2022-03-28 LAB — MAGNESIUM: Magnesium: 1.9 mg/dL (ref 1.5–2.5)

## 2022-03-28 LAB — VITAMIN D 25 HYDROXY (VIT D DEFICIENCY, FRACTURES): Vit D, 25-Hydroxy: 51 ng/mL (ref 30–100)

## 2022-03-28 NOTE — Progress Notes (Signed)
<><><><><><><><><><><><><><><><><><><><><><><><><><><><><><><><><> <><><><><><><><><><><><><><><><><><><><><><><><><><><><><><><><><>  - PSA is down from 5.18    &    6.81 - lower to now 4.64  - Great !  <><><><><><><><><><><><><><><><><><><><><><><><><><><><><><><><><> <><><><><><><><><><><><><><><><><><><><><><><><><><><><><><><><><>  -  TSH test suggests your thyroid is low , this can be incorrect if you're not taking your medicine correctly as                                  on an empty stomach with only water for 30 minutes &                                  no Antacid meds, Calcium or Magnesium for 4 hours & avoid Biotin  & it's extremely important that you're not taking Biotin,                                                              as that can also falsify or cause incorrect Thyroid measurements    - If you're NOT taking a large dose of Biotin  & also taking your medicine correctly,   then I recommend that you  take an extra 1/2 tablet of your thyroid med 3 x /week on M W F   ie , take  1&1/2 tab    3 x /week on Mon Wed Fri  and 1 tab the other 4 days                                                                               ( that's 8&1/2 tabs /week)   Then please schedule a Nurse visit in 1 week to recheck your thyroid level  <><><><><><><><><><><><><><><><><><><><><><><><><><><><><><><><><> <><><><><><><><><><><><><><><><><><><><><><><><><><><><><><><><><>  -  Uric Acid level / Gout blood level is very low                                            - So it's extremely unlikely that you're having Gout symptoms  ! <><><><><><><><><><><><><><><><><><><><><><><><><><><><><><><><><> <><><><><><><><><><><><><><><><><><><><><><><><><><><><><><><><><>   - Total Chol =  184     is high risk for Heart Attack /Stroke /Vascular Dementia     ( Ideal or Goal is less than 180 ! )  & - Bad /Dangerous LDL Chol = 122  - - >> Sitting on a time Bomb !     (  Ideal or Goal is less than 70 ! )   - Treating with meds to lower Cholesterol is treating the result                                          & NOT treating the cause - The cause is Bad Diet !  -  Read or listen to   Dr Wyman Songster 's book    " How Not to Die ! "   - Recommend a stricter plant based low cholesterol diet   - Cholesterol only comes from animal sources                                                                             - ie. meat, dairy, egg yolks  - Eat all the vegetables you want.  - Avoid meat, Avoid Meat ,  Avoid Meat -                                                                 especially red meat - Beef AND Pork   - Avoid cheese & dairy - milk & ice cream.    - Cheese is the most concentrated form of trans-fats which                                                               is the worst thing to clog up our arteries  - Veggie cheese is OK which can be found in the fresh                             produce section at [employer] or [employer] or [employer]  - Continue your Zetia  ( Ezetimibe )  <><><><><><><><><><><><><><><><><><><><><><><><><><><><><><><><><> <><><><><><><><><><><><><><><><><><><><><><><><><><><><><><><><><>  -A1c - Normal - No Diabetes  - Great ! <><><><><><><><><><><><><><><><><><><><><><><><><><><><><><><><><> <><><><><><><><><><><><><><><><><><><><><><><><><><><><><><><><><>  - Vit D has dropped down from 96  and 91 to now = 51                                                                  -  Are you still taking 5,000 units every day  ?   <><><><><><><><><><><><><><><><><><><><><><><><><><><><><><><><><> <><><><><><><><><><><><><><><><><><><><><><><><><><><><><><><><><>

## 2022-04-18 NOTE — Progress Notes (Signed)
Patient is aware of his lab results and instructions and NV has been scheduled for next week to re-check his TSH

## 2022-04-25 ENCOUNTER — Ambulatory Visit (INDEPENDENT_AMBULATORY_CARE_PROVIDER_SITE_OTHER): Payer: Medicare HMO

## 2022-04-25 DIAGNOSIS — E039 Hypothyroidism, unspecified: Secondary | ICD-10-CM | POA: Diagnosis not present

## 2022-04-25 NOTE — Progress Notes (Signed)
Patient presents to the office for a nurse visit to have labs done to check TSH levels. Patient states that he takes 1.5 tablets TID and 1 tablet all other days. Takes the medication at night, advised the patient to take it first thing in the morning with just water and 30-60 minutes before eating.

## 2022-04-26 LAB — TSH: TSH: 0.7 mIU/L (ref 0.40–4.50)

## 2022-04-26 NOTE — Progress Notes (Signed)
<><><><><><><><><><><><><><><><><><><><><><><><><><><><><><><><><> <><><><><><><><><><><><><><><><><><><><><><><><><><><><><><><><><>  -   TSH - Thyroid Level is Perfect Now , So please continue dose the same   - 1 & 1/2 tab  3 x /week on MWF   and                                                  1 tab the other 4 days  TThSS =       8 & 1/2 tab / week   <><><><><><><><><><><><><><><><><><><><><><><><><><><><><><><><><> <><><><><><><><><><><><><><><><><><><><><><><><><><><><><><><><><>

## 2022-04-30 ENCOUNTER — Encounter: Payer: Self-pay | Admitting: Internal Medicine

## 2022-04-30 NOTE — Progress Notes (Unsigned)
Future Appointments  Date Time Provider Department  05/01/2022  9:00 AM Unk Pinto, MD [facility]  07/04/2022            wellness  3:30 PM Darrol Jump, NP [facility]  10/05/2022           6 month ov   2:30 PM Unk Pinto, MD [facility]  01/08/2023            9 mo ov  2:30 PM Darrol Jump, NP [facility]  04/10/2023             cpe   2:00 PM Unk Pinto, MD [facility]    History of Present Illness:     The patient is a very nice 81 y.o. MWM with  HTN, HLD, Prediabetes, Hypothyroidism  and Vitamin D Deficiency  who presents with concerns of a possible oral yeast infection on his tongue & rash on his chest .  Actually there is no rash , but he c/o intermittent itching of this anterior torso.      Also concerned re: worsening weight & is requesting a "pill to help with appetite supression Wt Readings from Last 3 Encounters:  05/01/22 221 lb 6.4 oz (100.4 kg)  04/25/22 221 lb (100.2 kg)  03/27/22 218 lb 3.2 oz (99 kg)   Weight 200 #  1 year ago.    Medications  Current Outpatient Medications (Endocrine & Metabolic):    levothyroxine (SYNTHROID) 200 MCG tablet, TAKE 1 TAB DAILY ON AN EMPTY STOMACH WITH ONLY WATER FOR 30 MIN AND NO ANTACID MEDS, CALCIUM, MAGNESIUM FOR 4 HRS. AVOID BIOTIN SYNTHROID TAB 200MCG  Current Outpatient Medications (Cardiovascular):    atenolol (TENORMIN) 100 MG tablet, TAKE 1 TABLET BY MOUTH  DAILY FOR BLOOD PRESSURE   ezetimibe (ZETIA) 10 MG tablet, TAKE 1 TABLET BY MOUTH  DAILY FOR CHOLESTEROL   Current Outpatient Medications (Analgesics):    allopurinol (ZYLOPRIM) 300 MG tablet, TAKE 1 TABLET EVERY DAY  TO  PREVENT  GOUT   meloxicam (MOBIC) 15 MG tablet, Take 1/2 to 1 tablet  Daily  with Food  for Pain & Inflammation   Current Outpatient Medications (Other):    Ascorbic Acid (VITAMIN C PO), Take 2,000 mg by mouth daily.    Beta Carotene (VITAMIN A) 25000 UNIT capsule, Take 25,000 Units by mouth daily.   Blood Glucose  Calibration (ACCU-CHEK SMARTVIEW CONTROL) LIQD, Check blood sugar 1 time daily-DX-R73.03   Blood Glucose Monitoring Suppl (ACCU-CHEK AVIVA PLUS) w/Device KIT, CHECK BLOOD SUGAR 1 TIME  DAILY   Cholecalciferol (VITAMIN D3) 125 MCG (5000 UT) CAPS, Takes 1 capsule Daily   Garlic 4388 MG TBEC, Take by mouth.   MAGNESIUM PO, Take 250 mg by mouth 3 (three) times daily.    thiamine (VITAMIN B-1) 50 MG tablet, Take 50 mg by mouth daily.   vitamin B-6 (PYRIDOXINE) 25 MG tablet, Take 25 mg by mouth daily.  Problem list He has Cataracts, bilateral; Gout; Hypertension; Hypothyroidism; BPH with elevated PSA; Hyperlipidemia; Other abnormal glucose; Vitamin D deficiency; Medication management; Hx of adenomatous colonic polyps; Obesity (BMI 30.0-34.9); DJD ; Polymyalgia rheumatica ([place]); and Aortic atherosclerosis ([place]) by Abd U/S in 2012  on their problem list.   Observations/Objective:  BP (!) 160/90   Pulse 89   Temp 97.9 F (36.6 C)   Resp 17   Ht _0  (1.676 m)   Wt 221 lb 6.4 oz (100.4 kg)   SpO2 98%   BMI 35.73 kg/m  HEENT - WNL except a black hairy tongue.  Skin - No rash is evident of the anterior chest or abdomen.   Assessment and Plan:   1. Class 2 severe obesity due to excess calories with serious  comorbidity and body mass index (BMI) of 35.73 (HCC)  - phentermine  37.5 MG tablet;   Take 1/2 to 1 tablet every Morning   Dispense: 90 tablet; Refill: 1  - topiramate 50 MG tablet; Take 1/2 to 1 tablet 2 x /day at Suppertime & Bedtime     Dispense: 180 tablet; Refill: 1    2. Essential hypertension  - not controlled .  - Advised frequent monitoring & call if remains elevated.     3. Hyperlipidemia, mixed   4. Pruritus ? Having urticaria.  - montelukast 10 MG tablet;  Take 1 tablet daily for Allergies  Dispense: 90 tablet; Refill: 3  - Also recommended take  OTC Cetirizine 10 mg qhs.    5. Hairy Leukoplakia of Tongue  - Discussed using a 50 % mix of H2O2 with  Mouthwash bid,    Brush tongue & use a tongue scraper from drug store     Follow Up Instructions:        I discussed the assessment and treatment plan with the patient. The patient was provided an opportunity to ask questions and all were answered. The patient agreed with the plan and demonstrated an understanding of the instructions.       The patient was advised to call back or seek an in-person evaluation if the symptoms worsen or if the condition fails to improve as anticipated.    Kirtland Bouchard, MD

## 2022-05-01 ENCOUNTER — Ambulatory Visit (INDEPENDENT_AMBULATORY_CARE_PROVIDER_SITE_OTHER): Payer: Medicare HMO | Admitting: Internal Medicine

## 2022-05-01 ENCOUNTER — Encounter: Payer: Self-pay | Admitting: Internal Medicine

## 2022-05-01 VITALS — BP 160/90 | HR 89 | Temp 97.9°F | Resp 17 | Ht 66.0 in | Wt 221.4 lb

## 2022-05-01 DIAGNOSIS — K133 Hairy leukoplakia: Secondary | ICD-10-CM | POA: Diagnosis not present

## 2022-05-01 DIAGNOSIS — I10 Essential (primary) hypertension: Secondary | ICD-10-CM | POA: Diagnosis not present

## 2022-05-01 DIAGNOSIS — L299 Pruritus, unspecified: Secondary | ICD-10-CM | POA: Diagnosis not present

## 2022-05-01 DIAGNOSIS — Z6835 Body mass index (BMI) 35.0-35.9, adult: Secondary | ICD-10-CM | POA: Diagnosis not present

## 2022-05-01 DIAGNOSIS — E782 Mixed hyperlipidemia: Secondary | ICD-10-CM

## 2022-05-01 MED ORDER — MONTELUKAST SODIUM 10 MG PO TABS: ORAL_TABLET | ORAL | 3 refills | Status: AC

## 2022-05-01 MED ORDER — PHENTERMINE HCL 37.5 MG PO TABS: ORAL_TABLET | ORAL | 1 refills | Status: DC

## 2022-05-01 MED ORDER — TOPIRAMATE 50 MG PO TABS: ORAL_TABLET | ORAL | 1 refills | Status: DC

## 2022-07-04 ENCOUNTER — Ambulatory Visit (INDEPENDENT_AMBULATORY_CARE_PROVIDER_SITE_OTHER): Payer: Medicare HMO | Admitting: Nurse Practitioner

## 2022-07-04 VITALS — BP 140/62 | HR 65 | Temp 97.7°F | Ht 66.0 in | Wt 182.0 lb

## 2022-07-04 DIAGNOSIS — E039 Hypothyroidism, unspecified: Secondary | ICD-10-CM

## 2022-07-04 DIAGNOSIS — E669 Obesity, unspecified: Secondary | ICD-10-CM

## 2022-07-04 DIAGNOSIS — M1000 Idiopathic gout, unspecified site: Secondary | ICD-10-CM

## 2022-07-04 DIAGNOSIS — Z79899 Other long term (current) drug therapy: Secondary | ICD-10-CM

## 2022-07-04 DIAGNOSIS — R7309 Other abnormal glucose: Secondary | ICD-10-CM

## 2022-07-04 DIAGNOSIS — I700 Atherosclerosis of aorta: Secondary | ICD-10-CM | POA: Diagnosis not present

## 2022-07-04 DIAGNOSIS — Z0000 Encounter for general adult medical examination without abnormal findings: Secondary | ICD-10-CM

## 2022-07-04 DIAGNOSIS — D509 Iron deficiency anemia, unspecified: Secondary | ICD-10-CM | POA: Diagnosis not present

## 2022-07-04 DIAGNOSIS — N400 Enlarged prostate without lower urinary tract symptoms: Secondary | ICD-10-CM

## 2022-07-04 DIAGNOSIS — R972 Elevated prostate specific antigen [PSA]: Secondary | ICD-10-CM

## 2022-07-04 DIAGNOSIS — E782 Mixed hyperlipidemia: Secondary | ICD-10-CM | POA: Diagnosis not present

## 2022-07-04 DIAGNOSIS — I10 Essential (primary) hypertension: Secondary | ICD-10-CM

## 2022-07-04 DIAGNOSIS — M353 Polymyalgia rheumatica: Secondary | ICD-10-CM | POA: Diagnosis not present

## 2022-07-04 DIAGNOSIS — E559 Vitamin D deficiency, unspecified: Secondary | ICD-10-CM

## 2022-07-04 DIAGNOSIS — L84 Corns and callosities: Secondary | ICD-10-CM

## 2022-07-04 NOTE — Patient Instructions (Addendum)

## 2022-07-04 NOTE — Progress Notes (Signed)
MEDICARE ANNUAL WELLNESS VISIT AND FOLLOW UP Assessment:   Andrew Cochran was seen today for follow-up and medicare wellness.  Diagnoses and all orders for this visit:  Encounter for Medicare annual wellness exam Due annually Healthcare maintenance reviewed  Essential hypertension Continue medications Discussed DASH (Dietary Approaches to Stop Hypertension) DASH diet is lower in sodium than a typical American diet. Cut back on foods that are high in saturated fat, cholesterol, and trans fats. Eat more whole-grain foods, fish, poultry, and nuts Remain active and exercise as tolerated daily.  Monitor BP at home-Call if greater than 130/80.   Hyperlipidemia, mixed Discussed lifestyle modifications. Recommended diet heavy in fruits and veggies, omega 3's. Decrease consumption of animal meats, cheeses, and dairy products. Remain active and exercise as tolerated. Continue to monitor. Monitor lipids  Abnormal glucose Education: Reviewed 'ABCs' of diabetes management  Discussed goals to be met and/or maintained include A1C (<7) Blood pressure (<130/80) Cholesterol (LDL <70) Continue Eye Exam yearly  Continue Dental Exam Q6 mo Discussed dietary recommendations Discussed Physical Activity recommendations Check A1C  Vitamin D deficiency Continue supplementation to maintain goal of 60-100 Monitor levels  Idiopathic gout, unspecified chronicity, unspecified site Continue allopurinol 330m daily No recent flares Discussed dietary modifications - low purine diet Continue to monitor  Hypothyroidism, unspecified type Controlled. Continue Levothyroxine. Reminded to take on an empty stomach 30-680ms before food.  Stop any Biotin Supplement 48-72 hours before next TSH level to reduce the risk of falsely low TSH levels. Continue to monitor.     BPH with obstruction/lower urinary tract symptoms Controlled Will continue to monitor Defer PSA to CPE  Polymyalgia rheumatica  (HCC) Controlled Dexamethasone 10 mg 3 days a weeks PRN Continue to monitor  Aortic atherosclerosis (HCC) Control blood pressure, lipids and glucose Disscused lifestyle modifications, diet & exercise Continue to monitor  Obesity ([place] appropriate BMI Diet modification. Physical activity. Encouraged/praised to build confidence.  Medication management All medications discussed and reviewed in full. All questions and concerns regarding medications addressed.    Iron deficiency anemia, unspecified iron deficiency anemia type Discussed iron rich foods - lean red meat, green leafy vegetables. Monitor CBC/anemia panel  Orders Placed This Encounter  Procedures   CBC with Differential/Platelet   COMPLETE METABOLIC PANEL WITH GFR   Lipid panel   TSH   Hemoglobin A1c   VITAMIN D 25 Hydroxy (Vit-D Deficiency, Fractures)   Iron, TIBC and Ferritin Panel    Notify office for further evaluation and treatment, questions or concerns if any reported s/s fail to improve.   The patient was advised to call back or seek an in-person evaluation if any symptoms worsen or if the condition fails to improve as anticipated.   Further disposition pending results of labs. Discussed med's effects and SE's.    I discussed the assessment and treatment plan with the patient. The patient was provided an opportunity to ask questions and all were answered. The patient agreed with the plan and demonstrated an understanding of the instructions.  Discussed med's effects and SE's. Screening labs and tests as requested with regular follow-up as recommended.  I provided 35 minutes of face-to-face time during this encounter including counseling, chart review, and critical decision making was preformed.  Future Appointments  Date Time Provider [place]2/27/2024  2:30 PM [facility] NURSE [facility] None  10/05/2022  2:30 PM McUnk PintoMD [facility] None  01/08/2023  2:30 PM CrDarrol JumpNP [facility] None  04/10/2023  2:00 PM McUnk PintoMD [facility] Quint  None  07/10/2023  3:30 PM Aniayah Alaniz, Kenney Houseman, NP [facility] None     Plan:   During the course of the visit the patient was educated and counseled about appropriate screening and preventive services including:   Pneumococcal vaccine  Influenza vaccine Td vaccine Screening electrocardiogram Colorectal cancer screening Diabetes screening Glaucoma screening Nutrition counseling    Subjective:  Andrew Cochran is a 82 y.o. male who presents for Medicare Annual Wellness Visit and 3 month follow up for HTN, hyperlipidemia, prediabetes, and vitamin D Def.   Overall he reports feeling well today.  He has no new or additional concerns in clinic at this time.  He has history of nonfocal TIA in 2008, no sequela. States memory is unchanged, some short term memory issues. No incontinence.   His blood pressure has been controlled at home, today their BP is BP: (!) 140/62 BP Readings from Last 3 Encounters:  07/11/22 122/60  07/04/22 (!) 140/62  05/01/22 (!) 160/90    He does not workout. He denies chest pain, shortness of breath, dizziness.   BMI is Body mass index is 29.38 kg/m., he has not been working on diet and exercise. Wt Readings from Last 3 Encounters:  07/11/22 183 lb 12.8 oz (83.4 kg)  07/04/22 182 lb (82.6 kg)  05/01/22 221 lb 6.4 oz (100.4 kg)   He states his appetite has been good. BMI is obese but weight is trending down.  He has incorporated lifestyle modifications . Denies any night sweats. AB [place] 2012 showed fatty liver. He had colonoscopy 2019. no diarrhea/constipation. No trouble swallowing.  Wt Readings from Last 3 Encounters:  07/11/22 183 lb 12.8 oz (83.4 kg)  07/04/22 182 lb (82.6 kg)  05/01/22 221 lb 6.4 oz (100.4 kg)    He has history of PMR x 2011, has been tapered off steroids, Taking steroids a maximum of 3 days a week.    He is on cholesterol medication, he is on crestor 34m  daily, he is not on zetia and denies myalgias. His cholesterol is not at goal. The cholesterol last visit was:   Lab Results  Component Value Date   CHOL 126 07/04/2022   HDL 30 (L) 07/04/2022   LDLCALC 76 07/04/2022   TRIG 115 07/04/2022   CHOLHDL 4.2 07/04/2022   Last A1C in the office was normal, did have A1C of 6.4 in 2011 but has decreased with exercise/diet:  Lab Results  Component Value Date   HGBA1C 4.7 07/04/2022   Patient is on Vitamin D supplement.   Lab Results  Component Value Date   VD25OH 120 (H) 07/04/2022     He is on thyroid medication. His medication was changed last visit. He is on 200 mcg qd.  Patient denies nervousness, palpitations and weight changes.  Lab Results  Component Value Date   TSH 0.01 (L) 07/11/2022   Patient is on allopurinol for gout and does not report a recent flare. Lab Results  Component Value Date   LABURIC 3.7 (L) 03/27/2022    Medication Review: Current Outpatient Medications on File Prior to Visit  Medication Sig Dispense Refill   allopurinol (ZYLOPRIM) 300 MG tablet TAKE 1 TABLET EVERY DAY  TO  PREVENT  GOUT 90 tablet 3   Ascorbic Acid (VITAMIN C PO) Take 2,000 mg by mouth daily.      atenolol (TENORMIN) 100 MG tablet TAKE 1 TABLET BY MOUTH  DAILY FOR BLOOD PRESSURE 90 tablet 3   Beta Carotene (VITAMIN A) 25000 UNIT  capsule Take 25,000 Units by mouth daily.     Blood Glucose Calibration (ACCU-CHEK SMARTVIEW CONTROL) LIQD Check blood sugar 1 time daily-DX-R73.03 1 each 3   Blood Glucose Monitoring Suppl (ACCU-CHEK AVIVA PLUS) w/Device KIT CHECK BLOOD SUGAR 1 TIME  DAILY 1 kit 99   Cholecalciferol (VITAMIN D3) 125 MCG (5000 UT) CAPS Takes 1 capsule Daily     ezetimibe (ZETIA) 10 MG tablet TAKE 1 TABLET BY MOUTH  DAILY FOR CHOLESTEROL 90 tablet 3   Garlic [id number] MG TBEC Take by mouth.     levothyroxine (SYNTHROID) 200 MCG tablet TAKE 1 TAB DAILY ON AN EMPTY STOMACH WITH ONLY WATER FOR 30 MIN AND NO ANTACID MEDS, CALCIUM, MAGNESIUM FOR  4 HRS. AVOID BIOTIN SYNTHROID TAB 200MCG 90 tablet 3   MAGNESIUM PO Take 250 mg by mouth 3 (three) times daily.      meloxicam (MOBIC) 15 MG tablet Take 1/2 to 1 tablet  Daily  with Food  for Pain & Inflammation 90 tablet 3   montelukast (SINGULAIR) 10 MG tablet Take 1 tablet daily for Allergies & Asthma 90 tablet 3   phentermine (ADIPEX-P) 37.5 MG tablet Take 1/2 to 1 tablet every Morning for Dieting & Weight Loss 90 tablet 1   thiamine (VITAMIN B-1) 50 MG tablet Take 50 mg by mouth daily.     topiramate (TOPAMAX) 50 MG tablet Take 1/2 to 1 tablet 2 x /day at Suppertime & Bedtime for Dieting & Weight Loss 180 tablet 1   vitamin B-6 (PYRIDOXINE) 25 MG tablet Take 25 mg by mouth daily.     No current facility-administered medications on file prior to visit.    Current Problems (verified) Patient Active Problem List   Diagnosis Date Noted   Aortic atherosclerosis ([place]) by Abd U/S in 2012  04/10/2017   Obesity (BMI 30.0-34.9) 07/07/2014   DJD  07/07/2014   Polymyalgia rheumatica ([place]) 07/07/2014   Hx of adenomatous colonic polyps 06/18/2014   Vitamin D deficiency 09/25/2013   Medication management 09/25/2013   Cataracts, bilateral    Gout    Hypertension    Hypothyroidism    BPH with elevated PSA    Hyperlipidemia      Immunization History  Administered Date(s) Administered   Influenza, High Dose Seasonal PF 03/27/2022   Influenza-Unspecified 01/27/2018   PFIZER(Purple Top)SARS-COV-2 Vaccination 07/04/2019, 07/29/2019   Pneumococcal Polysaccharide-23 08/19/2008, 12/23/2020   Td 09/07/2009    Preventative care: Last colonoscopy: 2019 Dr. Carlean Purl, + adenomas 3 years, due 04/2021 - aged out - defers at this time - will monitor any symptoms. [place] AB 2012- had fatty liver   Prior vaccinations: TD or Tdap: 2011  Influenza: declines Pneumococcal: 12/23/20 Prevnar 13: declines Shingles/Zostavax: declines Covid : Pfizer 07/04/19, 07/29/19 No booster  Names of Other  Physician/Practitioners you currently use: 1. [facility] here for primary care 2. Dr. Katy Fitch, eye doctorover due 3. [facility], dentist, 2023  Patient Care Team: Unk Pinto, MD as PCP - General (Internal Medicine) [place], [facility] Eduardo Osier., MD as Attending Physician (Urology) Gatha Mayer, MD as Consulting Physician (Gastroenterology) Clent Jacks, MD as Consulting Physician (Ophthalmology)  Allergies Allergies  Allergen Reactions   Lotensin [Benazepril Hcl]     unknown   Penicillins Rash    SURGICAL HISTORY He  has a past surgical history that includes Cataract extraction (Right, 2009); Shoulder arthroscopy (Right); and Colonoscopy w/ biopsies. FAMILY HISTORY His family history includes COPD in his father;  Cancer in his mother. SOCIAL HISTORY He  reports that he quit smoking about 63 years ago. His smoking use included cigarettes. He has never used smokeless tobacco. He reports that he does not drink alcohol and does not use drugs.  MEDICARE WELLNESS OBJECTIVES: Physical activity:   Cardiac risk factors:   Depression/mood screen:      04/30/2022    9:39 PM  Depression screen PHQ 2/9  Decreased Interest 0  Down, Depressed, Hopeless 0  PHQ - 2 Score 0    ADLs:     04/30/2022    9:40 PM 03/26/2022   10:51 PM  In your present state of health, do you have any difficulty performing the following activities:  Hearing? 0 0  Vision? 0 0  Difficulty concentrating or making decisions? 0 0  Walking or climbing stairs? 0 0  Dressing or bathing? 0 0  Doing errands, shopping? 0 0     Cognitive Testing  Alert? Yes  Normal Appearance?Yes  Oriented to person? Yes  Place? Yes   Time? Yes  Recall of three objects?  2/3  Can perform simple calculations? Yes  Displays appropriate judgment?Yes  Can read the correct time from a watch face?Yes  EOL planning:   He does not have advanced directives, states wife will  make the decision, if she passes will fill them out so 3 kids does not have to make choice   Review of Systems  Constitutional:  Negative for chills, fever and weight loss.  HENT:  Negative for congestion and hearing loss.   Eyes:  Negative for blurred vision and double vision.  Respiratory:  Negative for cough and shortness of breath.   Cardiovascular:  Negative for chest pain, palpitations, orthopnea and leg swelling.  Gastrointestinal:  Negative for abdominal pain, constipation, diarrhea, heartburn, nausea and vomiting.  Musculoskeletal:  Negative for falls, joint pain and myalgias.  Skin:  Negative for rash.       Tender area right side of foot  Neurological:  Negative for dizziness, tingling, tremors, loss of consciousness and headaches.  Psychiatric/Behavioral:  Negative for depression, memory loss and suicidal ideas.      Objective:   Blood pressure (!) 140/62, pulse 65, temperature 97.7 F (36.5 C), height 5' 6"$  (1.676 m), weight 182 lb (82.6 kg), SpO2 99 %. Body mass index is 29.38 kg/m.  General appearance: alert, no distress, WD/WN, male HEENT: normocephalic, sclerae anicteric, TMs pearly, nares patent, no discharge or erythema, pharynx normal Oral cavity: MMM, no lesions Neck: supple, no lymphadenopathy, no thyromegaly, no masses Heart: RRR, normal S1, S2, no murmurs Lungs: CTA bilaterally, no wheezes, rhonchi, or rales Abdomen: +bs, soft, non tender, non distended, no masses, no hepatomegaly, no splenomegaly Musculoskeletal: nontender, no swelling, no obvious deformity Extremities: no edema, no cyanosis, no clubbing Pulses: 2+ symmetric, upper and lower extremities, normal cap refill Neurological: alert, oriented x 3, CN2-12 intact, strength normal upper extremities and lower extremities, sensation normal throughout, DTRs 2+ throughout, no cerebellar signs, gait normal Skin: Warm and dry. Small callus noted on lateral side of right foot, no erythema or signs of  infection Psychiatric: normal affect, behavior normal, pleasant      Medicare Attestation I have personally reviewed: The patient's medical and social history Their use of alcohol, tobacco or illicit drugs Their current medications and supplements The patient's functional ability including ADLs,fall risks, home safety risks, cognitive, and hearing and visual impairment Diet and physical activities Evidence for depression or mood disorders  The patient's  weight, height, BMI, and visual acuity have been recorded in the chart.  I have made referrals, counseling, and provided education to the patient based on review of the above and I have provided the patient with a written personalized care plan for preventive services.    Magda Bernheim ANP-C  [facility]  07/17/2022

## 2022-07-05 LAB — TSH: TSH: 0.01 mIU/L — ABNORMAL LOW (ref 0.40–4.50)

## 2022-07-05 LAB — LIPID PANEL
Cholesterol: 126 mg/dL (ref <200)
HDL: 30 mg/dL — ABNORMAL LOW (ref >=40)
LDL Cholesterol (Calc): 76 mg/dL (calc)
Non-HDL Cholesterol (Calc): 96 mg/dL (calc) (ref <130)
Total CHOL/HDL Ratio: 4.2 (calc) (ref <5.0)
Triglycerides: 115 mg/dL (ref <150)

## 2022-07-05 LAB — CBC WITH DIFFERENTIAL/PLATELET
Absolute Monocytes: 346 cells/uL (ref 200–950)
Basophils Absolute: 11 cells/uL (ref 0–200)
Basophils Relative: 0.3 %
Eosinophils Absolute: 103 cells/uL (ref 15–500)
Eosinophils Relative: 2.7 %
HCT: 34.3 % — ABNORMAL LOW (ref 38.5–50.0)
Hemoglobin: 11.9 g/dL — ABNORMAL LOW (ref 13.2–17.1)
Lymphs Abs: 1106 cells/uL (ref 850–3900)
MCH: 30.8 pg (ref 27.0–33.0)
MCHC: 34.7 g/dL (ref 32.0–36.0)
MCV: 88.9 fL (ref 80.0–100.0)
MPV: 11.5 fL (ref 7.5–12.5)
Monocytes Relative: 9.1 %
Neutro Abs: 2234 cells/uL (ref 1500–7800)
Neutrophils Relative %: 58.8 %
Platelets: 205 10*3/uL (ref 140–400)
RBC: 3.86 10*6/uL — ABNORMAL LOW (ref 4.20–5.80)
RDW: 13.7 % (ref 11.0–15.0)
Total Lymphocyte: 29.1 %
WBC: 3.8 10*3/uL (ref 3.8–10.8)

## 2022-07-05 LAB — HEMOGLOBIN A1C
Hgb A1c MFr Bld: 4.7 % of total Hgb (ref <5.7)
Mean Plasma Glucose: 88 mg/dL
eAG (mmol/L): 4.9 mmol/L

## 2022-07-05 LAB — COMPLETE METABOLIC PANEL WITH GFR
AG Ratio: 1.3 (calc) (ref 1.0–2.5)
ALT: 35 U/L (ref 9–46)
AST: 42 U/L — ABNORMAL HIGH (ref 10–35)
Albumin: 3.5 g/dL — ABNORMAL LOW (ref 3.6–5.1)
Alkaline phosphatase (APISO): 76 U/L (ref 35–144)
BUN: 17 mg/dL (ref 7–25)
CO2: 32 mmol/L (ref 20–32)
Calcium: 9.7 mg/dL (ref 8.6–10.3)
Chloride: 105 mmol/L (ref 98–110)
Creat: 0.81 mg/dL (ref 0.70–1.28)
Globulin: 2.8 g/dL (calc) (ref 1.9–3.7)
Glucose, Bld: 101 mg/dL — ABNORMAL HIGH (ref 65–99)
Potassium: 4.5 mmol/L (ref 3.5–5.3)
Sodium: 143 mmol/L (ref 135–146)
Total Bilirubin: 0.7 mg/dL (ref 0.2–1.2)
Total Protein: 6.3 g/dL (ref 6.1–8.1)
eGFR: 90 mL/min/{1.73_m2} (ref >=60)

## 2022-07-05 LAB — VITAMIN D 25 HYDROXY (VIT D DEFICIENCY, FRACTURES): Vit D, 25-Hydroxy: 120 ng/mL — ABNORMAL HIGH (ref 30–100)

## 2022-07-05 LAB — IRON,TIBC AND FERRITIN PANEL
%SAT: 40 % (calc) (ref 20–48)
Ferritin: 470 ng/mL — ABNORMAL HIGH (ref 24–380)
Iron: 76 ug/dL (ref 50–180)
TIBC: 190 mcg/dL (calc) — ABNORMAL LOW (ref 250–425)

## 2022-07-06 ENCOUNTER — Other Ambulatory Visit: Payer: Self-pay | Admitting: Nurse Practitioner

## 2022-07-06 DIAGNOSIS — E039 Hypothyroidism, unspecified: Secondary | ICD-10-CM

## 2022-07-06 DIAGNOSIS — D649 Anemia, unspecified: Secondary | ICD-10-CM

## 2022-07-06 DIAGNOSIS — R7989 Other specified abnormal findings of blood chemistry: Secondary | ICD-10-CM

## 2022-07-11 ENCOUNTER — Ambulatory Visit (INDEPENDENT_AMBULATORY_CARE_PROVIDER_SITE_OTHER): Payer: Medicare HMO

## 2022-07-11 VITALS — BP 122/60 | HR 61 | Temp 97.7°F | Ht 66.0 in | Wt 183.8 lb

## 2022-07-11 DIAGNOSIS — R7989 Other specified abnormal findings of blood chemistry: Secondary | ICD-10-CM | POA: Diagnosis not present

## 2022-07-11 DIAGNOSIS — Z79899 Other long term (current) drug therapy: Secondary | ICD-10-CM

## 2022-07-11 DIAGNOSIS — E039 Hypothyroidism, unspecified: Secondary | ICD-10-CM | POA: Diagnosis not present

## 2022-07-11 DIAGNOSIS — D649 Anemia, unspecified: Secondary | ICD-10-CM | POA: Diagnosis not present

## 2022-07-11 NOTE — Progress Notes (Signed)
Patient presents to the office to have labs done. Patient is questioning if there is anything he can do to get to sleep, hasn't been able to for a week.

## 2022-07-12 ENCOUNTER — Other Ambulatory Visit: Payer: Self-pay | Admitting: Nurse Practitioner

## 2022-07-12 DIAGNOSIS — Z79899 Other long term (current) drug therapy: Secondary | ICD-10-CM

## 2022-07-12 DIAGNOSIS — R7989 Other specified abnormal findings of blood chemistry: Secondary | ICD-10-CM

## 2022-07-12 DIAGNOSIS — E039 Hypothyroidism, unspecified: Secondary | ICD-10-CM

## 2022-07-12 LAB — CBC WITH DIFFERENTIAL/PLATELET
Absolute Monocytes: 403 cells/uL (ref 200–950)
Basophils Absolute: 29 cells/uL (ref 0–200)
Basophils Relative: 0.7 %
Eosinophils Absolute: 80 cells/uL (ref 15–500)
Eosinophils Relative: 1.9 %
HCT: 33 % — ABNORMAL LOW (ref 38.5–50.0)
Hemoglobin: 11.3 g/dL — ABNORMAL LOW (ref 13.2–17.1)
Lymphs Abs: 974 cells/uL (ref 850–3900)
MCH: 31.4 pg (ref 27.0–33.0)
MCHC: 34.2 g/dL (ref 32.0–36.0)
MCV: 91.7 fL (ref 80.0–100.0)
MPV: 11.2 fL (ref 7.5–12.5)
Monocytes Relative: 9.6 %
Neutro Abs: 2713 cells/uL (ref 1500–7800)
Neutrophils Relative %: 64.6 %
Platelets: 195 10*3/uL (ref 140–400)
RBC: 3.6 10*6/uL — ABNORMAL LOW (ref 4.20–5.80)
RDW: 14.2 % (ref 11.0–15.0)
Total Lymphocyte: 23.2 %
WBC: 4.2 10*3/uL (ref 3.8–10.8)

## 2022-07-12 LAB — TSH: TSH: 0.01 mIU/L — ABNORMAL LOW (ref 0.40–4.50)

## 2022-07-12 LAB — IRON,TIBC AND FERRITIN PANEL
%SAT: 45 % (calc) (ref 20–48)
Ferritin: 326 ng/mL (ref 24–380)
Iron: 82 ug/dL (ref 50–180)
TIBC: 184 mcg/dL (calc) — ABNORMAL LOW (ref 250–425)

## 2022-07-17 ENCOUNTER — Telehealth: Payer: Self-pay | Admitting: Nurse Practitioner

## 2022-07-17 NOTE — Telephone Encounter (Signed)
Patient aware.

## 2022-07-17 NOTE — Telephone Encounter (Signed)
PT SAID THAT WHEN HE TALKED TO TONYA LAST WEEK THAT HE NEEDED A SLEEPING PILL AND TONYA SAID SHE WOULD SEND ONE IN AND HE HASN'T SEEN IT YET AT HIS PHARM-PLEASE SEND IN MED AND CALL PT BACK AT [phone] AND PHARM IS [facility] AT [facility]

## 2022-07-18 ENCOUNTER — Telehealth: Payer: Self-pay | Admitting: Nurse Practitioner

## 2022-07-18 NOTE — Telephone Encounter (Signed)
Patient states that the last time he was in, you recommended a vitamin that he should be taking but he cannot remember what it was.Andrew KitchenMarland Cochran

## 2022-07-20 NOTE — Telephone Encounter (Signed)
Patient aware.

## 2022-07-25 ENCOUNTER — Ambulatory Visit (INDEPENDENT_AMBULATORY_CARE_PROVIDER_SITE_OTHER): Payer: Medicare PPO

## 2022-07-25 DIAGNOSIS — E039 Hypothyroidism, unspecified: Secondary | ICD-10-CM | POA: Diagnosis not present

## 2022-07-25 DIAGNOSIS — Z79899 Other long term (current) drug therapy: Secondary | ICD-10-CM | POA: Diagnosis not present

## 2022-07-25 DIAGNOSIS — R7989 Other specified abnormal findings of blood chemistry: Secondary | ICD-10-CM | POA: Diagnosis not present

## 2022-07-25 NOTE — Progress Notes (Signed)
Patient presents to the office for a nurse visit to have labs done to check TSH levels. Patient states that he is taking his Levothyroxine 22mg, every other day.

## 2022-07-26 ENCOUNTER — Other Ambulatory Visit: Payer: Self-pay | Admitting: Nurse Practitioner

## 2022-07-26 DIAGNOSIS — Z79899 Other long term (current) drug therapy: Secondary | ICD-10-CM

## 2022-07-26 DIAGNOSIS — E039 Hypothyroidism, unspecified: Secondary | ICD-10-CM

## 2022-07-26 LAB — TSH: TSH: 0.01 mIU/L — ABNORMAL LOW (ref 0.40–4.50)

## 2022-08-09 ENCOUNTER — Ambulatory Visit (INDEPENDENT_AMBULATORY_CARE_PROVIDER_SITE_OTHER): Payer: Medicare PPO

## 2022-08-09 DIAGNOSIS — E039 Hypothyroidism, unspecified: Secondary | ICD-10-CM | POA: Diagnosis not present

## 2022-08-09 DIAGNOSIS — Z79899 Other long term (current) drug therapy: Secondary | ICD-10-CM

## 2022-08-09 NOTE — Progress Notes (Signed)
Patient here for a Nurse Visit to recheck his TSH. He states he is taking his Levothyroxine 200 mcg MWF.

## 2022-08-10 ENCOUNTER — Other Ambulatory Visit: Payer: Self-pay | Admitting: Nurse Practitioner

## 2022-08-10 DIAGNOSIS — E039 Hypothyroidism, unspecified: Secondary | ICD-10-CM

## 2022-08-10 DIAGNOSIS — Z79899 Other long term (current) drug therapy: Secondary | ICD-10-CM

## 2022-08-10 DIAGNOSIS — R7989 Other specified abnormal findings of blood chemistry: Secondary | ICD-10-CM

## 2022-08-10 LAB — TSH: TSH: 0.08 mIU/L — ABNORMAL LOW (ref 0.40–4.50)

## 2022-08-23 ENCOUNTER — Ambulatory Visit (INDEPENDENT_AMBULATORY_CARE_PROVIDER_SITE_OTHER): Payer: Medicare PPO

## 2022-08-23 DIAGNOSIS — R7989 Other specified abnormal findings of blood chemistry: Secondary | ICD-10-CM

## 2022-08-23 DIAGNOSIS — E039 Hypothyroidism, unspecified: Secondary | ICD-10-CM

## 2022-08-23 DIAGNOSIS — Z79899 Other long term (current) drug therapy: Secondary | ICD-10-CM | POA: Diagnosis not present

## 2022-08-23 NOTE — Progress Notes (Signed)
Patient presents today for a Nurse Visit to recheck TSH. He is taking his medication 4 days a week.

## 2022-08-24 LAB — TSH: TSH: 1.27 mIU/L (ref 0.40–4.50)

## 2022-10-04 NOTE — Progress Notes (Signed)
N  O        S  H  O  W  Future Appointments  Date Time Provider Department  10/05/2022                         6 mo ov  2:30 PM Lucky Cowboy, MD [facility]  01/08/2023                       9 mo ov   2:30 PM Adela Glimpse, NP [facility]  04/10/2023                      cpe  2:00 PM Lucky Cowboy, MD [facility]  07/10/2023                       wellness  3:30 PM Adela Glimpse, NP [facility]    History of Present Illness:      This very nice 82 y.o. MWM presents for 6  month follow up with HTN, HLD, Pre-Diabetes, Hypothyroidism  and Vitamin D Deficiency. Abd U/S  in 2012 demonstrated Aortic Atherosclerosis.  Patient has hx/o chronic pain syndrome  from DJD managed by low dose steroids  in the past which he has been advised to stop .  Patient has Gout controlled on his meds .         Patient is treated for HTN  since  & BP has been controlled at home. Today's  . Patient has had no complaints of any cardiac type chest pain, palpitations, dyspnea / orthopnea / PND, dizziness, claudication, or dependent edema.        Hyperlipidemia is controlled with diet & meds. Patient denies myalgias or other med SE's. Last Lipids were  Lab Results  Component Value Date   CHOL 126 07/04/2022   HDL 30 (L) 07/04/2022   LDLCALC 76 07/04/2022   TRIG 115 07/04/2022   CHOLHDL 4.2 07/04/2022      Also, the patient has history of PreDiabetes and has had no symptoms of reactive hypoglycemia, diabetic polys, paresthesias or visual blurring.  Last A1c was   Lab Results  Component Value Date   HGBA1C 4.7 07/04/2022         Further, the patient also has history of Vitamin D Deficiency and supplements vitamin D . Last vitamin D was  Lab Results  Component Value Date   VD25OH 120 (H) 07/04/2022      Current Outpatient Medications on File Prior to Visit  Medication Sig   allopurinol (300 MG tablet TAKE 1 TABLET EVERY DAY  TO  PREVENT  GOUT   VITAMIN C) 2,000 mg  Take  daily.    atenolol 100 MG tablet TAKE 1 TABLET   DAILY    Beta Carotene (VIT A) 25000 u Take 25,000 Units  daily.   VITAMIN D 5000 u Takes 1 capsule Daily   ezetimibe  10 MG tablet TAKE 1 TABLET DAILY    Garlic 2000 MG TBEC Take as desired    levothyroxine 200 MCG tablet TAKE 1 TAB DAILY    MAGNESIUM 250 mg  Take 3  times daily.    meloxicam 15 MG tablet Take 1/2 to 1 tablet  Daily     montelukast 10 MG tablet Take 1 tablet daily    phentermine (37.5 MG tablet Take 1/2 to 1 tablet every Morning   thiamine (VIT  B-1) 50 MG tablet Take 5 daily.   topiramate (TOPAMAX) 50 MG tablet Take 1/2 to 1 tablet 2 x /day  vitamin B-6 ( 25 MG tablet Take daily.     Allergies  Allergen Reactions   Lotensin [Benazepril Hcl]     unknown   Penicillins Rash       PMHx:   Past Medical History:  Diagnosis Date   BPH with elevated PSA    Cataracts, bilateral    Diverticulosis    Gout    Hx of adenomatous colonic polyps 06/18/2014   Hyperlipidemia    Hypertension    Prediabetes    Thyroid disease    Tubular adenoma 02/17/2003   Umbilical hernia 04/21/2014      Immunization History  Administered Date(s) Administered   Influenza, High Dose  03/27/2022   Influenza  01/27/2018   PFIZER SARS-COV-2 Vacc 07/04/2019, 07/29/2019   Pneumococcal -23 08/19/2008, 12/23/2020   Td 09/07/2009     Past Surgical History:  Procedure Laterality Date   CATARACT EXTRACTION Right 2009   COLONOSCOPY W/ BIOPSIES     SHOULDER ARTHROSCOPY Right    1979     FHx:    Reviewed / unchanged   SHx:    Reviewed / unchanged    Systems Review:  Constitutional: Denies fever, chills, wt changes, headaches, insomnia, fatigue, night sweats, change in appetite. Eyes: Denies redness, blurred vision, diplopia, discharge, itchy, watery eyes.  ENT: Denies discharge, congestion, post nasal drip, epistaxis, sore throat, earache, hearing loss, dental pain, tinnitus, vertigo, sinus pain, snoring.  CV: Denies chest pain, palpitations, irregular heartbeat, syncope, dyspnea, diaphoresis, orthopnea, PND, claudication or edema. Respiratory: denies cough, dyspnea, DOE, pleurisy, hoarseness, laryngitis, wheezing.  Gastrointestinal: Denies dysphagia, odynophagia, heartburn, reflux, water brash, abdominal pain or cramps, nausea, vomiting, bloating, diarrhea, constipation, hematemesis, melena, hematochezia  or hemorrhoids. Genitourinary: Denies dysuria, frequency, urgency, nocturia, hesitancy, discharge, hematuria or flank pain. Musculoskeletal: Denies arthralgias, myalgias, stiffness, jt. swelling, pain, limping or strain/sprain.  Skin: Denies pruritus, rash, hives, warts, acne, eczema or change in skin lesion(s). Neuro: No weakness, tremor, incoordination, spasms,  paresthesia or pain. Psychiatric: Denies confusion, memory loss or sensory loss. Endo: Denies change in weight, skin or hair change.  Heme/Lymph: No excessive bleeding, bruising or enlarged lymph nodes.   Physical Exam  There were no vitals taken for this visit.  Appears  well nourished, well groomed  and in no distress.  Eyes: PERRLA, EOMs, conjunctiva no swelling or erythema. Sinuses: No frontal/maxillary tenderness ENT/Mouth: EAC's clear, TM's nl w/o erythema, bulging. Nares clear w/o erythema, swelling, exudates. Oropharynx clear without erythema or exudates. Oral hygiene is good. Tongue normal, non obstructing. Hearing intact.  Neck: Supple. Thyroid not palpable. Car 2+/2+ without bruits, nodes or JVD. Chest: Respirations nl with BS clear & equal w/o rales, rhonchi, wheezing or stridor.  Cor: Heart sounds normal w/ regular rate and rhythm without sig. murmurs, gallops, clicks or rubs. Peripheral pulses normal and equal  without edema.  Abdomen: Soft & bowel sounds normal. Non-tender w/o guarding, rebound, hernias, masses or organomegaly.  Lymphatics: Unremarkable.  Musculoskeletal: Full ROM all peripheral extremities, joint stability, 5/5 strength and normal gait.  Skin: Warm, dry without exposed rashes, lesions or ecchymosis apparent.  Neuro: Cranial nerves intact, reflexes equal bilaterally. Sensory-motor testing grossly intact. Tendon reflexes grossly intact.  Pysch: Alert & oriented x 3.  Insight and judgement nl & appropriate. No ideations.   Assessment and Plan:  - Continue medication, monitor blood pressure at home.  - Continue DASH diet.  Reminder to go to the ER if any CP,  SOB, nausea, dizziness, severe HA, changes vision/speech.  -  Continue diet/meds, exercise,& lifestyle modifications.  - Continue monitor periodic cholesterol/liver & renal functions    - Continue diet, exercise  - Lifestyle modifications.  - Monitor appropriate labs. - Continue  supplementation.        Discussed  regular exercise, BP monitoring, weight control to achieve/maintain BMI less than 25 and discussed med and SE's. Recommended labs to assess /monitor clinical status .  I discussed the assessment and treatment plan with the patient. The patient was provided an opportunity to ask questions and all were answered. The patient agreed with the plan and demonstrated an understanding of the instructions.  I provided over 30 minutes of exam, counseling, chart review and  complex critical decision making.        The patient was advised to call back or seek an in-person evaluation if the symptoms worsen or if the condition fails to improve as anticipated.   Marinus Maw, MD

## 2022-10-04 NOTE — Progress Notes (Incomplete)
Future Appointments  Date Time Provider Department  10/05/2022                         6 mo ov  2:30 PM Lucky Cowboy, MD [facility]  01/08/2023                       9 mo ov   2:30 PM Adela Glimpse, NP [facility]  04/10/2023                      cpe  2:00 PM Lucky Cowboy, MD [facility]  07/10/2023                       wellness  3:30 PM Adela Glimpse, NP [facility]    History of Present Illness:      This very nice 82 y.o. MWM presents for 6  month follow up with HTN, HLD, Pre-Diabetes, Hypothyroidism  and Vitamin D Deficiency. Abd U/S  in 2012 demonstrated Aortic Atherosclerosis.  Patient has hx/o chronic pain syndrome  from DJD managed by low dose steroids  in the past which he has been advised to stop . Patient has Gout controlled on his meds .         Patient is treated for HTN  since  & BP has been controlled at home. Today's  . Patient has had no complaints of any cardiac type chest pain, palpitations, dyspnea / orthopnea / PND, dizziness, claudication, or dependent edema.        Hyperlipidemia is controlled with diet & meds. Patient denies myalgias or other med SE's. Last Lipids were  Lab Results  Component Value Date   CHOL 126 07/04/2022   HDL 30 (L) 07/04/2022   LDLCALC 76 07/04/2022   TRIG 115 07/04/2022   CHOLHDL 4.2 07/04/2022      Also, the patient has history of T2_NIDDM PreDiabetes and has had no symptoms of reactive hypoglycemia, diabetic polys, paresthesias or visual blurring.  Last A1c was   Lab Results  Component Value Date   HGBA1C 4.7 07/04/2022         Further, the patient also has history of Vitamin D Deficiency and supplements vitamin D . Last vitamin D was  Lab Results  Component Value Date   VD25OH 120 (H) 07/04/2022      Current Outpatient Medications on File Prior to Visit  Medication Sig  . allopurinol (300 MG tablet TAKE 1 TABLET EVERY DAY  TO  PREVENT  GOUT  . VITAMIN C) 2,000 mg  Take  daily.   Marland Kitchen atenolol 100 MG  tablet TAKE 1 TABLET   DAILY   . Beta Carotene (VIT A) 25000 u Take 25,000 Units  daily.  Marland Kitchen VITAMIN D 5000 u Takes 1 capsule Daily  . ezetimibe  10 MG tablet TAKE 1 TABLET DAILY   . Garlic 2000 MG TBEC Take as desired   . levothyroxine 200 MCG tablet TAKE 1 TAB DAILY   . MAGNESIUM 250 mg  Take 3  times daily.   . meloxicam 15 MG tablet Take 1/2 to 1 tablet  Daily    . montelukast 10 MG tablet Take 1 tablet daily   . phentermine (37.5 MG tablet Take 1/2 to 1 tablet every Morning  . thiamine (VIT  B-1) 50 MG tablet Take 5 daily.  Marland Kitchen topiramate (TOPAMAX) 50 MG tablet Take 1/2 to 1 tablet  2 x /day  . vitamin B-6 ( 25 MG tablet Take daily.     Allergies  Allergen Reactions  . Lotensin [Benazepril Hcl]     unknown  . Penicillins Rash      PMHx:   Past Medical History:  Diagnosis Date  . BPH with elevated PSA   . Cataracts, bilateral   . Diverticulosis   . Gout   . Hx of adenomatous colonic polyps 06/18/2014  . Hyperlipidemia   . Hypertension   . Prediabetes   . Thyroid disease   . Tubular adenoma 02/17/2003  . Umbilical hernia 04/21/2014      Immunization History  Administered Date(s) Administered  . Influenza, High Dose  03/27/2022  . Influenza  01/27/2018  . PFIZER SARS-COV-2 Vacc 07/04/2019, 07/29/2019  . Pneumococcal -23 08/19/2008, 12/23/2020  . Td 09/07/2009     Past Surgical History:  Procedure Laterality Date  . CATARACT EXTRACTION Right 2009  . COLONOSCOPY W/ BIOPSIES    . SHOULDER ARTHROSCOPY Right    1979     FHx:    Reviewed / unchanged   SHx:    Reviewed / unchanged    Systems Review:  Constitutional: Denies fever, chills, wt changes, headaches, insomnia, fatigue, night sweats, change in appetite. Eyes: Denies redness, blurred vision, diplopia, discharge, itchy, watery eyes.  ENT: Denies discharge, congestion, post nasal drip, epistaxis, sore throat, earache, hearing loss, dental pain, tinnitus, vertigo, sinus pain, snoring.  CV: Denies  chest pain, palpitations, irregular heartbeat, syncope, dyspnea, diaphoresis, orthopnea, PND, claudication or edema. Respiratory: denies cough, dyspnea, DOE, pleurisy, hoarseness, laryngitis, wheezing.  Gastrointestinal: Denies dysphagia, odynophagia, heartburn, reflux, water brash, abdominal pain or cramps, nausea, vomiting, bloating, diarrhea, constipation, hematemesis, melena, hematochezia  or hemorrhoids. Genitourinary: Denies dysuria, frequency, urgency, nocturia, hesitancy, discharge, hematuria or flank pain. Musculoskeletal: Denies arthralgias, myalgias, stiffness, jt. swelling, pain, limping or strain/sprain.  Skin: Denies pruritus, rash, hives, warts, acne, eczema or change in skin lesion(s). Neuro: No weakness, tremor, incoordination, spasms, paresthesia or pain. Psychiatric: Denies confusion, memory loss or sensory loss. Endo: Denies change in weight, skin or hair change.  Heme/Lymph: No excessive bleeding, bruising or enlarged lymph nodes.   Physical Exam  There were no vitals taken for this visit.  Appears  well nourished, well groomed  and in no distress.  Eyes: PERRLA, EOMs, conjunctiva no swelling or erythema. Sinuses: No frontal/maxillary tenderness ENT/Mouth: EAC's clear, TM's nl w/o erythema, bulging. Nares clear w/o erythema, swelling, exudates. Oropharynx clear without erythema or exudates. Oral hygiene is good. Tongue normal, non obstructing. Hearing intact.  Neck: Supple. Thyroid not palpable. Car 2+/2+ without bruits, nodes or JVD. Chest: Respirations nl with BS clear & equal w/o rales, rhonchi, wheezing or stridor.  Cor: Heart sounds normal w/ regular rate and rhythm without sig. murmurs, gallops, clicks or rubs. Peripheral pulses normal and equal  without edema.  Abdomen: Soft & bowel sounds normal. Non-tender w/o guarding, rebound, hernias, masses or organomegaly.  Lymphatics: Unremarkable.  Musculoskeletal: Full ROM all peripheral extremities, joint stability,  5/5 strength and normal gait.  Skin: Warm, dry without exposed rashes, lesions or ecchymosis apparent.  Neuro: Cranial nerves intact, reflexes equal bilaterally. Sensory-motor testing grossly intact. Tendon reflexes grossly intact.  Pysch: Alert & oriented x 3.  Insight and judgement nl & appropriate. No ideations.   Assessment and Plan:  - Continue medication, monitor blood pressure at home.  - Continue DASH diet.  Reminder to go to the ER if any CP,  SOB, nausea, dizziness,  severe HA, changes vision/speech.  - Continue diet/meds, exercise,& lifestyle modifications.  - Continue monitor periodic cholesterol/liver & renal functions    - Continue diet, exercise  - Lifestyle modifications.  - Monitor appropriate labs. - Continue supplementation.        Discussed  regular exercise, BP monitoring, weight control to achieve/maintain BMI less than 25 and discussed med and SE's. Recommended labs to assess /monitor clinical status .  I discussed the assessment and treatment plan with the patient. The patient was provided an opportunity to ask questions and all were answered. The patient agreed with the plan and demonstrated an understanding of the instructions.  I provided over 30 minutes of exam, counseling, chart review and  complex critical decision making.        The patient was advised to call back or seek an in-person evaluation if the symptoms worsen or if the condition fails to improve as anticipated.   Marinus Maw, MD

## 2022-10-05 ENCOUNTER — Encounter: Payer: Self-pay | Admitting: Internal Medicine

## 2022-10-05 ENCOUNTER — Ambulatory Visit: Payer: Medicare PPO | Admitting: Internal Medicine

## 2022-10-05 DIAGNOSIS — I10 Essential (primary) hypertension: Secondary | ICD-10-CM

## 2022-10-05 DIAGNOSIS — E039 Hypothyroidism, unspecified: Secondary | ICD-10-CM

## 2022-10-05 DIAGNOSIS — I700 Atherosclerosis of aorta: Secondary | ICD-10-CM

## 2022-10-05 DIAGNOSIS — E782 Mixed hyperlipidemia: Secondary | ICD-10-CM

## 2022-10-05 DIAGNOSIS — R7309 Other abnormal glucose: Secondary | ICD-10-CM

## 2022-10-05 DIAGNOSIS — E559 Vitamin D deficiency, unspecified: Secondary | ICD-10-CM

## 2022-10-28 ENCOUNTER — Other Ambulatory Visit: Payer: Self-pay | Admitting: Nurse Practitioner

## 2022-10-28 DIAGNOSIS — E039 Hypothyroidism, unspecified: Secondary | ICD-10-CM

## 2022-10-28 DIAGNOSIS — E782 Mixed hyperlipidemia: Secondary | ICD-10-CM

## 2022-10-28 DIAGNOSIS — I10 Essential (primary) hypertension: Secondary | ICD-10-CM

## 2022-10-28 DIAGNOSIS — M1000 Idiopathic gout, unspecified site: Secondary | ICD-10-CM

## 2022-11-21 ENCOUNTER — Ambulatory Visit (INDEPENDENT_AMBULATORY_CARE_PROVIDER_SITE_OTHER): Payer: Medicare HMO | Admitting: Internal Medicine

## 2022-11-21 ENCOUNTER — Encounter: Payer: Self-pay | Admitting: Internal Medicine

## 2022-11-21 VITALS — BP 136/76 | HR 93 | Temp 97.9°F | Resp 16 | Ht 66.0 in | Wt 200.8 lb

## 2022-11-21 DIAGNOSIS — I700 Atherosclerosis of aorta: Secondary | ICD-10-CM

## 2022-11-21 DIAGNOSIS — E039 Hypothyroidism, unspecified: Secondary | ICD-10-CM | POA: Diagnosis not present

## 2022-11-21 DIAGNOSIS — E782 Mixed hyperlipidemia: Secondary | ICD-10-CM | POA: Diagnosis not present

## 2022-11-21 DIAGNOSIS — E559 Vitamin D deficiency, unspecified: Secondary | ICD-10-CM

## 2022-11-21 DIAGNOSIS — R7309 Other abnormal glucose: Secondary | ICD-10-CM

## 2022-11-21 DIAGNOSIS — I10 Essential (primary) hypertension: Secondary | ICD-10-CM

## 2022-11-21 DIAGNOSIS — M1000 Idiopathic gout, unspecified site: Secondary | ICD-10-CM | POA: Diagnosis not present

## 2022-11-21 DIAGNOSIS — Z79899 Other long term (current) drug therapy: Secondary | ICD-10-CM | POA: Diagnosis not present

## 2022-11-21 DIAGNOSIS — Z6835 Body mass index (BMI) 35.0-35.9, adult: Secondary | ICD-10-CM

## 2022-11-21 LAB — CBC WITH DIFFERENTIAL/PLATELET
Absolute Monocytes: 383 cells/uL (ref 200–950)
Basophils Absolute: 41 cells/uL (ref 0–200)
Eosinophils Absolute: 148 cells/uL (ref 15–500)
MCHC: 34.6 g/dL (ref 32.0–36.0)
MPV: 10.3 fL (ref 7.5–12.5)
Neutro Abs: 3279 cells/uL (ref 1500–7800)
WBC: 5.1 10*3/uL (ref 3.8–10.8)

## 2022-11-21 MED ORDER — PHENTERMINE HCL 37.5 MG PO TABS: ORAL_TABLET | ORAL | 1 refills | Status: DC

## 2022-11-21 MED ORDER — ALLOPURINOL 300 MG PO TABS
ORAL_TABLET | ORAL | 3 refills | Status: AC
Start: 2022-11-21 — End: ?

## 2022-11-21 MED ORDER — ATENOLOL 100 MG PO TABS
ORAL_TABLET | ORAL | 3 refills | Status: AC
Start: 2022-11-21 — End: ?

## 2022-11-21 MED ORDER — TOPIRAMATE 50 MG PO TABS
ORAL_TABLET | ORAL | 3 refills | Status: AC
Start: 2022-11-21 — End: ?

## 2022-11-21 NOTE — Patient Instructions (Signed)

## 2022-11-21 NOTE — Progress Notes (Unsigned)
Future Appointments  Date Time Provider Department  11/21/2022                          6 mo   ov  2:30 PM Lucky Cowboy, MD [facility]  01/08/2023                           9 mo ov  2:30 PM Adela Glimpse, NP [facility]  04/10/2023                          cpe  2:00 PM Lucky Cowboy, MD [facility]  07/10/2023                           wellness   3:30 PM Adela Glimpse, NP [facility]    History of Present Illness:      This very nice 82 y.o. MWM presents for 6  month follow up with HTN, HLD, Pre-Diabetes, Hypothyroidism  and Vitamin D Deficiency. Abd U/S  in 2012 demonstrated Aortic Atherosclerosis.  Patient has hx/o chronic pain syndrome  from DJD managed by low dose steroids  in the past which he had been advised to stop . Patient has Gout controlled on his meds .        Patient is treated for HTN  (1997)   & BP has been controlled at home. Today's BP is at goal -  136/76. Patient has had no complaints of any cardiac type chest pain, palpitations, dyspnea / orthopnea / PND, dizziness, claudication, or dependent edema.        Hyperlipidemia is controlled with diet &  Rosuvastatin. Patient denies myalgias or other med SE's. Last Lipids were at goal :  Lab Results  Component Value Date   CHOL 126 07/04/2022   HDL 30 (L) 07/04/2022   LDLCALC 76 07/04/2022   TRIG 115 07/04/2022   CHOLHDL 4.2 07/04/2022     Also, the patient has history of  moderate Obesity  (BMI 34+)  and consequent  PreDiabetes  (A1c 6.4% /2011) . Patient has had no symptoms of reactive hypoglycemia, diabetic polys, paresthesias or visual blurring.  Last A1c was at goal :  Lab Results  Component Value Date   HGBA1C 4.7 07/04/2022                                                       Patient has been on thyroid replacement sincewas dx'd Hypothyroid in 2002 .                                                    Further, the patient also has history of Vitamin D Deficiency  ("36" /2008)  and supplements  vitamin D . Last vitamin D was elevated & dose was decreased :  Lab Results  Component Value Date   VD25OH 120 (H) 07/04/2022     Current Outpatient Medications on File Prior to Visit  Medication Sig   allopurinol (300 MG tablet TAKE 1 TABLET EVERY DAY  TO  PREVENT  GOUT   VITAMIN C) 2,000 mg  Take  daily.    atenolol 100 MG tablet TAKE 1 TABLET   DAILY    Beta Carotene (VIT A) 25000 u Take 25,000 Units  daily.   VITAMIN D 5000 u Takes 1 capsule Daily   ezetimibe  10 MG tablet TAKE 1 TABLET DAILY    Garlic 2000 MG TBEC Take as desired    levothyroxine 200 MCG tablet TAKE 1 TAB DAILY    MAGNESIUM 250 mg  Take 3  times daily.    meloxicam 15 MG tablet Take 1/2 to 1 tablet  Daily     montelukast 10 MG tablet Take 1 tablet daily    phentermine (37.5 MG tablet Take 1/2 to 1 tablet every Morning   thiamine (VIT  B-1) 50 MG tablet Take 5 daily.   topiramate (TOPAMAX) 50 MG tablet Take 1/2 to 1 tablet 2 x /day   vitamin B-6 ( 25 MG tablet Take daily.     Allergies  Allergen Reactions   Lotensin [Benazepril Hcl]     unknown   Penicillins Rash     PMHx:   Past Medical History:  Diagnosis Date   BPH with elevated PSA    Cataracts, bilateral    Diverticulosis    Gout    Hx of adenomatous colonic polyps 06/18/2014   Hyperlipidemia    Hypertension    Prediabetes    Thyroid disease    Tubular adenoma 02/17/2003   Umbilical hernia 04/21/2014      Immunization History  Administered Date(s) Administered   Influenza, High Dose  03/27/2022   Influenza  01/27/2018   PFIZER SARS-COV-2 Vacc 07/04/2019, 07/29/2019   Pneumococcal -23 08/19/2008, 12/23/2020   Td 09/07/2009     Past Surgical History:  Procedure Laterality Date   CATARACT EXTRACTION Right 2009   COLONOSCOPY W/ BIOPSIES     SHOULDER ARTHROSCOPY Right    1979     FHx:    Reviewed / unchanged   SHx:    Reviewed / unchanged    Systems Review:  Constitutional: Denies fever, chills, wt changes, headaches,  insomnia, fatigue, night sweats, change in appetite. Eyes: Denies redness, blurred vision, diplopia, discharge, itchy, watery eyes.  ENT: Denies discharge, congestion, post nasal drip, epistaxis, sore throat, earache, hearing loss, dental pain, tinnitus, vertigo, sinus pain, snoring.  CV: Denies chest pain, palpitations, irregular heartbeat, syncope, dyspnea, diaphoresis, orthopnea, PND, claudication or edema. Respiratory: denies cough, dyspnea, DOE, pleurisy, hoarseness, laryngitis, wheezing.  Gastrointestinal: Denies dysphagia, odynophagia, heartburn, reflux, water brash, abdominal pain or cramps, nausea, vomiting, bloating, diarrhea, constipation, hematemesis, melena, hematochezia  or hemorrhoids. Genitourinary: Denies dysuria, frequency, urgency, nocturia, hesitancy, discharge, hematuria or flank pain. Musculoskeletal: Denies arthralgias, myalgias, stiffness, jt. swelling, pain, limping or strain/sprain.  Skin: Denies pruritus, rash, hives, warts, acne, eczema or change in skin lesion(s). Neuro: No weakness, tremor, incoordination, spasms, paresthesia or pain. Psychiatric: Denies confusion, memory loss or sensory loss. Endo: Denies change in weight, skin or hair change.  Heme/Lymph: No excessive bleeding, bruising or enlarged lymph nodes.   Physical Exam  BP 136/76   Pulse 93   Temp 97.9 F (36.6 C)   Resp 16   Ht 5\' 6"  (1.676 m)   Wt 200 lb 12.8 oz (91.1 kg)   SpO2 99%   BMI 32.41 kg/m   Appears  well nourished, well groomed  and in no distress.  Eyes: PERRLA, EOMs, conjunctiva no swelling or erythema. Sinuses: No frontal/maxillary tenderness ENT/Mouth:  EAC's clear, TM's nl w/o erythema, bulging. Nares clear w/o erythema, swelling, exudates. Oropharynx clear without erythema or exudates. Oral hygiene is good. Tongue normal, non obstructing. Hearing intact.  Neck: Supple. Thyroid not palpable. Car 2+/2+ without bruits, nodes or JVD. Chest: Respirations nl with BS clear & equal  w/o rales, rhonchi, wheezing or stridor.  Cor: Heart sounds normal w/ regular rate and rhythm without sig. murmurs, gallops, clicks or rubs. Peripheral pulses normal and equal  without edema.  Abdomen: Soft & bowel sounds normal. Non-tender w/o guarding, rebound, hernias, masses or organomegaly.  Lymphatics: Unremarkable.  Musculoskeletal: Full ROM all peripheral extremities, joint stability, 5/5 strength and normal gait.  Skin: Warm, dry without exposed rashes, lesions or ecchymosis apparent.  Neuro: Cranial nerves intact, reflexes equal bilaterally. Sensory-motor testing grossly intact. Tendon reflexes grossly intact.  Pysch: Alert & oriented x 3.  Insight and judgement nl & appropriate. No ideations.   Assessment and Plan:  1. Essential hypertension  - Continue medication, monitor blood pressure at home.  - Continue DASH diet.  Reminder to go to the ER if any CP,  SOB, nausea, dizziness, severe HA, changes vision/speech.    - CBC with Differential/Platelet - COMPLETE METABOLIC PANEL WITH GFR - Magnesium - TSH  2. Hyperlipidemia, mixed  - Continue diet/meds, exercise,& lifestyle modifications.  - Continue monitor periodic cholesterol/liver & renal functions     - Lipid panel - TSH  3. Abnormal glucose  - Continue diet, exercise  - Lifestyle modifications.  - Monitor appropriate labs    - Hemoglobin A1c - Insulin, random  4. Vitamin D deficiency  - Continue supplementation.    - VITAMIN D 25 Hydroxy  5. Hypothyroidism  - TSH  6. Aortic atherosclerosis (HCC)  - Lipid panel  7. Medication management  - CBC with Differential/Platelet - COMPLETE METABOLIC PANEL WITH GFR - Magnesium - Lipid panel - TSH - Hemoglobin A1c - Insulin, random - VITAMIN D 25 Hydroxy          Discussed  regular exercise, BP monitoring, weight control to achieve/maintain BMI less than 25 and discussed med and SE's. Recommended labs to assess /monitor clinical status .  I discussed  the assessment and treatment plan with the patient. The patient was provided an opportunity to ask questions and all were answered. The patient agreed with the plan and demonstrated an understanding of the instructions.  I provided over 30 minutes of exam, counseling, chart review and  complex critical decision making.        The patient was advised to call back or seek an in-person evaluation if the symptoms worsen or if the condition fails to improve as anticipated.   Marinus Maw, MD

## 2022-11-22 LAB — LIPID PANEL
Cholesterol: 183 mg/dL (ref <200)
HDL: 40 mg/dL (ref >=40)
LDL Cholesterol (Calc): 112 mg/dL (calc) — ABNORMAL HIGH
Non-HDL Cholesterol (Calc): 143 mg/dL (calc) — ABNORMAL HIGH (ref <130)
Total CHOL/HDL Ratio: 4.6 (calc) (ref <5.0)
Triglycerides: 194 mg/dL — ABNORMAL HIGH (ref <150)

## 2022-11-22 LAB — COMPLETE METABOLIC PANEL WITH GFR
AG Ratio: 1.4 (calc) (ref 1.0–2.5)
ALT: 25 U/L (ref 9–46)
AST: 29 U/L (ref 10–35)
Albumin: 3.9 g/dL (ref 3.6–5.1)
Alkaline phosphatase (APISO): 93 U/L (ref 35–144)
BUN: 17 mg/dL (ref 7–25)
CO2: 31 mmol/L (ref 20–32)
Calcium: 9.3 mg/dL (ref 8.6–10.3)
Chloride: 106 mmol/L (ref 98–110)
Creat: 1.18 mg/dL (ref 0.70–1.22)
Globulin: 2.7 g/dL (calc) (ref 1.9–3.7)
Glucose, Bld: 109 mg/dL — ABNORMAL HIGH (ref 65–99)
Potassium: 4.2 mmol/L (ref 3.5–5.3)
Sodium: 146 mmol/L (ref 135–146)
Total Bilirubin: 0.6 mg/dL (ref 0.2–1.2)
Total Protein: 6.6 g/dL (ref 6.1–8.1)
eGFR: 62 mL/min/{1.73_m2} (ref >=60)

## 2022-11-22 LAB — INSULIN, RANDOM: Insulin: 28.2 u[IU]/mL — ABNORMAL HIGH

## 2022-11-22 LAB — CBC WITH DIFFERENTIAL/PLATELET
Basophils Relative: 0.8 %
Eosinophils Relative: 2.9 %
HCT: 40.2 % (ref 38.5–50.0)
Hemoglobin: 13.9 g/dL (ref 13.2–17.1)
Lymphs Abs: 1250 cells/uL (ref 850–3900)
MCH: 31.7 pg (ref 27.0–33.0)
MCV: 91.8 fL (ref 80.0–100.0)
Monocytes Relative: 7.5 %
Neutrophils Relative %: 64.3 %
Platelets: 192 10*3/uL (ref 140–400)
RBC: 4.38 10*6/uL (ref 4.20–5.80)
RDW: 12.6 % (ref 11.0–15.0)
Total Lymphocyte: 24.5 %

## 2022-11-22 LAB — VITAMIN D 25 HYDROXY (VIT D DEFICIENCY, FRACTURES): Vit D, 25-Hydroxy: 55 ng/mL (ref 30–100)

## 2022-11-22 LAB — HEMOGLOBIN A1C
Hgb A1c MFr Bld: 5.2 % of total Hgb (ref <5.7)
Mean Plasma Glucose: 103 mg/dL
eAG (mmol/L): 5.7 mmol/L

## 2022-11-22 LAB — TSH: TSH: 3.56 mIU/L (ref 0.40–4.50)

## 2022-11-22 LAB — MAGNESIUM: Magnesium: 2.4 mg/dL (ref 1.5–2.5)

## 2022-11-22 NOTE — Progress Notes (Signed)
^<^<^<^<^<^<^<^<^<^<^<^<^<^<^<^<^<^<^<^<^<^<^<^<^<^<^<^<^<^<^<^<^<^<^<^<^ ^>^>^>^>^>^>^>^>^>^>^>>^>^>^>^>^>^>^>^>^>^>^>^>^>^>^>^>^>^>^>^>^>^>^>^>^>  -Test results slightly outside the reference range are not unusual. If there is anything important, I will review this with you,  otherwise it is considered normal test values.  If you have further questions,  please do not hesitate to contact me at the office or via My Chart.   ^<^<^<^<^<^<^<^<^<^<^<^<^<^<^<^<^<^<^<^<^<^<^<^<^<^<^<^<^<^<^<^<^<^<^<^<^ ^>^>^>^>^>^>^>^>^>^>^>^>^>^>^>^>^>^>^>^>^>^>^>^>^>^>^>^>^>^>^>^>^>^>^>^>^  -  Total  Chol =    183    -   is slightly Elevated             (  Ideal  or  Goal is less than 180  !  )  & -  Bad / Dangerous LDL  Chol =   112    -  is  also Elevated              (  Ideal  or  Goal is less than 70  !  )    - Cholesterol is too high - Recommend a stricter  low cholesterol diet   - Cholesterol only comes from animal sources                                                                               - ie. meat, dairy, egg yolks  - Eat all the vegetables you want.                                        - Avoid Meat, Avoid Meat,  Avoid Meat                                                                        - especially Red Meat - Beef AND Pork .  - Avoid cheese & dairy - milk & ice cream.     - Cheese is the most concentrated form of trans-fats which                                                                          is the worst thing to clog up our arteries.    - Veggie cheese is OK which can be found in the fresh                                       produce section at [employer] or [employer] or [employer]  ^>^>^>^>^>^>^>^>^>^>^>^>^>^>^>^>^>^>^>^>^>^>^>^>^>^>^>^>^>^>^>^>^>^>^>^>^  -  A1c - Normal -No Diabetes   ^>^>^>^>^>^>^>^>^>^>^>^>^>^>^>^>^>^>^>^>^>^>^>^>^>^>^>^>^>^>^>^>^>^>^>^>^  -  All Else - CBC - Kidneys - Electrolytes - Liver - Magnesium & Thyroid    -  all   Normal / OK  ^>^>^>^>^>^>^>^>^>^>^>^>^>^>^>^>^>^>^>^>^>^>^>^>^>^>^>^>^>^>^>^>^>^>^>^>^ ^>^>^>^>^>^>^>^>^>^>^>^>^>^>^>^>^>^>^>^>^>^>^>^>^>^>^>^>^>^>^>^>^>^>^>^>^

## 2022-11-28 ENCOUNTER — Other Ambulatory Visit: Payer: Self-pay

## 2022-11-28 DIAGNOSIS — E782 Mixed hyperlipidemia: Secondary | ICD-10-CM

## 2022-11-28 MED ORDER — EZETIMIBE 10 MG PO TABS
ORAL_TABLET | ORAL | 3 refills | Status: AC
Start: 2022-11-28 — End: ?

## 2022-12-26 ENCOUNTER — Ambulatory Visit: Payer: Medicare HMO | Admitting: Nurse Practitioner

## 2023-01-08 ENCOUNTER — Ambulatory Visit: Payer: Medicare PPO | Admitting: Nurse Practitioner

## 2023-02-02 DIAGNOSIS — Z13220 Encounter for screening for lipoid disorders: Secondary | ICD-10-CM | POA: Diagnosis not present

## 2023-02-02 DIAGNOSIS — Z136 Encounter for screening for cardiovascular disorders: Secondary | ICD-10-CM | POA: Diagnosis not present

## 2023-02-02 DIAGNOSIS — I10 Essential (primary) hypertension: Secondary | ICD-10-CM | POA: Diagnosis not present

## 2023-02-02 DIAGNOSIS — E038 Other specified hypothyroidism: Secondary | ICD-10-CM | POA: Diagnosis not present

## 2023-02-02 DIAGNOSIS — R42 Dizziness and giddiness: Secondary | ICD-10-CM | POA: Diagnosis not present

## 2023-02-07 DIAGNOSIS — I4510 Unspecified right bundle-branch block: Secondary | ICD-10-CM | POA: Diagnosis not present

## 2023-02-07 DIAGNOSIS — M6281 Muscle weakness (generalized): Secondary | ICD-10-CM | POA: Diagnosis not present

## 2023-02-07 DIAGNOSIS — M47812 Spondylosis without myelopathy or radiculopathy, cervical region: Secondary | ICD-10-CM | POA: Diagnosis not present

## 2023-02-07 DIAGNOSIS — M47813 Spondylosis without myelopathy or radiculopathy, cervicothoracic region: Secondary | ICD-10-CM | POA: Diagnosis not present

## 2023-02-07 DIAGNOSIS — M542 Cervicalgia: Secondary | ICD-10-CM | POA: Diagnosis not present

## 2023-02-07 DIAGNOSIS — I69991 Dysphagia following unspecified cerebrovascular disease: Secondary | ICD-10-CM | POA: Diagnosis not present

## 2023-02-07 DIAGNOSIS — R29898 Other symptoms and signs involving the musculoskeletal system: Secondary | ICD-10-CM | POA: Diagnosis not present

## 2023-02-07 DIAGNOSIS — E038 Other specified hypothyroidism: Secondary | ICD-10-CM | POA: Diagnosis not present

## 2023-02-07 DIAGNOSIS — N3001 Acute cystitis with hematuria: Secondary | ICD-10-CM | POA: Diagnosis not present

## 2023-02-07 DIAGNOSIS — R000 Tachycardia, unspecified: Secondary | ICD-10-CM | POA: Diagnosis not present

## 2023-02-07 DIAGNOSIS — W19XXXA Unspecified fall, initial encounter: Secondary | ICD-10-CM | POA: Diagnosis not present

## 2023-02-07 DIAGNOSIS — R55 Syncope and collapse: Secondary | ICD-10-CM | POA: Diagnosis not present

## 2023-02-07 DIAGNOSIS — Z789 Other specified health status: Secondary | ICD-10-CM | POA: Diagnosis not present

## 2023-02-07 DIAGNOSIS — R531 Weakness: Secondary | ICD-10-CM | POA: Diagnosis not present

## 2023-02-07 DIAGNOSIS — I639 Cerebral infarction, unspecified: Secondary | ICD-10-CM | POA: Diagnosis not present

## 2023-02-07 DIAGNOSIS — I6389 Other cerebral infarction: Secondary | ICD-10-CM | POA: Diagnosis not present

## 2023-02-07 DIAGNOSIS — I69320 Aphasia following cerebral infarction: Secondary | ICD-10-CM | POA: Diagnosis not present

## 2023-02-07 DIAGNOSIS — R4182 Altered mental status, unspecified: Secondary | ICD-10-CM | POA: Diagnosis not present

## 2023-02-07 DIAGNOSIS — R471 Dysarthria and anarthria: Secondary | ICD-10-CM | POA: Diagnosis not present

## 2023-02-07 DIAGNOSIS — R296 Repeated falls: Secondary | ICD-10-CM | POA: Diagnosis not present

## 2023-02-07 DIAGNOSIS — M25521 Pain in right elbow: Secondary | ICD-10-CM | POA: Diagnosis not present

## 2023-02-07 DIAGNOSIS — R58 Hemorrhage, not elsewhere classified: Secondary | ICD-10-CM | POA: Diagnosis not present

## 2023-02-07 DIAGNOSIS — R269 Unspecified abnormalities of gait and mobility: Secondary | ICD-10-CM | POA: Diagnosis not present

## 2023-02-07 DIAGNOSIS — Z743 Need for continuous supervision: Secondary | ICD-10-CM | POA: Diagnosis not present

## 2023-02-07 DIAGNOSIS — I708 Atherosclerosis of other arteries: Secondary | ICD-10-CM | POA: Diagnosis not present

## 2023-02-07 DIAGNOSIS — Z4659 Encounter for fitting and adjustment of other gastrointestinal appliance and device: Secondary | ICD-10-CM | POA: Diagnosis not present

## 2023-02-07 DIAGNOSIS — I651 Occlusion and stenosis of basilar artery: Secondary | ICD-10-CM | POA: Diagnosis not present

## 2023-02-07 DIAGNOSIS — N390 Urinary tract infection, site not specified: Secondary | ICD-10-CM | POA: Diagnosis not present

## 2023-02-07 DIAGNOSIS — Z66 Do not resuscitate: Secondary | ICD-10-CM | POA: Diagnosis not present

## 2023-02-07 DIAGNOSIS — R5381 Other malaise: Secondary | ICD-10-CM | POA: Diagnosis not present

## 2023-02-07 DIAGNOSIS — R509 Fever, unspecified: Secondary | ICD-10-CM | POA: Diagnosis not present

## 2023-02-07 DIAGNOSIS — M4854XA Collapsed vertebra, not elsewhere classified, thoracic region, initial encounter for fracture: Secondary | ICD-10-CM | POA: Diagnosis not present

## 2023-02-07 DIAGNOSIS — R4701 Aphasia: Secondary | ICD-10-CM | POA: Diagnosis not present

## 2023-02-07 DIAGNOSIS — Z736 Limitation of activities due to disability: Secondary | ICD-10-CM | POA: Diagnosis not present

## 2023-02-07 DIAGNOSIS — R059 Cough, unspecified: Secondary | ICD-10-CM | POA: Diagnosis not present

## 2023-02-07 DIAGNOSIS — R0602 Shortness of breath: Secondary | ICD-10-CM | POA: Diagnosis not present

## 2023-02-07 DIAGNOSIS — J690 Pneumonitis due to inhalation of food and vomit: Secondary | ICD-10-CM | POA: Diagnosis not present

## 2023-02-07 DIAGNOSIS — S199XXA Unspecified injury of neck, initial encounter: Secondary | ICD-10-CM | POA: Diagnosis not present

## 2023-02-07 DIAGNOSIS — I10 Essential (primary) hypertension: Secondary | ICD-10-CM | POA: Diagnosis not present

## 2023-02-07 DIAGNOSIS — R627 Adult failure to thrive: Secondary | ICD-10-CM | POA: Diagnosis not present

## 2023-02-07 DIAGNOSIS — E440 Moderate protein-calorie malnutrition: Secondary | ICD-10-CM | POA: Diagnosis not present

## 2023-02-07 DIAGNOSIS — G8194 Hemiplegia, unspecified affecting left nondominant side: Secondary | ICD-10-CM | POA: Diagnosis not present

## 2023-02-07 DIAGNOSIS — R1312 Dysphagia, oropharyngeal phase: Secondary | ICD-10-CM | POA: Diagnosis not present

## 2023-02-07 DIAGNOSIS — R41841 Cognitive communication deficit: Secondary | ICD-10-CM | POA: Diagnosis not present

## 2023-02-07 DIAGNOSIS — W1830XA Fall on same level, unspecified, initial encounter: Secondary | ICD-10-CM | POA: Diagnosis not present

## 2023-02-07 DIAGNOSIS — Z7409 Other reduced mobility: Secondary | ICD-10-CM | POA: Diagnosis not present

## 2023-02-19 DIAGNOSIS — W1830XA Fall on same level, unspecified, initial encounter: Secondary | ICD-10-CM | POA: Diagnosis not present

## 2023-02-19 DIAGNOSIS — E440 Moderate protein-calorie malnutrition: Secondary | ICD-10-CM | POA: Diagnosis not present

## 2023-02-19 DIAGNOSIS — I10 Essential (primary) hypertension: Secondary | ICD-10-CM | POA: Diagnosis not present

## 2023-02-19 DIAGNOSIS — F03A0 Unspecified dementia, mild, without behavioral disturbance, psychotic disturbance, mood disturbance, and anxiety: Secondary | ICD-10-CM | POA: Diagnosis not present

## 2023-02-19 DIAGNOSIS — R1310 Dysphagia, unspecified: Secondary | ICD-10-CM | POA: Diagnosis not present

## 2023-02-19 DIAGNOSIS — R269 Unspecified abnormalities of gait and mobility: Secondary | ICD-10-CM | POA: Diagnosis not present

## 2023-02-19 DIAGNOSIS — F32A Depression, unspecified: Secondary | ICD-10-CM | POA: Diagnosis not present

## 2023-02-19 DIAGNOSIS — R4182 Altered mental status, unspecified: Secondary | ICD-10-CM | POA: Diagnosis not present

## 2023-02-19 DIAGNOSIS — R55 Syncope and collapse: Secondary | ICD-10-CM | POA: Diagnosis not present

## 2023-02-19 DIAGNOSIS — M6281 Muscle weakness (generalized): Secondary | ICD-10-CM | POA: Diagnosis not present

## 2023-02-19 DIAGNOSIS — Z743 Need for continuous supervision: Secondary | ICD-10-CM | POA: Diagnosis not present

## 2023-02-19 DIAGNOSIS — F331 Major depressive disorder, recurrent, moderate: Secondary | ICD-10-CM | POA: Diagnosis not present

## 2023-02-19 DIAGNOSIS — R1312 Dysphagia, oropharyngeal phase: Secondary | ICD-10-CM | POA: Diagnosis not present

## 2023-02-19 DIAGNOSIS — E038 Other specified hypothyroidism: Secondary | ICD-10-CM | POA: Diagnosis not present

## 2023-02-19 DIAGNOSIS — R296 Repeated falls: Secondary | ICD-10-CM | POA: Diagnosis not present

## 2023-02-19 DIAGNOSIS — I639 Cerebral infarction, unspecified: Secondary | ICD-10-CM | POA: Diagnosis not present

## 2023-02-19 DIAGNOSIS — Z8673 Personal history of transient ischemic attack (TIA), and cerebral infarction without residual deficits: Secondary | ICD-10-CM | POA: Diagnosis not present

## 2023-02-19 DIAGNOSIS — R4701 Aphasia: Secondary | ICD-10-CM | POA: Diagnosis not present

## 2023-02-19 DIAGNOSIS — R41841 Cognitive communication deficit: Secondary | ICD-10-CM | POA: Diagnosis not present

## 2023-02-19 DIAGNOSIS — B370 Candidal stomatitis: Secondary | ICD-10-CM | POA: Diagnosis not present

## 2023-02-19 DIAGNOSIS — Z736 Limitation of activities due to disability: Secondary | ICD-10-CM | POA: Diagnosis not present

## 2023-02-19 DIAGNOSIS — I651 Occlusion and stenosis of basilar artery: Secondary | ICD-10-CM | POA: Diagnosis not present

## 2023-02-19 DIAGNOSIS — R262 Difficulty in walking, not elsewhere classified: Secondary | ICD-10-CM | POA: Diagnosis not present

## 2023-02-20 DIAGNOSIS — E440 Moderate protein-calorie malnutrition: Secondary | ICD-10-CM | POA: Diagnosis not present

## 2023-02-20 DIAGNOSIS — B370 Candidal stomatitis: Secondary | ICD-10-CM | POA: Diagnosis not present

## 2023-02-20 DIAGNOSIS — F32A Depression, unspecified: Secondary | ICD-10-CM | POA: Diagnosis not present

## 2023-02-20 DIAGNOSIS — I10 Essential (primary) hypertension: Secondary | ICD-10-CM | POA: Diagnosis not present

## 2023-02-20 DIAGNOSIS — I651 Occlusion and stenosis of basilar artery: Secondary | ICD-10-CM | POA: Diagnosis not present

## 2023-02-20 DIAGNOSIS — R1310 Dysphagia, unspecified: Secondary | ICD-10-CM | POA: Diagnosis not present

## 2023-02-20 DIAGNOSIS — M6281 Muscle weakness (generalized): Secondary | ICD-10-CM | POA: Diagnosis not present

## 2023-02-20 DIAGNOSIS — R262 Difficulty in walking, not elsewhere classified: Secondary | ICD-10-CM | POA: Diagnosis not present

## 2023-02-20 DIAGNOSIS — R55 Syncope and collapse: Secondary | ICD-10-CM | POA: Diagnosis not present

## 2023-02-21 ENCOUNTER — Ambulatory Visit: Payer: Medicare PPO | Admitting: Nurse Practitioner

## 2023-02-21 DIAGNOSIS — Z8673 Personal history of transient ischemic attack (TIA), and cerebral infarction without residual deficits: Secondary | ICD-10-CM | POA: Diagnosis not present

## 2023-02-21 DIAGNOSIS — F03A0 Unspecified dementia, mild, without behavioral disturbance, psychotic disturbance, mood disturbance, and anxiety: Secondary | ICD-10-CM | POA: Diagnosis not present

## 2023-02-21 DIAGNOSIS — E440 Moderate protein-calorie malnutrition: Secondary | ICD-10-CM | POA: Diagnosis not present

## 2023-02-21 DIAGNOSIS — R262 Difficulty in walking, not elsewhere classified: Secondary | ICD-10-CM | POA: Diagnosis not present

## 2023-02-21 DIAGNOSIS — R1310 Dysphagia, unspecified: Secondary | ICD-10-CM | POA: Diagnosis not present

## 2023-02-21 DIAGNOSIS — I651 Occlusion and stenosis of basilar artery: Secondary | ICD-10-CM | POA: Diagnosis not present

## 2023-02-21 DIAGNOSIS — B370 Candidal stomatitis: Secondary | ICD-10-CM | POA: Diagnosis not present

## 2023-02-21 DIAGNOSIS — F331 Major depressive disorder, recurrent, moderate: Secondary | ICD-10-CM | POA: Diagnosis not present

## 2023-02-21 DIAGNOSIS — R55 Syncope and collapse: Secondary | ICD-10-CM | POA: Diagnosis not present

## 2023-02-21 DIAGNOSIS — R296 Repeated falls: Secondary | ICD-10-CM | POA: Diagnosis not present

## 2023-02-21 DIAGNOSIS — I10 Essential (primary) hypertension: Secondary | ICD-10-CM | POA: Diagnosis not present

## 2023-02-23 DIAGNOSIS — R262 Difficulty in walking, not elsewhere classified: Secondary | ICD-10-CM | POA: Diagnosis not present

## 2023-02-23 DIAGNOSIS — I10 Essential (primary) hypertension: Secondary | ICD-10-CM | POA: Diagnosis not present

## 2023-02-23 DIAGNOSIS — Z8673 Personal history of transient ischemic attack (TIA), and cerebral infarction without residual deficits: Secondary | ICD-10-CM | POA: Diagnosis not present

## 2023-02-23 DIAGNOSIS — R55 Syncope and collapse: Secondary | ICD-10-CM | POA: Diagnosis not present

## 2023-02-23 DIAGNOSIS — R1310 Dysphagia, unspecified: Secondary | ICD-10-CM | POA: Diagnosis not present

## 2023-02-23 DIAGNOSIS — R296 Repeated falls: Secondary | ICD-10-CM | POA: Diagnosis not present

## 2023-02-23 DIAGNOSIS — I651 Occlusion and stenosis of basilar artery: Secondary | ICD-10-CM | POA: Diagnosis not present

## 2023-02-23 DIAGNOSIS — E440 Moderate protein-calorie malnutrition: Secondary | ICD-10-CM | POA: Diagnosis not present

## 2023-02-23 DIAGNOSIS — B370 Candidal stomatitis: Secondary | ICD-10-CM | POA: Diagnosis not present

## 2023-02-26 DIAGNOSIS — I10 Essential (primary) hypertension: Secondary | ICD-10-CM | POA: Diagnosis not present

## 2023-02-26 DIAGNOSIS — R55 Syncope and collapse: Secondary | ICD-10-CM | POA: Diagnosis not present

## 2023-02-26 DIAGNOSIS — R296 Repeated falls: Secondary | ICD-10-CM | POA: Diagnosis not present

## 2023-02-26 DIAGNOSIS — F32A Depression, unspecified: Secondary | ICD-10-CM | POA: Diagnosis not present

## 2023-02-26 DIAGNOSIS — R262 Difficulty in walking, not elsewhere classified: Secondary | ICD-10-CM | POA: Diagnosis not present

## 2023-02-26 DIAGNOSIS — B370 Candidal stomatitis: Secondary | ICD-10-CM | POA: Diagnosis not present

## 2023-02-26 DIAGNOSIS — I651 Occlusion and stenosis of basilar artery: Secondary | ICD-10-CM | POA: Diagnosis not present

## 2023-02-26 DIAGNOSIS — R1310 Dysphagia, unspecified: Secondary | ICD-10-CM | POA: Diagnosis not present

## 2023-02-26 DIAGNOSIS — Z8673 Personal history of transient ischemic attack (TIA), and cerebral infarction without residual deficits: Secondary | ICD-10-CM | POA: Diagnosis not present

## 2023-03-29 ENCOUNTER — Other Ambulatory Visit: Payer: Self-pay | Admitting: Internal Medicine

## 2023-04-02 ENCOUNTER — Encounter: Payer: Medicare PPO | Admitting: Internal Medicine

## 2023-04-10 ENCOUNTER — Encounter: Payer: Medicare PPO | Admitting: Internal Medicine

## 2023-06-05 ENCOUNTER — Encounter: Payer: Medicare PPO | Admitting: Internal Medicine

## 2023-07-10 ENCOUNTER — Ambulatory Visit: Payer: Medicare PPO | Admitting: Nurse Practitioner

## 2023-08-02 ENCOUNTER — Encounter: Payer: Self-pay | Admitting: *Deleted

## 2023-08-03 ENCOUNTER — Other Ambulatory Visit: Payer: Self-pay

## 2023-08-03 DIAGNOSIS — E66812 Obesity, class 2: Secondary | ICD-10-CM

## 2023-08-03 MED ORDER — PHENTERMINE HCL 37.5 MG PO TABS
ORAL_TABLET | ORAL | 0 refills | Status: AC
Start: 2023-08-03 — End: ?

## 2023-09-11 ENCOUNTER — Ambulatory Visit: Payer: Medicare PPO | Admitting: Nurse Practitioner
